# Patient Record
Sex: Female | Born: 1950 | Race: White | Hispanic: No | Marital: Married | Smoking: Former smoker
Health system: Southern US, Community
[De-identification: ages and names within clinical notes are randomized; demographics above are authoritative.]

## PROBLEM LIST (undated history)

## (undated) DIAGNOSIS — M1990 Unspecified osteoarthritis, unspecified site: Secondary | ICD-10-CM

## (undated) DIAGNOSIS — T7840XA Allergy, unspecified, initial encounter: Secondary | ICD-10-CM

## (undated) DIAGNOSIS — I10 Essential (primary) hypertension: Secondary | ICD-10-CM

## (undated) DIAGNOSIS — R42 Dizziness and giddiness: Secondary | ICD-10-CM

## (undated) DIAGNOSIS — E079 Disorder of thyroid, unspecified: Secondary | ICD-10-CM

## (undated) HISTORY — DX: Allergy, unspecified, initial encounter: T78.40XA

## (undated) HISTORY — DX: Unspecified osteoarthritis, unspecified site: M19.90

## (undated) HISTORY — DX: Essential (primary) hypertension: I10

## (undated) HISTORY — PX: GASTRIC BYPASS: SHX52

## (undated) HISTORY — DX: Disorder of thyroid, unspecified: E07.9

## (undated) HISTORY — DX: Dizziness and giddiness: R42

## (undated) HISTORY — PX: TUBAL LIGATION: SHX77

## (undated) HISTORY — PX: CHOLECYSTECTOMY: SHX55

## (undated) HISTORY — PX: FOOT SURGERY: SHX648

---

## 2001-07-19 ENCOUNTER — Ambulatory Visit (HOSPITAL_BASED_OUTPATIENT_CLINIC_OR_DEPARTMENT_OTHER): Admission: RE | Admit: 2001-07-19 | Discharge: 2001-07-19 | Payer: Self-pay | Admitting: Family Medicine

## 2003-05-22 ENCOUNTER — Ambulatory Visit (HOSPITAL_COMMUNITY): Admission: RE | Admit: 2003-05-22 | Discharge: 2003-05-22 | Payer: Self-pay | Admitting: Family Medicine

## 2003-06-06 ENCOUNTER — Observation Stay (HOSPITAL_COMMUNITY): Admission: RE | Admit: 2003-06-06 | Discharge: 2003-06-07 | Payer: Self-pay | Admitting: General Surgery

## 2004-09-24 IMAGING — RF DG CHOLANGIOGRAM OPERATIVE
1 series · 4 of 4 positions shown · non-contrast
Comparison: none

CLINICAL DATA: gallstones
 OPERATIVE CHOLANGIOGRAM
 Common duct is normal in caliber without evidence of retained calculi.  There is drainage into the duodenum.  No extravasation is seen.  
 IMPRESSION
 No evidence of common bile duct obstruction or choledocholithiasis.

[Series 1: run · 4 of 4 slices shown]
[im 1/4]
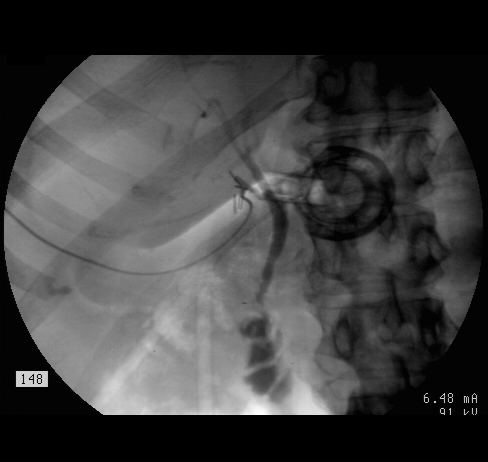
[im 2/4]
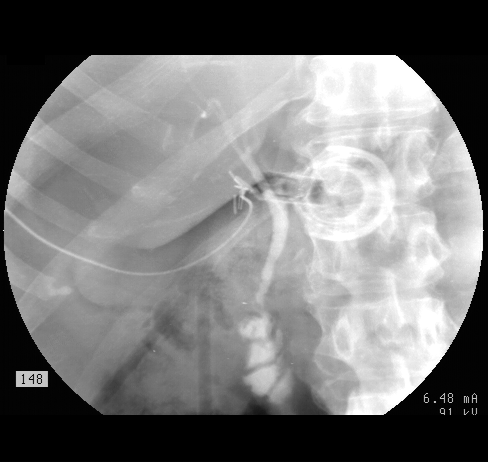
[im 3/4]
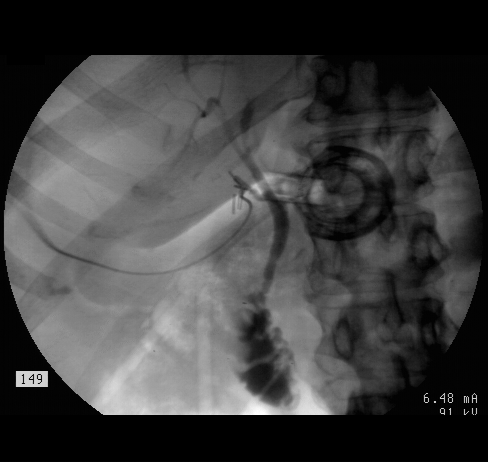
[im 4/4]
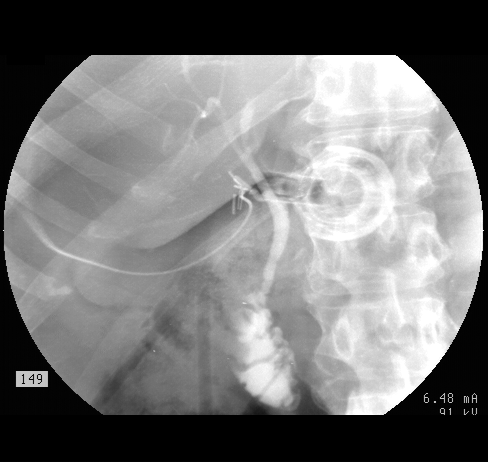

[4 of 4 positions shown; findings below may reference images not displayed]

## 2012-05-13 ENCOUNTER — Other Ambulatory Visit: Payer: Self-pay | Admitting: Family Medicine

## 2012-05-13 ENCOUNTER — Other Ambulatory Visit (HOSPITAL_COMMUNITY)
Admission: RE | Admit: 2012-05-13 | Discharge: 2012-05-13 | Disposition: A | Discharge status: Home / Self Care | Payer: Self-pay | Source: Ambulatory Visit | Attending: Family Medicine | Admitting: Family Medicine

## 2012-05-13 DIAGNOSIS — Z0000 Encounter for general adult medical examination without abnormal findings: Secondary | ICD-10-CM | POA: Insufficient documentation

## 2013-08-25 ENCOUNTER — Ambulatory Visit (INDEPENDENT_AMBULATORY_CARE_PROVIDER_SITE_OTHER): Payer: No Typology Code available for payment source | Admitting: Podiatry

## 2013-08-25 ENCOUNTER — Ambulatory Visit (INDEPENDENT_AMBULATORY_CARE_PROVIDER_SITE_OTHER): Payer: No Typology Code available for payment source

## 2013-08-25 ENCOUNTER — Ambulatory Visit: Payer: Self-pay | Admitting: Podiatry

## 2013-08-25 ENCOUNTER — Encounter: Payer: Self-pay | Admitting: Podiatry

## 2013-08-25 VITALS — BP 125/77 | HR 80 | Resp 15 | Ht 68.0 in | Wt 250.0 lb

## 2013-08-25 DIAGNOSIS — M79673 Pain in unspecified foot: Secondary | ICD-10-CM

## 2013-08-25 DIAGNOSIS — M722 Plantar fascial fibromatosis: Secondary | ICD-10-CM

## 2013-08-25 DIAGNOSIS — M79609 Pain in unspecified limb: Secondary | ICD-10-CM

## 2013-08-25 MED ORDER — TRIAMCINOLONE ACETONIDE 10 MG/ML IJ SUSP
10.0000 mg | Freq: Once | INTRAMUSCULAR | Status: AC
  Administered 2013-08-25: 10 mg

## 2013-08-25 NOTE — Progress Notes (Signed)
   Subjective:    Patient ID: Janet Leblanc, female    DOB: [birth year], 63 y.o.   MRN: [id number]  HPI Comments: Pt complains of arch and heel pain for about 2 month, once she began working again.  Pt states she stands 6 to 10 hours/day.  Pt states the pain is a aching, to throbbing, and worsens after rest and at the end of the day.  Pt soaks feet in Epsom salt soaks, OTC inserts, and a arch brace.     Review of Systems  HENT: Positive for sinus pressure and tinnitus.   Allergic/Immunologic: Positive for environmental allergies.  All other systems reviewed and are negative.      Objective:   Physical Exam        Assessment & Plan:

## 2013-08-25 NOTE — Progress Notes (Signed)
Subjective:     Patient ID: Janet Leblanc, female   DOB: 07-Feb-1951, 63 y.o.   MRN: [id number]  HPI patient works at a full-time weightbearing job now and is developed pain in her heels but is now spreading to her arches. Has tried soaks and over-the-counter insoles and arch brace   Review of Systems  All other systems reviewed and are negative.      Objective:   Physical Exam  Nursing note and vitals reviewed. Constitutional: She is oriented to person, place, and time.  Cardiovascular: Intact distal pulses.   Musculoskeletal: Normal range of motion.  Neurological: She is oriented to person, place, and time.  Skin: Skin is warm.   neurovascular status intact with muscle strength adequate and range of motion within normal limits. Patient has intense discomfort plantar aspect heel left over right with inflammation at the insertion of the tendon into the calcaneus     Assessment:     Acute plantar fasciitis both heels left worse than right    Plan:     H&P and x-rays reviewed and injected the plantar fascia both heels 3 mg Kenalog 5 mg Xylocaine Marcaine mixture and applied fascially brace left. Placed on Voltaren 75 mg twice a day

## 2013-09-01 ENCOUNTER — Ambulatory Visit (INDEPENDENT_AMBULATORY_CARE_PROVIDER_SITE_OTHER): Payer: No Typology Code available for payment source | Admitting: Podiatry

## 2013-09-01 ENCOUNTER — Encounter: Payer: Self-pay | Admitting: Podiatry

## 2013-09-01 VITALS — BP 102/83 | HR 87 | Resp 16

## 2013-09-01 DIAGNOSIS — M722 Plantar fascial fibromatosis: Secondary | ICD-10-CM

## 2013-09-01 MED ORDER — TRIAMCINOLONE ACETONIDE 10 MG/ML IJ SUSP
10.0000 mg | Freq: Once | INTRAMUSCULAR | Status: AC
  Administered 2013-09-01: 10 mg

## 2013-09-01 NOTE — Progress Notes (Signed)
Subjective:     Patient ID: Janet Leblanc, female   DOB: 1950-10-01, 63 y.o.   MRN: [id number]  HPI patient presents stating I'm still having discomfort on the left heel but I have improved from previous visit   Review of Systems     Objective:   Physical Exam Neurovascular status intact with discomfort plantar aspect left heel that has improved from previously but is still present with the right being much better    Assessment:     Improve plantar fasciitis with pain left    Plan:     Reinjected the plantar fascial left 3 mg Kenalog 5 mg Xylocaine Marcaine mixture and gave instructions on physical therapy

## 2013-10-26 ENCOUNTER — Ambulatory Visit (INDEPENDENT_AMBULATORY_CARE_PROVIDER_SITE_OTHER): Payer: No Typology Code available for payment source | Admitting: Podiatry

## 2013-10-26 ENCOUNTER — Ambulatory Visit (INDEPENDENT_AMBULATORY_CARE_PROVIDER_SITE_OTHER): Payer: No Typology Code available for payment source

## 2013-10-26 VITALS — BP 165/91 | HR 58 | Resp 16

## 2013-10-26 DIAGNOSIS — M779 Enthesopathy, unspecified: Secondary | ICD-10-CM

## 2013-10-26 MED ORDER — TRIAMCINOLONE ACETONIDE 10 MG/ML IJ SUSP
10.0000 mg | Freq: Once | INTRAMUSCULAR | Status: AC
  Administered 2013-10-26: 10 mg

## 2013-10-27 NOTE — Progress Notes (Signed)
Subjective:     Patient ID: Janet Leblanc, female   DOB: Jan 06, 1951, 63 y.o.   MRN: [id number]  HPI patient states I developed a lot of pain on top of my right foot and do not remember a specific injury   Review of Systems     Objective:   Physical Exam Neurovascular status found to be intact with inflammation and pain around the third MPJ right that has been difficult for the patient to walk with    Assessment:     Probable capsulitis of the right third MPJ    Plan:     H&P and x-ray performed and today I recommended aspiration. Explained the risk of procedure and did proximal nerve block and then aspirated the joint giving out a small amount of clear fluid and injected with half cc of dexamethasone Kenalog and applied thick plantar pad. Explained procedure and reappoint in 3 weeks to recheck

## 2013-11-10 ENCOUNTER — Ambulatory Visit (INDEPENDENT_AMBULATORY_CARE_PROVIDER_SITE_OTHER): Payer: No Typology Code available for payment source | Admitting: Family Medicine

## 2013-11-10 VITALS — BP 124/98 | HR 76 | Temp 97.5°F | Resp 16 | Ht 66.0 in | Wt 266.4 lb

## 2013-11-10 DIAGNOSIS — I10 Essential (primary) hypertension: Secondary | ICD-10-CM

## 2013-11-10 DIAGNOSIS — H9319 Tinnitus, unspecified ear: Secondary | ICD-10-CM

## 2013-11-10 DIAGNOSIS — H9313 Tinnitus, bilateral: Secondary | ICD-10-CM

## 2013-11-10 DIAGNOSIS — R110 Nausea: Secondary | ICD-10-CM

## 2013-11-10 LAB — CBC WITH DIFFERENTIAL/PLATELET
BASOS PCT: 0 % (ref 0–1)
Basophils Absolute: 0 10*3/uL (ref 0.0–0.1)
EOS ABS: 0.2 10*3/uL (ref 0.0–0.7)
EOS PCT: 2 % (ref 0–5)
HCT: 39.9 % (ref 36.0–46.0)
Hemoglobin: 13.7 g/dL (ref 12.0–15.0)
Lymphocytes Relative: 26 % (ref 12–46)
Lymphs Abs: 2.5 10*3/uL (ref 0.7–4.0)
MCH: 28.7 pg (ref 26.0–34.0)
MCHC: 34.3 g/dL (ref 30.0–36.0)
MCV: 83.6 fL (ref 78.0–100.0)
MONOS PCT: 10 % (ref 3–12)
Monocytes Absolute: 1 10*3/uL (ref 0.1–1.0)
NEUTROS PCT: 62 % (ref 43–77)
Neutro Abs: 6.1 10*3/uL (ref 1.7–7.7)
PLATELETS: 375 10*3/uL (ref 150–400)
RBC: 4.77 MIL/uL (ref 3.87–5.11)
RDW: 14.5 % (ref 11.5–15.5)
WBC: 9.8 10*3/uL (ref 4.0–10.5)

## 2013-11-10 MED ORDER — HYDROCHLOROTHIAZIDE 25 MG PO TABS: 25.0000 mg | ORAL_TABLET | Freq: Every day | ORAL | Status: DC

## 2013-11-10 MED ORDER — ONDANSETRON 4 MG PO TBDP: 4.0000 mg | ORAL_TABLET | Freq: Three times a day (TID) | ORAL | Status: DC | PRN

## 2013-11-10 MED ORDER — ONDANSETRON 4 MG PO TBDP
8.0000 mg | ORAL_TABLET | Freq: Once | ORAL | Status: AC
  Administered 2013-11-10: 8 mg via ORAL

## 2013-11-10 NOTE — Progress Notes (Signed)
   Subjective:    Patient ID: Janet Leblanc, female    DOB: [birth year], 63 y.o.   MRN: [id number]  HPI This is a pleasant 63 yo female who presents today with ringing in her ears. This is a low pitched ringing that is different from vertigo that she occasionally has. This has been going on for several days and was worse last night. Not sure what may have brought it on. She works at [employer]. Has always been sensitive to noises.   Patient was previously seen at [facility], but has changed her insurance and it is not accepted there.   She has had elevated BP off and on without treatment. She has a strong family history of CAD/stroke and has had cardiac work up in the past that was negative.   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 She used to weight 400 pounds, is currently 266. She reports she has gained some of her weight back.   Has had many years of insomnia. Takes OTC sleep aid every night. Drinks unsweetened tea and Dr. Malachi Bonds until bedtime. Watches TV before bed. Goes to sleep between 11-1 and works at 4 am.    Review of Systems No headache, some nausea, no vomiting, no sensation of spinning, no ear pain/drainage, no runny nose, no sore throat, no fever/chills, no cough, no SOB, no weakness    Objective:   Physical Exam  Vitals reviewed. Constitutional: She is oriented to person, place, and time. She appears well-developed and well-nourished.  HENT:  Head: Normocephalic and atraumatic.  Right Ear: Tympanic membrane, external ear and ear canal normal.  Left Ear: Tympanic membrane, external ear and ear canal normal.  Nose: Nose normal.  Mouth/Throat: Oropharynx is clear and moist.  Eyes: Conjunctivae and EOM are normal. Pupils are equal, round, and reactive to light. Right eye exhibits no discharge. Left eye exhibits no discharge. No scleral icterus.  Neck: Normal range of motion. Neck supple.  Cardiovascular: Normal rate, regular rhythm and normal heart sounds.     Pulmonary/Chest: Effort normal and breath sounds normal.  Musculoskeletal: Normal range of motion. She exhibits no edema.  Lymphadenopathy:    She has no cervical adenopathy.  Neurological: She is alert and oriented to person, place, and time. She has normal reflexes. No cranial nerve deficit.  Skin: Skin is warm and dry.  Psychiatric: She has a normal mood and affect. Her behavior is normal. Judgment and thought content normal.      Assessment & Plan:  1. Essential hypertension - CBC with Differential - Comprehensive metabolic panel - Lipid panel - hydrochlorothiazide (HYDRODIURIL) 25 MG tablet; Take 1 tablet (25 mg total) by mouth daily.  Dispense: 30 tablet; Refill: 2  2. Ringing in ears, bilateral - Comprehensive metabolic panel  3. Nausea - ondansetron (ZOFRAN-ODT) disintegrating tablet 8 mg; Take 2 tablets (8 mg total) by mouth once. - ondansetron (ZOFRAN ODT) 4 MG disintegrating tablet; Take 1 tablet (4 mg total) by mouth every 8 (eight) hours as needed for nausea or vomiting.  Dispense: 20 tablet; Refill: 0  -Follow up BP in 1 month. Needs CPE. Offered patient appointment at the [facility]. We are not in her network and her co pay will be higher, she is trying to decided who to establish care with.  -provided written and verbal information regarding tinnitus/HTN/insomnia  Elby Beck, FNP-BC  [facility]  11/10/2013 2:48 PM

## 2013-11-10 NOTE — Patient Instructions (Addendum)
Can try over the counter melatonin for sleep  Tinnitus Sounds you hear in your ears and coming from within the ear is called tinnitus. This can be a symptom of many ear disorders. It is often associated with hearing loss.  Tinnitus can be seen with:  Infections.  Ear blockages such as wax buildup.  Meniere's disease.  Ear damage.  Inherited.  Occupational causes. While irritating, it is not usually a threat to health. When the cause of the tinnitus is wax, infection in the middle ear, or foreign body it is easily treated. Hearing loss will usually be reversible.  TREATMENT  When treating the underlying cause does not get rid of tinnitus, it may be necessary to get rid of the unwanted sound by covering it up with more pleasant background noises. This may include music, the radio etc. There are tinnitus maskers which can be worn which produce background noise to cover up the tinnitus. Avoid all medications which tend to make tinnitus worse such as alcohol, caffeine, aspirin, and nicotine. There are many soothing background tapes such as rain, ocean, thunderstorms, etc. These soothing sounds help with sleeping or resting. Keep all follow-up appointments and referrals. This is important to identify the cause of the problem. It also helps avoid complications, impaired hearing, disability, or chronic pain. Document Released: 03/24/2005 Document Revised: 06/16/2011 Document Reviewed: 11/10/2007 [facility] Patient Information 2015 [place], [place]. This information is not intended to replace advice given to you by your health care provider. Make sure you discuss any questions you have with your health care provider. Hypertension Hypertension, commonly called high blood pressure, is when the force of blood pumping through your arteries is too strong. Your arteries are the blood vessels that carry blood from your heart throughout your body. A blood pressure reading consists of a higher number over a  lower number, such as 110/72. The higher number (systolic) is the pressure inside your arteries when your heart pumps. The lower number (diastolic) is the pressure inside your arteries when your heart relaxes. Ideally you want your blood pressure below 120/80. Hypertension forces your heart to work harder to pump blood. Your arteries may become narrow or stiff. Having hypertension puts you at risk for heart disease, stroke, and other problems.  RISK FACTORS Some risk factors for high blood pressure are controllable. Others are not.  Risk factors you cannot control include:   Race. You may be at higher risk if you are African American.  Age. Risk increases with age.  Gender. Men are at higher risk than women before age 28 years. After age 76, women are at higher risk than men. Risk factors you can control include:  Not getting enough exercise or physical activity.  Being overweight.  Getting too much fat, sugar, calories, or salt in your diet.  Drinking too much alcohol. SIGNS AND SYMPTOMS Hypertension does not usually cause signs or symptoms. Extremely high blood pressure (hypertensive crisis) may cause headache, anxiety, shortness of breath, and nosebleed. DIAGNOSIS  To check if you have hypertension, your health care provider will measure your blood pressure while you are seated, with your arm held at the level of your heart. It should be measured at least twice using the same arm. Certain conditions can cause a difference in blood pressure between your right and left arms. A blood pressure reading that is higher than normal on one occasion does not mean that you need treatment. If one blood pressure reading is high, ask your health care provider about  having it checked again. TREATMENT  Treating high blood pressure includes making lifestyle changes and possibly taking medicine. Living a healthy lifestyle can help lower high blood pressure. You may need to change some of your  habits. Lifestyle changes may include:  Following the DASH diet. This diet is high in fruits, vegetables, and whole grains. It is low in salt, red meat, and added sugars.  Getting at least 2 hours of brisk physical activity every week.  Losing weight if necessary.  Not smoking.  Limiting alcoholic beverages.  Learning ways to reduce stress. If lifestyle changes are not enough to get your blood pressure under control, your health care provider may prescribe medicine. You may need to take more than one. Work closely with your health care provider to understand the risks and benefits. HOME CARE INSTRUCTIONS  Have your blood pressure rechecked as directed by your health care provider.   Take medicines only as directed by your health care provider. Follow the directions carefully. Blood pressure medicines must be taken as prescribed. The medicine does not work as well when you skip doses. Skipping doses also puts you at risk for problems.   Do not smoke.   Monitor your blood pressure at home as directed by your health care provider. SEEK MEDICAL CARE IF:   You think you are having a reaction to medicines taken.  You have recurrent headaches or feel dizzy.  You have swelling in your ankles.  You have trouble with your vision. SEEK IMMEDIATE MEDICAL CARE IF:  You develop a severe headache or confusion.  You have unusual weakness, numbness, or feel faint.  You have severe chest or abdominal pain.  You vomit repeatedly.  You have trouble breathing. MAKE SURE YOU:   Understand these instructions.  Will watch your condition.  Will get help right away if you are not doing well or get worse. Document Released: 03/24/2005 Document Revised: 08/08/2013 Document Reviewed: 01/14/2013 [facility] Patient Information 2015 [place], [place]. This information is not intended to replace advice given to you by your health care provider. Make sure you discuss any questions you have with  your health care provider. Insomnia Insomnia is frequent trouble falling and/or staying asleep. Insomnia can be a long term problem or a short term problem. Both are common. Insomnia can be a short term problem when the wakefulness is related to a certain stress or worry. Long term insomnia is often related to ongoing stress during waking hours and/or poor sleeping habits. Overtime, sleep deprivation itself can make the problem worse. Every little thing feels more severe because you are overtired and your ability to cope is decreased. CAUSES   Stress, anxiety, and depression.  Poor sleeping habits.  Distractions such as TV in the bedroom.  Naps close to bedtime.  Engaging in emotionally charged conversations before bed.  Technical reading before sleep.  Alcohol and other sedatives. They may make the problem worse. They can hurt normal sleep patterns and normal dream activity.  Stimulants such as caffeine for several hours prior to bedtime.  Pain syndromes and shortness of breath can cause insomnia.  Exercise late at night.  Changing time zones may cause sleeping problems (jet lag). It is sometimes helpful to have someone observe your sleeping patterns. They should look for periods of not breathing during the night (sleep apnea). They should also look to see how long those periods last. If you live alone or observers are uncertain, you can also be observed at a sleep clinic where your  sleep patterns will be professionally monitored. Sleep apnea requires a checkup and treatment. Give your caregivers your medical history. Give your caregivers observations your family has made about your sleep.  SYMPTOMS   Not feeling rested in the morning.  Anxiety and restlessness at bedtime.  Difficulty falling and staying asleep. TREATMENT   Your caregiver may prescribe treatment for an underlying medical disorders. Your caregiver can give advice or help if you are using alcohol or other drugs for  self-medication. Treatment of underlying problems will usually eliminate insomnia problems.  Medications can be prescribed for short time use. They are generally not recommended for lengthy use.  Over-the-counter sleep medicines are not recommended for lengthy use. They can be habit forming.  You can promote easier sleeping by making lifestyle changes such as:  Using relaxation techniques that help with breathing and reduce muscle tension.  Exercising earlier in the day.  Changing your diet and the time of your last meal. No night time snacks.  Establish a regular time to go to bed.  Counseling can help with stressful problems and worry.  Soothing music and white noise may be helpful if there are background noises you cannot remove.  Stop tedious detailed work at least one hour before bedtime. HOME CARE INSTRUCTIONS   Keep a diary. Inform your caregiver about your progress. This includes any medication side effects. See your caregiver regularly. Take note of:  Times when you are asleep.  Times when you are awake during the night.  The quality of your sleep.  How you feel the next day. This information will help your caregiver care for you.  Get out of bed if you are still awake after 15 minutes. Read or do some quiet activity. Keep the lights down. Wait until you feel sleepy and go back to bed.  Keep regular sleeping and waking hours. Avoid naps.  Exercise regularly.  Avoid distractions at bedtime. Distractions include watching television or engaging in any intense or detailed activity like attempting to balance the household checkbook.  Develop a bedtime ritual. Keep a familiar routine of bathing, brushing your teeth, climbing into bed at the same time each night, listening to soothing music. Routines increase the success of falling to sleep faster.  Use relaxation techniques. This can be using breathing and muscle tension release routines. It can also include visualizing  peaceful scenes. You can also help control troubling or intruding thoughts by keeping your mind occupied with boring or repetitive thoughts like the old concept of counting sheep. You can make it more creative like imagining planting one beautiful flower after another in your backyard garden.  During your day, work to eliminate stress. When this is not possible use some of the previous suggestions to help reduce the anxiety that accompanies stressful situations. MAKE SURE YOU:   Understand these instructions.  Will watch your condition.  Will get help right away if you are not doing well or get worse. Document Released: 03/21/2000 Document Revised: 06/16/2011 Document Reviewed: 04/21/2007 [facility] Patient Information 2015 [place], [place]. This information is not intended to replace advice given to you by your health care provider. Make sure you discuss any questions you have with your health care provider.

## 2013-11-11 LAB — COMPREHENSIVE METABOLIC PANEL
ALK PHOS: 139 U/L — AB (ref 39–117)
ALT: 13 U/L (ref 0–35)
AST: 15 U/L (ref 0–37)
Albumin: 3.9 g/dL (ref 3.5–5.2)
BILIRUBIN TOTAL: 0.3 mg/dL (ref 0.2–1.2)
BUN: 11 mg/dL (ref 6–23)
CO2: 26 meq/L (ref 19–32)
CREATININE: 0.87 mg/dL (ref 0.50–1.10)
Calcium: 8.9 mg/dL (ref 8.4–10.5)
Chloride: 103 mEq/L (ref 96–112)
GLUCOSE: 111 mg/dL — AB (ref 70–99)
Potassium: 4.5 mEq/L (ref 3.5–5.3)
SODIUM: 136 meq/L (ref 135–145)
TOTAL PROTEIN: 6.5 g/dL (ref 6.0–8.3)

## 2013-11-11 LAB — LIPID PANEL
CHOL/HDL RATIO: 5.8 ratio
CHOLESTEROL: 203 mg/dL — AB (ref 0–200)
HDL: 35 mg/dL — ABNORMAL LOW (ref >39)
LDL CALC: 126 mg/dL — AB (ref 0–99)
TRIGLYCERIDES: 212 mg/dL — AB (ref <150)
VLDL: 42 mg/dL — AB (ref 0–40)

## 2013-11-17 ENCOUNTER — Encounter: Payer: Self-pay | Admitting: Podiatry

## 2013-11-17 ENCOUNTER — Ambulatory Visit (INDEPENDENT_AMBULATORY_CARE_PROVIDER_SITE_OTHER): Payer: No Typology Code available for payment source | Admitting: Podiatry

## 2013-11-17 VITALS — BP 133/67 | HR 79 | Resp 16

## 2013-11-17 DIAGNOSIS — M779 Enthesopathy, unspecified: Secondary | ICD-10-CM

## 2013-11-18 NOTE — Progress Notes (Signed)
Subjective:     Patient ID: Janet Leblanc, female   DOB: 08/04/1950, 63 y.o.   MRN: [id number]  HPI patient presents with pain in the right forefoot third metatarsal stating he feels much much better from previous visit   Review of Systems     Objective:   Physical Exam Neurovascular status intact with significant diminishment of discomfort third metatarsophalangeal joint right    Assessment:     Improved capsulitis right third MPJ    Plan:     Instructed on physical therapy anti-inflammatories supportive shoes and if symptoms persist we may need to get her a new orthotic

## 2013-12-19 ENCOUNTER — Ambulatory Visit (INDEPENDENT_AMBULATORY_CARE_PROVIDER_SITE_OTHER): Payer: No Typology Code available for payment source | Admitting: Family Medicine

## 2013-12-19 VITALS — BP 142/88 | HR 78 | Temp 97.9°F | Resp 18 | Wt 263.6 lb

## 2013-12-19 DIAGNOSIS — I10 Essential (primary) hypertension: Secondary | ICD-10-CM

## 2013-12-19 NOTE — Progress Notes (Signed)
   Subjective:    Patient ID: Janet Leblanc, female    DOB: 1950-08-05, 63 y.o.   MRN: [id number]  HPI Patient presents today for follow up of htn, ringing in ears. Her ringing is intermittent and she find it manageable, she can not identify any triggers. She was seen at podiatry and had a good BP reading. She is tolerating hctz well without side effects. Nausea less. Has lost 3 pounds since last visit, has decreased Dr. Malachi Bonds consumption and is drinking more water. Is trying to walk more often.   BP Readings from Last 3 Encounters:  12/19/13 142/88  11/17/13 133/67  11/10/13 124/98     She would not like a flu shot today.  Review of Systems No fever, no chest pain, no SOB    Objective:   Physical Exam  Vitals reviewed. Constitutional: She is oriented to person, place, and time. She appears well-developed and well-nourished.  Eyes: Conjunctivae and EOM are normal.  Neck: Normal range of motion. Neck supple.  Cardiovascular: Normal rate, regular rhythm and normal heart sounds.   Pulmonary/Chest: Effort normal and breath sounds normal.  Musculoskeletal: Normal range of motion.  Neurological: She is oriented to person, place, and time.  Skin: Skin is warm and dry.  Psychiatric: She has a normal mood and affect. Her behavior is normal. Judgment and thought content normal.      Assessment & Plan:  1. Essential hypertension -continue HCTZ- patient would like a prescription sent to her mail order pharmacy- she will let me know which one -Encouraged patient to follow up in 3 months at appointment center for CPE -Encouraged continued weight loss and increased activity Elby Beck, FNP-BC  [facility]  12/21/2013 12:43 PM

## 2013-12-20 ENCOUNTER — Telehealth: Payer: Self-pay

## 2013-12-20 DIAGNOSIS — I10 Essential (primary) hypertension: Secondary | ICD-10-CM

## 2013-12-20 NOTE — Telephone Encounter (Signed)
Patient requesting a refill on her 'Hydrochlorothiazide 25 mg". Per patient [facility] Patients call back number [phone]

## 2013-12-21 MED ORDER — HYDROCHLOROTHIAZIDE 25 MG PO TABS: 25.0000 mg | ORAL_TABLET | Freq: Every day | ORAL | Status: DC

## 2013-12-21 NOTE — Telephone Encounter (Signed)
Refill sent per protocol

## 2014-06-03 ENCOUNTER — Ambulatory Visit (INDEPENDENT_AMBULATORY_CARE_PROVIDER_SITE_OTHER): Payer: Managed Care, Other (non HMO) | Admitting: Family Medicine

## 2014-06-03 VITALS — BP 130/80 | HR 67 | Temp 97.5°F | Ht 66.0 in | Wt 245.0 lb

## 2014-06-03 DIAGNOSIS — H9313 Tinnitus, bilateral: Secondary | ICD-10-CM | POA: Diagnosis not present

## 2014-06-03 DIAGNOSIS — R42 Dizziness and giddiness: Secondary | ICD-10-CM | POA: Diagnosis not present

## 2014-06-03 MED ORDER — CLONAZEPAM 0.5 MG PO TABS: 0.5000 mg | ORAL_TABLET | Freq: Two times a day (BID) | ORAL | Status: DC | PRN

## 2014-06-03 NOTE — Progress Notes (Signed)
Subjective:  This chart was scribed for Dr. Delman Cheadle, MD by Erling Conte, ED Scribe. This patient was seen in Room 11 and the patient's care was started at 11:51 AM.   Patient ID: Janet Leblanc, female    DOB: 12/28/1950, 64 y.o.   MRN: [id number]  Chief Complaint  Patient presents with  . Dizziness    x's 2 days    HPI HPI Comments: Janet Leblanc is a 64 y.o. female who presents to the [facility] complaining of episodic dizziness for 2 days.  The episodes usually lasts constantly for several days. She is having associated tinnitus which is chronic problem along with vertigo. Pt was seen 6 months ago for tinnitus as as has a personal history of vertigo. She notes that usually occurs about 3-4 times a year and her last episode was 4 weeks ago. She denies any h/o Menire's diease. Pt takes Meclozine 25 mg and she took 2-3 pills last night. She has also been taking Hydrodiuril for 4 months for her HTN. She denies any increase in vertigo and tinnitus from the medication. No h/o seasonal allergies or recent nasal spray use. Pt states that lying down in a dark room is the only thing that seems to help with her symptoms. She states that the dizziness does not go away from staying still for several minutes. No h/oi migraines. No h/o MRIs of her brain. Pt denies any triggers for her tinnitus or vertigo. She has never seen a neurologist. No h/o sedative use. She notes she has difficulty getting to sleep at night. No h/o chronic sinus infections. She denies any vision changes, cough, nausea, sinus congestion, decreased hearing or ear pain.     No past medical history on file. Current Outpatient Prescriptions on File Prior to Visit  Medication Sig Dispense Refill  . hydrochlorothiazide (HYDRODIURIL) 25 MG tablet Take 1 tablet (25 mg total) by mouth daily. 90 tablet 1   No current facility-administered medications on file prior to visit.   Allergies  Allergen Reactions  .  Penicillins Swelling    Review of Systems  HENT: Positive for tinnitus. Negative for congestion, ear pain and hearing loss.   Eyes: Negative for visual disturbance.  Respiratory: Positive for cough.   Gastrointestinal: Positive for nausea. Negative for vomiting.  Neurological: Positive for dizziness.      Vitals: BP 130/80 mmHg  Pulse 67  Temp(Src) 97.5 F (36.4 C) (Oral)  Ht 5\' 6"  (1.676 m)  Wt 245 lb (111.131 kg)  BMI 39.56 kg/m2  SpO2 97%  Objective:   Physical Exam  Constitutional: She is oriented to person, place, and time. She appears well-developed and well-nourished. No distress.  HENT:  Head: Normocephalic and atraumatic.  Right Ear: Tympanic membrane and ear canal normal. No decreased hearing is noted.  Left Ear: Tympanic membrane and ear canal normal. No decreased hearing is noted.  Nose: Nose normal.  Mouth/Throat: Oropharynx is clear and moist and mucous membranes are normal.  Normal hearing to soft sounds  Eyes: Conjunctivae and EOM are normal.  Neck: Neck supple. No tracheal deviation present. No thyroid mass and no thyromegaly present.  Cardiovascular: Normal rate, regular rhythm, S1 normal, S2 normal and normal heart sounds.   Pulmonary/Chest: Effort normal. No respiratory distress.  Musculoskeletal: Normal range of motion.  Lymphadenopathy:    She has no cervical adenopathy.  Neurological: She is alert and oriented to person, place, and time.  Skin: Skin is warm and  dry.  Psychiatric: She has a normal mood and affect. Her behavior is normal.  Nursing note and vitals reviewed.      Assessment & Plan:   Intermittent vertigo - Plan: Ambulatory referral to ENT  Tinnitus, bilateral - Plan: Ambulatory referral to ENT Atypical sxs so likely NOT BPPV since vertigo lasts continually for sev days - could be vertiginous migraine, vs recurrent vestibular neuritis, vs Meniers or others -but no hearing loss noted.  Cont meclizine 50mg  every 6 hrs in addition to  zofran and klonopin bid.  May need formal hearing eval and brain imaging +/- imaging of middle ears w/ MRI..  Meds ordered this encounter  Medications  . clonazePAM (KLONOPIN) 0.5 MG tablet    Sig: Take 1 tablet (0.5 mg total) by mouth 2 (two) times daily as needed (vertigo).    Dispense:  20 tablet    Refill:  1    I personally performed the services described in this documentation, which was scribed in my presence. The recorded information has been reviewed and considered, and addended by me as needed.  Delman Cheadle, MD MPH

## 2014-06-03 NOTE — Patient Instructions (Signed)
Due to your recurrent symptoms lasting days, you definitely need further evaluation as this is not typical.  This could be vestibular/vertiginous migraine, recurrent vestibular neuritis, or Meniere's disease (though fortunately you have not had hearing loss that you have noted. Continue taking the meclizine 2 tabs every 6 hours in addition to the zofran and add on the clonazepam twice a day as needed. You will need hearing test and will probably need an MRI of your brain and inner ears.   Vertigo Vertigo means you feel like you or your surroundings are moving when they are not. Vertigo can be dangerous if it occurs when you are at work, driving, or performing difficult activities.  CAUSES  Vertigo occurs when there is a conflict of signals sent to your brain from the visual and sensory systems in your body. There are many different causes of vertigo, including:  Infections, especially in the inner ear.  A bad reaction to a drug or misuse of alcohol and medicines.  Withdrawal from drugs or alcohol.  Rapidly changing positions, such as lying down or rolling over in bed.  A migraine headache.  Decreased blood flow to the brain.  Increased pressure in the brain from a head injury, infection, tumor, or bleeding. SYMPTOMS  You may feel as though the world is spinning around or you are falling to the ground. Because your balance is upset, vertigo can cause nausea and vomiting. You may have involuntary eye movements (nystagmus). DIAGNOSIS  Vertigo is usually diagnosed by physical exam. If the cause of your vertigo is unknown, your caregiver may perform imaging tests, such as an MRI scan (magnetic resonance imaging). TREATMENT  Most cases of vertigo resolve on their own, without treatment. Depending on the cause, your caregiver may prescribe certain medicines. If your vertigo is related to body position issues, your caregiver may recommend movements or procedures to correct the problem. In rare  cases, if your vertigo is caused by certain inner ear problems, you may need surgery. HOME CARE INSTRUCTIONS   Follow your caregiver's instructions.  Avoid driving.  Avoid operating heavy machinery.  Avoid performing any tasks that would be dangerous to you or others during a vertigo episode.  Tell your caregiver if you notice that certain medicines seem to be causing your vertigo. Some of the medicines used to treat vertigo episodes can actually make them worse in some people. SEEK IMMEDIATE MEDICAL CARE IF:   Your medicines do not relieve your vertigo or are making it worse.  You develop problems with talking, walking, weakness, or using your arms, hands, or legs.  You develop severe headaches.  Your nausea or vomiting continues or gets worse.  You develop visual changes.  A family member notices behavioral changes.  Your condition gets worse. MAKE SURE YOU:  Understand these instructions.  Will watch your condition.  Will get help right away if you are not doing well or get worse. Document Released: 01/01/2005 Document Revised: 06/16/2011 Document Reviewed: 10/10/2010 [facility] Patient Information 2015 [place], [place]. This information is not intended to replace advice given to you by your health care provider. Make sure you discuss any questions you have with your health care provider. Meniere Disease Meniere disease is an inner ear disorder. It causes attacks of a spinning sensation (vertigo) and ringing in the ear (tinnitus). It also causes hearing loss and a sensation of fullness or pressure in your ear.  Meniere disease is lifelong. It may get worse over time. Symptoms usually begin in one ear  but may eventually affect both ears.  CAUSES Meniere disease is caused by having too much of the fluid that is in your inner ear (endolymph). When endolymph builds up in your inner ear, it affects the nerves that control balance and hearing. The reason for the endolymph buildup is  not known. Possible causes include:  Allergy.  An abnormal reaction of the body's defense system (autoimmune disease).  Viral infection of the inner ear.  Head injury. RISK FACTORS  Age older than 40 years.  Family history of Meniere disease.  History of autoimmune disease.  History of migraine headaches. SIGNS AND SYMPTOMS Symptoms of Meniere disease can come and go and may last for up to 4 hours at a time. Symptoms usually start in one ear and may become more frequent and eventually involve both ears. Symptoms can include:  Fullness and pressure in your ear.  Roaring or ringing in your ear.  Vertigo and loss of balance.  Decreased hearing.  Nausea and vomiting. DIAGNOSIS Your health care provider will perform a physical exam. Tests may be done to confirm a diagnosis of Meniere disease. These tests may include:  A hearing test (audiogram).  An electronystagmogram. This tests your balance nerve (vestibular nerve).  Imaging studies, such as CT or MRI, of your inner ear. TREATMENT There is no cure for Meniere disease, but it can be managed. Management may include:  A diet that may help relieve symptoms of Meniere disease.  Use of medicines to reduce:  Vertigo.  Nausea.  Fluid retention.  Use of an air pressure pulse generator. This is a machine that sends small pressure pulses into your ear canal.  Inner ear surgery (rare). When you experience symptoms, it can be helpful to lie down on a flat surface and focus your eyes on one object that does not move. Try to stay in that position until your symptoms go away.  HOME CARE INSTRUCTIONS   Take medicines only as directed by your health care provider.  Eat the same amount of food at the same time every day, including snacks.  Do not skip meals.  Limit the salt in your diet to 1,000 mg a day.  Avoid caffeine.  Limit alcoholic drinks to one drink a day.  Do not eat foods containing monosodium glutamate  (MSG).  Drink enough fluids to keep your urine clear or pale yellow.  Do not use any tobacco products including cigarettes, chewing tobacco, or electronic cigarettes. If you need help quitting, ask your health care provider.  Find ways to reduce or avoid stress. SEEK MEDICAL CARE IF:   You have symptoms that last longer than 4 hours.  You have new or more severe symptoms. SEEK IMMEDIATE MEDICAL CARE IF:   You have been vomiting for 24 hours.  You are not able to keep fluids down.  You have chest pain or trouble breathing. Document Released: 03/21/2000 Document Revised: 08/08/2013 Document Reviewed: 03/07/2013 [facility] Patient Information 2015 [place], [place]. This information is not intended to replace advice given to you by your health care provider. Make sure you discuss any questions you have with your health care provider.

## 2014-06-08 DIAGNOSIS — H814 Vertigo of central origin: Secondary | ICD-10-CM | POA: Insufficient documentation

## 2014-06-08 DIAGNOSIS — H9313 Tinnitus, bilateral: Secondary | ICD-10-CM | POA: Insufficient documentation

## 2014-06-12 ENCOUNTER — Other Ambulatory Visit: Payer: Self-pay | Admitting: Otolaryngology

## 2014-06-12 DIAGNOSIS — H814 Vertigo of central origin: Secondary | ICD-10-CM

## 2014-06-12 DIAGNOSIS — H9313 Tinnitus, bilateral: Secondary | ICD-10-CM

## 2014-06-26 ENCOUNTER — Telehealth: Payer: Self-pay

## 2014-06-26 ENCOUNTER — Ambulatory Visit
Admission: RE | Admit: 2014-06-26 | Discharge: 2014-06-26 | Disposition: A | Discharge status: Home / Self Care | Payer: Managed Care, Other (non HMO) | Source: Ambulatory Visit | Attending: Otolaryngology | Admitting: Otolaryngology

## 2014-06-26 DIAGNOSIS — H9313 Tinnitus, bilateral: Secondary | ICD-10-CM

## 2014-06-26 DIAGNOSIS — I10 Essential (primary) hypertension: Secondary | ICD-10-CM

## 2014-06-26 DIAGNOSIS — H814 Vertigo of central origin: Secondary | ICD-10-CM

## 2014-06-26 MED ORDER — GADOBENATE DIMEGLUMINE 529 MG/ML IV SOLN
20.0000 mL | Freq: Once | INTRAVENOUS | Status: AC | PRN
  Administered 2014-06-26: 20 mL via INTRAVENOUS

## 2014-06-26 NOTE — Telephone Encounter (Signed)
PATIENT WOULD LIKE CHELLE TO WRITE HER A PRESCRIPTION FOR HER HYDROCHLOROTHIAZIDE 25 MG. SHE IS GOING TO START USING A MAIL-IN PHARMACY, SO SHE WOULD LIKE TO COME AND PICK THE PRESCRIPTION UP. PLEASE CALL HER WHEN IT HAS BEEN WRITTEN. BEST PHONE [phone] (CELL)  [place]

## 2014-06-29 MED ORDER — HYDROCHLOROTHIAZIDE 25 MG PO TABS: 25.0000 mg | ORAL_TABLET | Freq: Every day | ORAL | Status: DC

## 2014-06-29 NOTE — Telephone Encounter (Signed)
Rx sent 

## 2014-06-29 NOTE — Telephone Encounter (Signed)
Pt called wanting to Janet Leblanc to know. Tanina called Christella Scheuermann and found out. Janet Leblanc can e-mail [facility] this script . Please advise at [phone].

## 2014-07-10 ENCOUNTER — Ambulatory Visit (INDEPENDENT_AMBULATORY_CARE_PROVIDER_SITE_OTHER): Payer: Managed Care, Other (non HMO) | Admitting: Neurology

## 2014-07-10 ENCOUNTER — Encounter: Payer: Self-pay | Admitting: Neurology

## 2014-07-10 VITALS — BP 125/85 | HR 88 | Temp 97.0°F | Ht 66.0 in | Wt 242.0 lb

## 2014-07-10 DIAGNOSIS — R42 Dizziness and giddiness: Secondary | ICD-10-CM | POA: Diagnosis not present

## 2014-07-10 NOTE — Progress Notes (Signed)
[facility]    Provider:  Dr Jaynee Eagles Referring Provider: Jodi Marble, MD Primary Care Physician:  No PCP Per Patient  CC:  Vertigo  HPI:  Janet Leblanc is a 64 y.o. female here as a referral from Dr. Erik Obey for vertigo. It started 10 years ago. It is stable. The last bout was 6 weeks ago. It is every 6 months she has a bad bout but sometimes more frequent. Usually in bed, when she gets up she can't stand up, she falls down, room is spinning. When it starts, it lasts for days. She has to lay down, get in a dark space, just be still and not sleep. It has lasted for 4-5 days. +nausea. No vomiting. Meclizine doesn't really helps, just has to suffer through it but it knocks her out. No headaches. No migraines or headaches in between. Feels like she is on a boat. She can't be on a boat even without her episodes, even on a pier she can't look at cracks because she feels dizzy or like she is off balance. Scanning text with her eyes makes it worse and brings on symptoms. Laying still and sleeping makes it better. But she can still be dizzy sitting there. Needs a dark room. No other focal neurologic deficits.   Reviewed notes, labs and imaging from outside physicians, which showed; Personally reviewed images of the brain,  MRI of the brain: IMPRESSION: 1. Negative IAC imaging. 2. Mild for age nonspecific cerebral white matter signal changes, most commonly due to chronic small vessel disease.  CBC/CMP unremarkable  [facility]. Tympanometry normal. dix hallpike negative.   Review of Systems: Patient complains of symptoms per HPI as well as the following symptoms: ringin in ears, spinning sensation, Pertinent negatives per HPI. All others negative.   History   Social History  . Marital Status: Married    Spouse Name: Therron  . Number of Children: 2  . Years of Education: 12   Occupational History  . [employer]     Social History Main Topics  . Smoking  status: Former Research scientist (life sciences)  . Smokeless tobacco: Not on file  . Alcohol Use: No  . Drug Use: No  . Sexual Activity: Not on file   Other Topics Concern  . Not on file   Social History Narrative   Lives at home with husband.   Right handed.    Caffeine use:  Drink Dr. Malachi Bonds (16oz/day), rare coffee, some tea.     Family History  Problem Relation Age of Onset  . Heart disease Father   . Hypertension Father   . Heart disease Brother   . Stroke Brother   . Heart disease Paternal Grandmother   . Cancer Paternal Grandfather   . Migraines Neg Hx     Past Medical History  Diagnosis Date  . Vertigo   . Hypertension     Past Surgical History  Procedure Laterality Date  . Foot surgery    . Gastric bypass      15 years ago at least  . Tubal ligation    . Cholecystectomy      Current Outpatient Prescriptions  Medication Sig Dispense Refill  . clonazePAM (KLONOPIN) 0.5 MG tablet Take 1 tablet (0.5 mg total) by mouth 2 (two) times daily as needed (vertigo). 20 tablet 1  . hydrochlorothiazide (HYDRODIURIL) 25 MG tablet Take 1 tablet (25 mg total) by mouth daily. 90 tablet 0  . Multiple Vitamin (MULTI VITAMIN DAILY PO) Take 1  tablet by mouth.     No current facility-administered medications for this visit.    Allergies as of 07/10/2014 - Review Complete 12/19/2013  Allergen Reaction Noted  . Penicillins Swelling 08/25/2013    Vitals: BP 125/85 mmHg  Pulse 88  Temp(Src) 97 F (36.1 C)  Ht 5\' 6"  (1.676 m)  Wt 242 lb (109.77 kg)  BMI 39.08 kg/m2 Last Weight:  Wt Readings from Last 1 Encounters:  07/10/14 242 lb (109.77 kg)   Last Height:   Ht Readings from Last 1 Encounters:  07/10/14 5\' 6"  (1.676 m)    Physical exam: Exam: Gen: NAD, conversant, well nourised, obese, well groomed                     CV: RRR, no MRG. No Carotid Bruits. No peripheral edema, warm, nontender Eyes: Conjunctivae clear without exudates or hemorrhage  Neuro: Detailed Neurologic  Exam  Speech:    Speech is normal; fluent and spontaneous with normal comprehension.  Cognition:    The patient is oriented to person, place, and time;     recent and remote memory intact;     language fluent;     normal attention, concentration,     fund of knowledge Cranial Nerves:    The pupils are equal, round, and reactive to light. The fundi are normal and spontaneous venous pulsations are present. Visual fields are full to finger confrontation. Extraocular movements are intact. Trigeminal sensation is intact and the muscles of mastication are normal. The face is symmetric. The palate elevates in the midline. Hearing intact. Voice is normal. Shoulder shrug is normal. The tongue has normal motion without fasciculations.   Coordination:    Normal finger to nose and heel to shin. Normal rapid alternating movements.   Gait:    Heel-toe and tandem gait are normal.   Motor Observation:    No asymmetry, no atrophy, and no involuntary movements noted. Tone:    Normal muscle tone.    Posture:    Posture is normal. normal erect    Strength:    Strength is V/V in the upper and lower limbs.      Sensation: intact to LT     Reflex Exam:  DTR's:    Deep tendon reflexes in the upper and lower extremities are normal bilaterally.   Toes:    The toes are downgoing bilaterally.   Clonus:    Clonus is absent.     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  Next time it happens call us, we will have you in for a migraine cocktail to see if symptoms improve Try excedrin migraine at onset Can use meclizine and Klonopin as prescribed for symptomatic relief Recommend ASA 81mg  for stroke prevention and close follow up with pcp for management of vascular risk factors given white matter changes on MRI   Sarina Ill, MD  [facility] [address] [place], [place] [address]  Phone [phone] Fax [phone]

## 2014-08-03 ENCOUNTER — Ambulatory Visit (INDEPENDENT_AMBULATORY_CARE_PROVIDER_SITE_OTHER): Payer: Managed Care, Other (non HMO) | Admitting: Physician Assistant

## 2014-08-03 VITALS — BP 108/72 | HR 74 | Temp 98.0°F | Resp 20 | Ht 65.0 in | Wt 238.1 lb

## 2014-08-03 DIAGNOSIS — N12 Tubulo-interstitial nephritis, not specified as acute or chronic: Secondary | ICD-10-CM | POA: Diagnosis not present

## 2014-08-03 DIAGNOSIS — R34 Anuria and oliguria: Secondary | ICD-10-CM

## 2014-08-03 LAB — POCT UA - MICROSCOPIC ONLY
Bacteria, U Microscopic: NEGATIVE
CASTS, UR, LPF, POC: NEGATIVE
Crystals, Ur, HPF, POC: NEGATIVE
Mucus, UA: NEGATIVE
YEAST UA: NEGATIVE

## 2014-08-03 LAB — POCT URINALYSIS DIPSTICK
BILIRUBIN UA: NEGATIVE
Glucose, UA: 100
KETONES UA: NEGATIVE
Nitrite, UA: POSITIVE
PH UA: 7
PROTEIN UA: NEGATIVE
RBC UA: NEGATIVE
SPEC GRAV UA: 1.01
Urobilinogen, UA: 1

## 2014-08-03 MED ORDER — CIPROFLOXACIN HCL 500 MG PO TABS: 500.0000 mg | ORAL_TABLET | Freq: Two times a day (BID) | ORAL | Status: AC

## 2014-08-03 NOTE — Patient Instructions (Signed)
Pyelonephritis, Adult °Pyelonephritis is a kidney infection. In general, there are 2 main types of pyelonephritis: °· Infections that come on quickly without any warning (acute pyelonephritis). °· Infections that persist for a long period of time (chronic pyelonephritis). °CAUSES  °Two main causes of pyelonephritis are: °· Bacteria traveling from the bladder to the kidney. This is a problem especially in pregnant women. The urine in the bladder can become filled with bacteria from multiple causes, including: °¨ Inflammation of the prostate gland (prostatitis). °¨ Sexual intercourse in females. °¨ Bladder infection (cystitis). °· Bacteria traveling from the bloodstream to the tissue part of the kidney. °Problems that may increase your risk of getting a kidney infection include: °· Diabetes. °· Kidney stones or bladder stones. °· Cancer. °· Catheters placed in the bladder. °· Other abnormalities of the kidney or ureter. °SYMPTOMS  °· Abdominal pain. °· Pain in the side or flank area. °· Fever. °· Chills. °· Upset stomach. °· Blood in the urine (dark urine). °· Frequent urination. °· Strong or persistent urge to urinate. °· Burning or stinging when urinating. °DIAGNOSIS  °Your caregiver may diagnose your kidney infection based on your symptoms. A urine sample may also be taken. °TREATMENT  °In general, treatment depends on how severe the infection is.  °· If the infection is mild and caught early, your caregiver may treat you with oral antibiotics and send you home. °· If the infection is more severe, the bacteria may have gotten into the bloodstream. This will require intravenous (IV) antibiotics and a hospital stay. Symptoms may include: °¨ High fever. °¨ Severe flank pain. °¨ Shaking chills. °· Even after a hospital stay, your caregiver may require you to be on oral antibiotics for a period of time. °· Other treatments may be required depending upon the cause of the infection. °HOME CARE INSTRUCTIONS  °· Take your  antibiotics as directed. Finish them even if you start to feel better. °· Make an appointment to have your urine checked to make sure the infection is gone. °· Drink enough fluids to keep your urine clear or pale yellow. °· Take medicines for the bladder if you have urgency and frequency of urination as directed by your caregiver. °SEEK IMMEDIATE MEDICAL CARE IF:  °· You have a fever or persistent symptoms for more than 2-3 days. °· You have a fever and your symptoms suddenly get worse. °· You are unable to take your antibiotics or fluids. °· You develop shaking chills. °· You experience extreme weakness or fainting. °· There is no improvement after 2 days of treatment. °MAKE SURE YOU: °· Understand these instructions. °· Will watch your condition. °· Will get help right away if you are not doing well or get worse. °Document Released: 03/24/2005 Document Revised: 09/23/2011 Document Reviewed: 08/28/2010 °[employer]® Patient Information ©2015 [employer]. This information is not intended to replace advice given to you by your health care provider. Make sure you discuss any questions you have with your health care provider. ° °

## 2014-08-03 NOTE — Progress Notes (Signed)
[facility] [address] [place] [address] [phone]  Date:  08/03/2014   Name:  Janet Leblanc   DOB:  [birth year]   MRN:  [id number]  PCP:  No PCP Per Patient    Chief Complaint: Urinary Tract Infection   History of Present Illness:  Janet Leblanc is a 64 y.o. very pleasant female patient who presents with the following:  Patient reports 5 days ago, of a weak stream with urination.  She felt slight pain with urination.  She also noticed that there was blood in her urine.  Today, she began to have low back pain.  This is generally her symptoms with a UTI, though she does not get them often, and it has been a few years since last infection.  She has no fever, nausea, vomiting, or fatigue.  She has no vaginal discharge.    Patient Active Problem List   Diagnosis Date Noted  . Vertigo 07/10/2014    Past Medical History  Diagnosis Date  . Vertigo   . Hypertension     Past Surgical History  Procedure Laterality Date  . Foot surgery    . Gastric bypass      15 years ago at least  . Tubal ligation    . Cholecystectomy      History  Substance Use Topics  . Smoking status: Former Research scientist (life sciences)  . Smokeless tobacco: Not on file  . Alcohol Use: No    Family History  Problem Relation Age of Onset  . Heart disease Father   . Hypertension Father   . Heart disease Brother   . Stroke Brother   . Heart disease Paternal Grandmother   . Cancer Paternal Grandfather   . Migraines Neg Hx     Allergies  Allergen Reactions  . Penicillins Swelling    Medication list has been reviewed and updated.  Current Outpatient Prescriptions on File Prior to Visit  Medication Sig Dispense Refill  . clonazePAM (KLONOPIN) 0.5 MG tablet Take 1 tablet (0.5 mg total) by mouth 2 (two) times daily as needed (vertigo). 20 tablet 1  . hydrochlorothiazide (HYDRODIURIL) 25 MG tablet Take 1 tablet (25 mg total) by mouth daily. 90 tablet 0  . Multiple Vitamin (MULTI VITAMIN DAILY  PO) Take 1 tablet by mouth.     No current facility-administered medications on file prior to visit.    Review of Systems: ROS otherwise unremarkable unless listed above.   Physical Examination: Filed Vitals:   08/03/14 1625  BP: 108/72  Pulse: 74  Temp: 98 F (36.7 C)  Resp: 20   Filed Vitals:   08/03/14 1625  Height: 5\' 5"  (1.651 m)  Weight: 238 lb 2 oz (108.013 kg)   Body mass index is 39.63 kg/(m^2). Ideal Body Weight: Weight in (lb) to have BMI = 25: 149.9  Physical Exam  Constitutional: She is oriented to person, place, and time. She appears well-developed and well-nourished.  Eyes: EOM are normal. Pupils are equal, round, and reactive to light.  Cardiovascular: Normal rate, regular rhythm and normal heart sounds.  Exam reveals no friction rub.   No murmur heard. Pulmonary/Chest: Effort normal and breath sounds normal. No respiratory distress. She has no wheezes.  Abdominal: Soft. Normal appearance and bowel sounds are normal. There is no hepatosplenomegaly. There is tenderness in the suprapubic area. There is CVA tenderness (Left side). There is no tenderness at McBurney's point and negative Murphy's sign.  Neurological: She is alert and  oriented to person, place, and time.  Psychiatric: She has a normal mood and affect. Her behavior is normal.    Results for orders placed or performed in visit on 08/03/14  POCT urinalysis dipstick  Result Value Ref Range   Color, UA orange    Clarity, UA clear    Glucose, UA 100    Bilirubin, UA neg    Ketones, UA neg    Spec Grav, UA 1.010    Blood, UA neg    pH, UA 7.0    Protein, UA neg    Urobilinogen, UA 1.0    Nitrite, UA positive    Leukocytes, UA Trace   POCT UA - Microscopic Only  Result Value Ref Range   WBC, Ur, HPF, POC 0-5    RBC, urine, microscopic 0-1    Bacteria, U Microscopic neg    Mucus, UA neg    Epithelial cells, urine per micros 0-1    Crystals, Ur, HPF, POC neg    Casts, Ur, LPF, POC neg     Yeast, UA neg      Assessment and Plan: 64 year old female is here today for chief complaint of dysuria and back pain.  UA with positive nitrites.  Given CVA tenderness, will treat for kidney infection.  Pyelonephritis - Plan: COMPLETE METABOLIC PANEL WITH GFR, Urine culture, ciprofloxacin (CIPRO) 500 MG tablet  Urine output low - Plan: POCT urinalysis dipstick, POCT UA - Microscopic Only, COMPLETE METABOLIC PANEL WITH GFR, Urine culture  Ivar Drape, PA-C [facility] 4/29/20169:16 AM

## 2014-08-04 LAB — COMPLETE METABOLIC PANEL WITH GFR
ALT: 16 U/L (ref 0–35)
AST: 20 U/L (ref 0–37)
Albumin: 4 g/dL (ref 3.5–5.2)
Alkaline Phosphatase: 94 U/L (ref 39–117)
BILIRUBIN TOTAL: 0.5 mg/dL (ref 0.2–1.2)
BUN: 23 mg/dL (ref 6–23)
CALCIUM: 9 mg/dL (ref 8.4–10.5)
CHLORIDE: 99 meq/L (ref 96–112)
CO2: 25 meq/L (ref 19–32)
Creat: 1.16 mg/dL — ABNORMAL HIGH (ref 0.50–1.10)
GFR, EST AFRICAN AMERICAN: 58 mL/min — AB
GFR, EST NON AFRICAN AMERICAN: 51 mL/min — AB
Glucose, Bld: 87 mg/dL (ref 70–99)
Potassium: 4.1 mEq/L (ref 3.5–5.3)
Sodium: 139 mEq/L (ref 135–145)
TOTAL PROTEIN: 6.4 g/dL (ref 6.0–8.3)

## 2014-08-04 LAB — URINE CULTURE
COLONY COUNT: NO GROWTH
Organism ID, Bacteria: NO GROWTH

## 2014-08-24 ENCOUNTER — Telehealth: Payer: Self-pay | Admitting: Physician Assistant

## 2014-08-24 NOTE — Telephone Encounter (Signed)
LM on voicemail that I would like to talk to her about her lab results, and to also see how she is feeling.  I will call her in the morning.

## 2014-08-25 ENCOUNTER — Other Ambulatory Visit: Payer: Self-pay | Admitting: Physician Assistant

## 2014-08-25 DIAGNOSIS — N289 Disorder of kidney and ureter, unspecified: Secondary | ICD-10-CM

## 2014-08-25 DIAGNOSIS — R319 Hematuria, unspecified: Secondary | ICD-10-CM

## 2014-08-25 NOTE — Progress Notes (Signed)
Spoke with patient who is doing well.  She states that her urinary symptoms are relieved.  She has no dysuria, hematuria, or frequency.  She denies fever.  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  She states that she has no knowledge of kidney problems.  She was seen for years at the [facility] and reports a hx of urinary infections.   Plan: at this time I am recommending that she have [place] of kidneys and referral to urology.  In her visit with me, she stated that she had hematuria during early symptoms though this was not seen with UA.  Positive nitrites but negative culture.  Possible bladder diverticuli, etc.

## 2014-10-17 ENCOUNTER — Other Ambulatory Visit: Payer: Self-pay | Admitting: Family Medicine

## 2014-10-20 ENCOUNTER — Other Ambulatory Visit: Payer: Self-pay

## 2014-10-20 MED ORDER — HYDROCHLOROTHIAZIDE 25 MG PO TABS: ORAL_TABLET | ORAL | Status: DC

## 2014-11-23 ENCOUNTER — Encounter: Payer: Self-pay | Admitting: *Deleted

## 2014-11-23 DIAGNOSIS — H9313 Tinnitus, bilateral: Secondary | ICD-10-CM

## 2014-11-23 DIAGNOSIS — H814 Vertigo of central origin: Secondary | ICD-10-CM

## 2014-11-23 NOTE — Assessment & Plan Note (Signed)
[facility] Jodi Marble, MD 11/08/79  Orders: MRI Brain + Temporal Bones w/wo contrast  Discussed: Your hearing is quite excellent today.  The pattern of vertigo you describe does not sound like it is coming from the inner ears.  I would like to do an MRI scan of your head and will call you with those results.  You are okay to use the motion sickness pills to help with your symptoms.  We may need to send you to a neurologist.

## 2014-12-01 ENCOUNTER — Ambulatory Visit: Payer: Managed Care, Other (non HMO) | Admitting: Podiatry

## 2014-12-04 ENCOUNTER — Ambulatory Visit (INDEPENDENT_AMBULATORY_CARE_PROVIDER_SITE_OTHER): Payer: Managed Care, Other (non HMO)

## 2014-12-04 ENCOUNTER — Encounter: Payer: Self-pay | Admitting: Podiatry

## 2014-12-04 ENCOUNTER — Ambulatory Visit (INDEPENDENT_AMBULATORY_CARE_PROVIDER_SITE_OTHER): Payer: Managed Care, Other (non HMO) | Admitting: Podiatry

## 2014-12-04 DIAGNOSIS — M79672 Pain in left foot: Secondary | ICD-10-CM

## 2014-12-04 DIAGNOSIS — M722 Plantar fascial fibromatosis: Secondary | ICD-10-CM | POA: Diagnosis not present

## 2014-12-04 DIAGNOSIS — M779 Enthesopathy, unspecified: Secondary | ICD-10-CM

## 2014-12-04 MED ORDER — TRIAMCINOLONE ACETONIDE 10 MG/ML IJ SUSP
10.0000 mg | Freq: Once | INTRAMUSCULAR | Status: AC
  Administered 2014-12-04: 10 mg

## 2014-12-05 NOTE — Progress Notes (Signed)
Subjective:     Patient ID: Janet Leblanc, female   DOB: 02/20/1951, 64 y.o.   MRN: [id number]  HPI patient states this joint on my right foot has started to hurt again and my left hallux nail is darkened discolored and I'm worried that I may lose. Also my left heel has been bother me a lot and making walking difficult   Review of Systems     Objective:   Physical Exam Neurovascular status intact muscle strength adequate with inflammation fluid around the third metatarsal phalangeal joint right and exquisite discomfort in the plantar aspect of the left heel at the insertion of the tendon into the calcaneus with moderate depression of the arch noted bilateral.    Assessment:     Inflammatory capsulitis third right with mechanical dysfunction of the arch with inflammation consistent with plantar fasciitis left    Plan:     Reviewed both conditions and did proximal nerve block right aspirated the joint getting out a small amount of clear fluid and injected with a quarter cc dexamethasone Kenalog and applied thick pad and reviewed x-rays of the left and injected the left plantar fascia 3 mg Kenalog 5 mill grams Xylocaine and applied fascial brace. Discussed long-term orthotics and reappoint in the next 2 weeks

## 2014-12-18 ENCOUNTER — Ambulatory Visit: Payer: Managed Care, Other (non HMO) | Admitting: Podiatry

## 2014-12-27 ENCOUNTER — Ambulatory Visit: Payer: Managed Care, Other (non HMO) | Admitting: Podiatry

## 2014-12-28 ENCOUNTER — Encounter: Payer: Self-pay | Admitting: Podiatry

## 2014-12-28 ENCOUNTER — Ambulatory Visit (INDEPENDENT_AMBULATORY_CARE_PROVIDER_SITE_OTHER): Payer: Managed Care, Other (non HMO) | Admitting: Podiatry

## 2014-12-28 VITALS — BP 120/77 | HR 72 | Resp 16

## 2014-12-28 DIAGNOSIS — M79672 Pain in left foot: Secondary | ICD-10-CM

## 2014-12-28 DIAGNOSIS — M722 Plantar fascial fibromatosis: Secondary | ICD-10-CM | POA: Diagnosis not present

## 2014-12-28 DIAGNOSIS — M779 Enthesopathy, unspecified: Secondary | ICD-10-CM

## 2014-12-28 MED ORDER — TRIAMCINOLONE ACETONIDE 10 MG/ML IJ SUSP
10.0000 mg | Freq: Once | INTRAMUSCULAR | Status: AC
  Administered 2014-12-28: 10 mg

## 2014-12-29 NOTE — Progress Notes (Signed)
Subjective:     Patient ID: Janet Leblanc, female   DOB: 19-Jun-1950, 64 y.o.   MRN: [id number]  HPI patient presents stating I'm still having discomfort in my left heel and my right foot still bothers me some but still gives me less discomfort than previous   Review of Systems     Objective:   Physical Exam Neurovascular status intact muscle strength adequate with inflammation still plantar left heel and mild discomfort in the forefoot right around the metatarsal phalangeal joint    Assessment:     Inflammatory capsulitis still present right fasciitis left still present    Plan:     Careful fascially injection administered and went ahead and scanned for custom orthotics to reduce stress against the plantar fascia and against the capsule

## 2015-01-15 ENCOUNTER — Ambulatory Visit (INDEPENDENT_AMBULATORY_CARE_PROVIDER_SITE_OTHER): Payer: Managed Care, Other (non HMO) | Admitting: Urgent Care

## 2015-01-15 VITALS — BP 124/70 | HR 72 | Temp 97.6°F | Resp 18 | Wt 236.0 lb

## 2015-01-15 DIAGNOSIS — I10 Essential (primary) hypertension: Secondary | ICD-10-CM

## 2015-01-15 DIAGNOSIS — J029 Acute pharyngitis, unspecified: Secondary | ICD-10-CM | POA: Diagnosis not present

## 2015-01-15 LAB — COMPLETE METABOLIC PANEL WITH GFR
ALT: 19 U/L (ref 6–29)
AST: 16 U/L (ref 10–35)
Albumin: 3.7 g/dL (ref 3.6–5.1)
Alkaline Phosphatase: 78 U/L (ref 33–130)
BUN: 16 mg/dL (ref 7–25)
CHLORIDE: 103 mmol/L (ref 98–110)
CO2: 29 mmol/L (ref 20–31)
Calcium: 8.9 mg/dL (ref 8.6–10.4)
Creat: 1.08 mg/dL — ABNORMAL HIGH (ref 0.50–0.99)
GFR, Est African American: 63 mL/min (ref >=60)
GFR, Est Non African American: 55 mL/min — ABNORMAL LOW (ref >=60)
GLUCOSE: 83 mg/dL (ref 65–99)
POTASSIUM: 4.4 mmol/L (ref 3.5–5.3)
SODIUM: 140 mmol/L (ref 135–146)
Total Bilirubin: 0.4 mg/dL (ref 0.2–1.2)
Total Protein: 6.2 g/dL (ref 6.1–8.1)

## 2015-01-15 LAB — POCT RAPID STREP A (OFFICE): Rapid Strep A Screen: NEGATIVE

## 2015-01-15 MED ORDER — HYDROCODONE-HOMATROPINE 5-1.5 MG/5ML PO SYRP: 5.0000 mL | ORAL_SOLUTION | Freq: Every evening | ORAL | Status: DC | PRN

## 2015-01-15 MED ORDER — HYDROCHLOROTHIAZIDE 25 MG PO TABS
25.0000 mg | ORAL_TABLET | Freq: Every day | ORAL | Status: DC
Start: 2015-01-15 — End: 2016-03-24

## 2015-01-15 NOTE — Progress Notes (Signed)
    MRN: [id number] DOB: 1950-12-22  Subjective:   Janet Leblanc is a 64 y.o. female presenting for chief complaint of Medication Refill and Sore Throat  Sore throat - reports 1 day history of worsening sore throat, mild cough, runny nose, malaise. Tried NyQuil, Mucinex and Advil cold/sinus with some relief. Of note, patient works at [employer], states that she has had plenty of sick contacts. She has had her flu shot this year. Denies fever, itchy or red eyes, sinus pain, sinus congestion, ear pain, ear drainage, tooth pain. Admits history of seasonal allergies, takes benadryl occasionally for this.  HTN - managed with HCT 25mg . Does well with this. She is being seen by [facility] for undifferentiated hematuria. Denies lightheadedness, dizziness, chronic headache, double vision, chest pain, shortness of breath, heart racing, palpitations, nausea, vomiting, abdominal pain, lower leg swelling.  Denies any other aggravating or relieving factors, no other questions or concerns.  [name] has a current medication list which includes the following prescription(s): aspirin, hydrochlorothiazide, multiple vitamin, and clonazepam. Also is allergic to penicillins.  [name]  has a past medical history of Vertigo and Hypertension. Also  has past surgical history that includes Foot surgery; Gastric bypass; Tubal ligation; and Cholecystectomy.  Objective:   Vitals: BP 124/70 mmHg  Pulse 72  Temp(Src) 97.6 F (36.4 C) (Oral)  Resp 18  Wt 236 lb (107.049 kg)  SpO2 98%  BP Readings from Last 3 Encounters:  01/15/15 124/70  12/28/14 120/77  08/03/14 108/72   Physical Exam  Constitutional: She is oriented to person, place, and time. She appears well-developed and well-nourished.  HENT:  TM's intact bilaterally, no effusions or erythema. Nares patent, nasal turbinates pink and moist, nasal passages patent. No sinus tenderness. Oropharynx clear, mucous membranes moist, dentition in good repair.    Eyes: Conjunctivae are normal. No scleral icterus.  Neck: Normal range of motion. Neck supple. No thyromegaly present.  Cardiovascular: Normal rate, regular rhythm and intact distal pulses.  Exam reveals no gallop and no friction rub.   No murmur heard. Pulmonary/Chest: No respiratory distress. She has no wheezes. She has no rales.  Abdominal: Soft. Bowel sounds are normal. She exhibits no distension and no mass. There is no tenderness.  Musculoskeletal: She exhibits no edema or tenderness.  Multiple dilated and tortuous veins in lower extremitities.  Lymphadenopathy:    She has no cervical adenopathy.  Neurological: She is alert and oriented to person, place, and time.  Skin: Skin is warm and dry. No rash noted. No erythema. No pallor.   Results for orders placed or performed in visit on 01/15/15 (from the past 24 hour(s))  POCT rapid strep A     Status: None   Collection Time: 01/15/15 12:33 PM  Result Value Ref Range   Rapid Strep A Screen Negative Negative   Assessment and Plan :   1. Essential hypertension - Well-controlled, continue current regimen. Follow up in 6 months.  2. Sore throat - Strep culture pending, likely undergoing viral syndrome, advised supportive care. Recheck in 1 week and if no improvement, consider antibiotic course.  Jaynee Eagles, PA-C [facility] [phone] 01/15/2015 12:07 PM

## 2015-01-15 NOTE — Patient Instructions (Addendum)
Hypertension Hypertension, commonly called high blood pressure, is when the force of blood pumping through your arteries is too strong. Your arteries are the blood vessels that carry blood from your heart throughout your body. A blood pressure reading consists of a higher number over a lower number, such as 110/72. The higher number (systolic) is the pressure inside your arteries when your heart pumps. The lower number (diastolic) is the pressure inside your arteries when your heart relaxes. Ideally you want your blood pressure below 120/80. Hypertension forces your heart to work harder to pump blood. Your arteries may become narrow or stiff. Having untreated or uncontrolled hypertension can cause heart attack, stroke, kidney disease, and other problems. RISK FACTORS Some risk factors for high blood pressure are controllable. Others are not.  Risk factors you cannot control include:   Race. You may be at higher risk if you are African American.  Age. Risk increases with age.  Gender. Men are at higher risk than women before age 45 years. After age 65, women are at higher risk than men. Risk factors you can control include:  Not getting enough exercise or physical activity.  Being overweight.  Getting too much fat, sugar, calories, or salt in your diet.  Drinking too much alcohol. SIGNS AND SYMPTOMS Hypertension does not usually cause signs or symptoms. Extremely high blood pressure (hypertensive crisis) may cause headache, anxiety, shortness of breath, and nosebleed. DIAGNOSIS To check if you have hypertension, your health care provider will measure your blood pressure while you are seated, with your arm held at the level of your heart. It should be measured at least twice using the same arm. Certain conditions can cause a difference in blood pressure between your right and left arms. A blood pressure reading that is higher than normal on one occasion does not mean that you need treatment. If  it is not clear whether you have high blood pressure, you may be asked to return on a different day to have your blood pressure checked again. Or, you may be asked to monitor your blood pressure at home for 1 or more weeks. TREATMENT Treating high blood pressure includes making lifestyle changes and possibly taking medicine. Living a healthy lifestyle can help lower high blood pressure. You may need to change some of your habits. Lifestyle changes may include:  Following the DASH diet. This diet is high in fruits, vegetables, and whole grains. It is low in salt, red meat, and added sugars.  Keep your sodium intake below 2,300 mg per day.  Getting at least 30-45 minutes of aerobic exercise at least 4 times per week.  Losing weight if necessary.  Not smoking.  Limiting alcoholic beverages.  Learning ways to reduce stress. Your health care provider may prescribe medicine if lifestyle changes are not enough to get your blood pressure under control, and if one of the following is true:  You are 18-59 years of age and your systolic blood pressure is above 140.  You are 60 years of age or older, and your systolic blood pressure is above 150.  Your diastolic blood pressure is above 90.  You have diabetes, and your systolic blood pressure is over 140 or your diastolic blood pressure is over 90.  You have kidney disease and your blood pressure is above 140/90.  You have heart disease and your blood pressure is above 140/90. Your personal target blood pressure may vary depending on your medical conditions, your age, and other factors. HOME CARE INSTRUCTIONS    Have your blood pressure rechecked as directed by your health care provider.   Take medicines only as directed by your health care provider. Follow the directions carefully. Blood pressure medicines must be taken as prescribed. The medicine does not work as well when you skip doses. Skipping doses also puts you at risk for  problems.  Do not smoke.   Monitor your blood pressure at home as directed by your health care provider. SEEK MEDICAL CARE IF:   You think you are having a reaction to medicines taken.  You have recurrent headaches or feel dizzy.  You have swelling in your ankles.  You have trouble with your vision. SEEK IMMEDIATE MEDICAL CARE IF:  You develop a severe headache or confusion.  You have unusual weakness, numbness, or feel faint.  You have severe chest or abdominal pain.  You vomit repeatedly.  You have trouble breathing. MAKE SURE YOU:   Understand these instructions.  Will watch your condition.  Will get help right away if you are not doing well or get worse.   This information is not intended to replace advice given to you by your health care provider. Make sure you discuss any questions you have with your health care provider.   Document Released: 03/24/2005 Document Revised: 08/08/2014 Document Reviewed: 01/14/2013 Elsevier Interactive Patient Education 2016 [place] can try cold-eeze for colds. -   Pharyngitis Pharyngitis is redness, pain, and swelling (inflammation) of your pharynx.  CAUSES  Pharyngitis is usually caused by infection. Most of the time, these infections are from viruses (viral) and are part of a cold. However, sometimes pharyngitis is caused by bacteria (bacterial). Pharyngitis can also be caused by allergies. Viral pharyngitis may be spread from person to person by coughing, sneezing, and personal items or utensils (cups, forks, spoons, toothbrushes). Bacterial pharyngitis may be spread from person to person by more intimate contact, such as kissing.  SIGNS AND SYMPTOMS  Symptoms of pharyngitis include:   Sore throat.   Tiredness (fatigue).   Low-grade fever.   Headache.  Joint pain and muscle aches.  Skin rashes.  Swollen lymph nodes.  Plaque-like film on throat or tonsils (often seen with bacterial  pharyngitis). DIAGNOSIS  Your health care provider will ask you questions about your illness and your symptoms. Your medical history, along with a physical exam, is often all that is needed to diagnose pharyngitis. Sometimes, a rapid strep test is done. Other lab tests may also be done, depending on the suspected cause.  TREATMENT  Viral pharyngitis will usually get better in 3-4 days without the use of medicine. Bacterial pharyngitis is treated with medicines that kill germs (antibiotics).  HOME CARE INSTRUCTIONS   Drink enough water and fluids to keep your urine clear or pale yellow.   Only take over-the-counter or prescription medicines as directed by your health care provider:   If you are prescribed antibiotics, make sure you finish them even if you start to feel better.   Do not take aspirin.   Get lots of rest.   Gargle with 8 oz of salt water ( tsp of salt per 1 qt of water) as often as every 1-2 hours to soothe your throat.   Throat lozenges (if you are not at risk for choking) or sprays may be used to soothe your throat. SEEK MEDICAL CARE IF:   You have large, tender lumps in your neck.  You have a rash.  You cough up green, yellow-brown, or bloody  spit. SEEK IMMEDIATE MEDICAL CARE IF:   Your neck becomes stiff.  You drool or are unable to swallow liquids.  You vomit or are unable to keep medicines or liquids down.  You have severe pain that does not go away with the use of recommended medicines.  You have trouble breathing (not caused by a stuffy nose). MAKE SURE YOU:   Understand these instructions.  Will watch your condition.  Will get help right away if you are not doing well or get worse.   This information is not intended to replace advice given to you by your health care provider. Make sure you discuss any questions you have with your health care provider.   Document Released: 03/24/2005 Document Revised: 01/12/2013 Document Reviewed:  11/29/2012 Elsevier Interactive Patient Education [employer].

## 2015-01-17 LAB — CULTURE, GROUP A STREP: ORGANISM ID, BACTERIA: NORMAL

## 2015-01-19 ENCOUNTER — Ambulatory Visit: Payer: Managed Care, Other (non HMO) | Admitting: *Deleted

## 2015-01-19 DIAGNOSIS — M722 Plantar fascial fibromatosis: Secondary | ICD-10-CM

## 2015-01-19 NOTE — Progress Notes (Signed)
Patient ID: Janet Leblanc, female   DOB: 1950-07-21, 64 y.o.   MRN: [id number] Patient presents for orthotic pick up.  Verbal and written break in and wear instructions given.  Patient will follow up in 4 weeks if symptoms worsen or fail to improve.

## 2015-01-19 NOTE — Patient Instructions (Signed)

## 2015-02-16 ENCOUNTER — Ambulatory Visit: Payer: Managed Care, Other (non HMO) | Admitting: Podiatry

## 2015-05-07 ENCOUNTER — Telehealth: Payer: Self-pay | Admitting: Neurology

## 2015-05-07 NOTE — Telephone Encounter (Signed)
Pt coming this morning for migraine cocktail. Someone is going to be bringing her as instructed.

## 2015-05-07 NOTE — Telephone Encounter (Signed)
Pt called said she started having an episode of vertigo Saturday 05/05/15. She is nauseated and if she gets up almost falls. She said she had a couple of small episodes in 2016 but it would go away within a day. She said this episode has not improved at all. She has taken Excedrin migraine yesterday 05/06/15, last night and the night before she took clonazePAM (KLONOPIN) 0.5 MG tablet for sleep. It did help with sleep but when she awoke the symptoms started again. She has been lying in the dark all weekend trying not to move her head. Please call.

## 2015-05-07 NOTE — Telephone Encounter (Signed)
Tried calling pt again. Could not reach.

## 2015-05-07 NOTE — Telephone Encounter (Signed)
Called Janet Leblanc back. Janet Leblanc called requesting migraine cocktail per Dr Jaynee Eagles OV note as instructed. Ok per Dr Jaynee Eagles to have migraine cocktail. LVM instructing she can come now for cocktail. She must have a driver because some of the medication will cause sedation/sleepiness. Gave [facility] phone number for her to call back and let me know.

## 2016-03-24 ENCOUNTER — Ambulatory Visit (INDEPENDENT_AMBULATORY_CARE_PROVIDER_SITE_OTHER): Payer: Managed Care, Other (non HMO)

## 2016-03-24 ENCOUNTER — Ambulatory Visit (INDEPENDENT_AMBULATORY_CARE_PROVIDER_SITE_OTHER): Payer: Managed Care, Other (non HMO) | Admitting: Physician Assistant

## 2016-03-24 ENCOUNTER — Ambulatory Visit (HOSPITAL_COMMUNITY)
Admission: RE | Admit: 2016-03-24 | Discharge: 2016-03-24 | Disposition: A | Discharge status: Home / Self Care | Payer: Managed Care, Other (non HMO) | Source: Ambulatory Visit | Attending: Physician Assistant | Admitting: Physician Assistant

## 2016-03-24 VITALS — BP 122/80 | HR 73 | Temp 97.5°F | Resp 18 | Ht 65.0 in | Wt 235.0 lb

## 2016-03-24 DIAGNOSIS — M25572 Pain in left ankle and joints of left foot: Secondary | ICD-10-CM | POA: Diagnosis present

## 2016-03-24 DIAGNOSIS — E1159 Type 2 diabetes mellitus with other circulatory complications: Secondary | ICD-10-CM | POA: Insufficient documentation

## 2016-03-24 DIAGNOSIS — I10 Essential (primary) hypertension: Secondary | ICD-10-CM | POA: Diagnosis not present

## 2016-03-24 MED ORDER — HYDROCHLOROTHIAZIDE 25 MG PO TABS: 25.0000 mg | ORAL_TABLET | Freq: Every day | ORAL | 3 refills | Status: DC

## 2016-03-24 MED ORDER — MELOXICAM 15 MG PO TABS: 15.0000 mg | ORAL_TABLET | Freq: Every day | ORAL | 1 refills | Status: DC

## 2016-03-24 NOTE — Patient Instructions (Signed)
     IF you received an x-ray today, you will receive an invoice from [facility]. Please contact  Radiology at [phone] with questions or concerns regarding your invoice.   IF you received labwork today, you will receive an invoice from [facility]. Please contact [facility] at [phone] with questions or concerns regarding your invoice.   Our billing staff will not be able to assist you with questions regarding bills from these companies.  You will be contacted with the lab results as soon as they are available. The fastest way to get your results is to activate your My Chart account. Instructions are located on the last page of this paperwork. If you have not heard from us regarding the results in 2 weeks, please contact this office.     

## 2016-03-24 NOTE — Progress Notes (Signed)
Patient ID: Janet Leblanc, female    DOB: 1951/03/08, 65 y.o.   MRN: [id number]  PCP: No PCP Per Patient  Chief Complaint  Patient presents with  . Ankle Pain    swelling left   . Medication Refill    hydrochlorothiazide    Subjective:   Presents for evaluation of pin and swelling of the LEFT ankle, and medication refill.  Feels a "knot" and has tenderness and redness, first noticed 3-4 days ago. Fell 2 weeks ago, landed on the RIGHT knee. Didn't think she'd injured this foot/ankle.  On her feet 10-12 hours/day at work. Usually doesn't have swelling in the feet.  No fever/chills. No CP, SOB, HA, dizziness.  Tolerates HCTZ well without adverse effects.   Review of Systems  Constitutional: Negative.   Respiratory: Negative for chest tightness and shortness of breath.   Cardiovascular: Positive for leg swelling. Negative for chest pain and palpitations.  Musculoskeletal: Negative for gait problem and joint swelling (swelling is above the ankle).  Neurological: Negative for dizziness, weakness, light-headedness and headaches.       Patient Active Problem List   Diagnosis Date Noted  . 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 Tinnitus of both ears 06/08/2014     Prior to Admission medications   Medication Sig Start Date End Date Taking? Authorizing Provider  aspirin 81 MG tablet Take 81 mg by mouth daily.   Yes Historical Provider, MD  clonazePAM (KLONOPIN) 0.5 MG tablet Take 1 tablet (0.5 mg total) by mouth 2 (two) times daily as needed (vertigo). 06/03/14  Yes Shawnee Knapp, MD  hydrochlorothiazide (HYDRODIURIL) 25 MG tablet Take 1 tablet (25 mg total) by mouth daily. 01/15/15  Yes Jaynee Eagles, PA-C  Multiple Vitamin (MULTI VITAMIN DAILY PO) Take 1 tablet by mouth.   Yes Historical Provider, MD     Allergies  Allergen Reactions  . Penicillins Swelling       Objective:  Physical Exam  Constitutional: She is oriented to person, place, and  time. She appears well-developed and well-nourished. She is active and cooperative. No distress.  BP 122/80 (BP Location: Right Arm, Patient Position: Sitting, Cuff Size: Large)   Pulse 73   Temp 97.5 F (36.4 C) (Oral)   Resp 18   Ht 5\' 5"  (1.651 m)   Wt 235 lb (106.6 kg)   SpO2 99%   BMI 39.11 kg/m    Eyes: Conjunctivae are normal.  Pulmonary/Chest: Effort normal.  Musculoskeletal:       Right knee: She exhibits ecchymosis (anteriorly, consistent with her described fall, landing on the flexed knee).       Left ankle: She exhibits swelling (1+ edema of the ankle). She exhibits normal range of motion, no ecchymosis, no deformity, no laceration and normal pulse. Achilles tendon normal.       Feet:  Neurological: She is alert and oriented to person, place, and time.  Psychiatric: She has a normal mood and affect. Her speech is normal and behavior is normal.       Dg Ankle Complete Left  Result Date: 03/24/2016 CLINICAL DATA:  Swelling medial ankle above malleolus.  No trauma EXAM: LEFT ANKLE COMPLETE - 3+ VIEW COMPARISON:  None. FINDINGS: Ankle mortise intact. The talar dome is normal. No malleolar fracture. The calcaneus is normal except for degenerate spurring. Vascular calcifications noted IMPRESSION: No acute osseous abnormality. Electronically Signed   By: Suzy Bouchard M.D.   On: 03/24/2016 11:34  Assessment & Plan:   1. Acute left ankle pain LE doppler to evaluate for thrombosis. If negative, plan elevation, heat application and NSAID. - DG Ankle Complete Left; Future - VAS [place] LOWER EXTREMITY VENOUS (DVT); Future  2. Benign essential HTN Controlled.  - hydrochlorothiazide (HYDRODIURIL) 25 MG tablet; Take 1 tablet (25 mg total) by mouth daily.  Dispense: 90 tablet; Refill: 3   Fara Chute, PA-C Physician Assistant-Certified [facility]

## 2016-03-24 NOTE — Progress Notes (Addendum)
VASCULAR LAB PRELIMINARY  PRELIMINARY  PRELIMINARY  PRELIMINARY  Left lower extremity venous duplex completed.    Preliminary report:  Left:  No evidence of DVT, superficial thrombosis, or Baker's cyst.  Terrian Ridlon, RVS 03/24/2016, 4:47 PM

## 2016-03-25 ENCOUNTER — Other Ambulatory Visit: Payer: Self-pay | Admitting: Family Medicine

## 2016-03-25 MED ORDER — MELOXICAM 15 MG PO TABS: 15.0000 mg | ORAL_TABLET | Freq: Every day | ORAL | 1 refills | Status: DC

## 2016-03-25 NOTE — Telephone Encounter (Signed)
Please send her Mobic to the [facility] on [address] per patient

## 2016-03-25 NOTE — Telephone Encounter (Signed)
Seen 03/24/16

## 2016-04-09 ENCOUNTER — Ambulatory Visit (INDEPENDENT_AMBULATORY_CARE_PROVIDER_SITE_OTHER): Payer: Managed Care, Other (non HMO)

## 2016-04-09 ENCOUNTER — Ambulatory Visit: Payer: Managed Care, Other (non HMO)

## 2016-04-09 ENCOUNTER — Ambulatory Visit (INDEPENDENT_AMBULATORY_CARE_PROVIDER_SITE_OTHER): Payer: Managed Care, Other (non HMO) | Admitting: Podiatry

## 2016-04-09 ENCOUNTER — Encounter: Payer: Self-pay | Admitting: Podiatry

## 2016-04-09 DIAGNOSIS — Q828 Other specified congenital malformations of skin: Secondary | ICD-10-CM

## 2016-04-09 DIAGNOSIS — M2010 Hallux valgus (acquired), unspecified foot: Secondary | ICD-10-CM | POA: Diagnosis not present

## 2016-04-09 DIAGNOSIS — M779 Enthesopathy, unspecified: Secondary | ICD-10-CM | POA: Diagnosis not present

## 2016-04-09 MED ORDER — TRIAMCINOLONE ACETONIDE 10 MG/ML IJ SUSP
10.0000 mg | Freq: Once | INTRAMUSCULAR | Status: AC
  Administered 2016-04-09: 10 mg

## 2016-04-10 NOTE — Progress Notes (Signed)
Subjective:     Patient ID: Darl Householder, female   DOB: [birth year], 66 y.o.   MRN: [id number]  HPI patient presents stating she's developed a lot of pain underneath her left foot around the fifth metatarsal and it's making it hard to walk   Review of Systems     Objective:   Physical Exam Neurovascular status intact with inflammatory capsulitis fifth MPJ left with keratotic-type lesion with lucent core when trimmed with no indications of pinpoint bleeding.    Assessment:     Probability for plantarflexed metatarsal with hair ptotic lesion formation and fluid buildup around the fifth MPJ    Plan:     H&P conditions reviewed and careful injection administered fifth MPJ 3 mg [place] some Kenalog 5 mill grams Xylocaine to shrink the inflamed tissue. Debris did lesion fully gave instructions on supportive shoes and reappoint to recheck

## 2016-11-03 ENCOUNTER — Encounter: Payer: Self-pay | Admitting: Podiatry

## 2016-11-03 ENCOUNTER — Ambulatory Visit (INDEPENDENT_AMBULATORY_CARE_PROVIDER_SITE_OTHER): Payer: Medicare Other | Admitting: Podiatry

## 2016-11-03 DIAGNOSIS — M7751 Other enthesopathy of right foot: Secondary | ICD-10-CM | POA: Diagnosis not present

## 2016-11-03 DIAGNOSIS — M7752 Other enthesopathy of left foot: Secondary | ICD-10-CM

## 2016-11-03 DIAGNOSIS — Q828 Other specified congenital malformations of skin: Secondary | ICD-10-CM | POA: Diagnosis not present

## 2016-11-03 DIAGNOSIS — M779 Enthesopathy, unspecified: Secondary | ICD-10-CM

## 2016-11-03 MED ORDER — TRIAMCINOLONE ACETONIDE 10 MG/ML IJ SUSP
10.0000 mg | Freq: Once | INTRAMUSCULAR | Status: AC
  Administered 2016-11-03: 10 mg

## 2016-11-04 NOTE — Progress Notes (Signed)
Subjective:    Patient ID: Janet Leblanc, female   DOB: 66 y.o.   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 Patient will be seen back to recheck again as needed and may ultimately require metatarsal head resection

## 2016-11-21 ENCOUNTER — Telehealth: Payer: Self-pay | Admitting: Physician Assistant

## 2016-11-21 NOTE — Telephone Encounter (Signed)
Pt came in concerning hydrocholorothiazide. She use to use [facility] but is needing to switch to [facility]. She said they have tried contacting our office via fax to have this switched but they have not received anything. She said that they are stating she has 2 refills left. The pt stated she has enough medicine to last for about 2 weeks. [facility] can be contacted at [phone]. We can fax this prescription to [phone]. Thanks.

## 2016-11-24 ENCOUNTER — Other Ambulatory Visit: Payer: Self-pay

## 2016-11-24 DIAGNOSIS — I10 Essential (primary) hypertension: Secondary | ICD-10-CM

## 2016-11-24 MED ORDER — HYDROCHLOROTHIAZIDE 25 MG PO TABS: 25.0000 mg | ORAL_TABLET | Freq: Every day | ORAL | 0 refills | Status: DC

## 2016-11-24 NOTE — Telephone Encounter (Signed)
We changed the mail in pharmacy per pt request. Pt was only given a 30 day supply and needs a OV. She hasn't been seen in 6 months.

## 2016-12-22 ENCOUNTER — Encounter: Payer: Self-pay | Admitting: Family Medicine

## 2016-12-22 ENCOUNTER — Ambulatory Visit (INDEPENDENT_AMBULATORY_CARE_PROVIDER_SITE_OTHER): Payer: Medicare Other | Admitting: Family Medicine

## 2016-12-22 VITALS — BP 123/83 | HR 63 | Temp 97.6°F | Resp 17 | Ht 65.0 in | Wt 245.6 lb

## 2016-12-22 DIAGNOSIS — R300 Dysuria: Secondary | ICD-10-CM

## 2016-12-22 MED ORDER — SULFAMETHOXAZOLE-TRIMETHOPRIM 800-160 MG PO TABS: 1.0000 | ORAL_TABLET | Freq: Two times a day (BID) | ORAL | 0 refills | Status: AC

## 2016-12-22 NOTE — Patient Instructions (Addendum)
IF you received an x-ray today, you will receive an invoice from [facility]. Please contact [facility] at [phone] with questions or concerns regarding your invoice.   IF you received labwork today, you will receive an invoice from [facility]. Please contact [facility] at [phone] with questions or concerns regarding your invoice.   Our billing staff will not be able to assist you with questions regarding bills from these companies.  You will be contacted with the lab results as soon as they are available. The fastest way to get your results is to activate your My Chart account. Instructions are located on the last page of this paperwork. If you have not heard from [place] regarding the results in 2 weeks, please contact this office.    We recommend that you schedule a mammogram for breast cancer screening. Typically, you do not need a referral to do this. Please contact a local imaging center to schedule your mammogram.  [facility] - [phone]  *ask for the Radiology Department [facility] - [phone] or [phone]  [facility] - [phone] [place] [phone] [facility] - [phone]  *ask for the [facility] - [phone]  *ask for the Radiology Department [facility] - [phone]  *ask for the [place] - [phone] Dysuria is pain or discomfort while urinating. The pain or discomfort may be felt in the tube that carries urine out of the bladder (urethra) or in the surrounding tissue of the genitals. The pain may also be felt in the groin area, lower abdomen, and lower back. You may have to urinate frequently or have the sudden feeling that you have to urinate (urgency). Dysuria can affect both men and women, but is more common in women. Dysuria can be caused by many different  things, including:  Urinary tract infection in women.  Infection of the kidney or bladder.  Kidney stones or bladder stones.  Certain sexually transmitted infections (STIs), such as chlamydia.  Dehydration.  Inflammation of the vagina.  Use of certain medicines.  Use of certain soaps or scented products that cause irritation.  Follow these instructions at home: Watch your dysuria for any changes. The following actions may help to reduce any discomfort you are feeling:  Drink enough fluid to keep your urine clear or pale yellow.  Empty your bladder often. Avoid holding urine for long periods of time.  After a bowel movement or urination, women should cleanse from front to back, using each tissue only once.  Empty your bladder after sexual intercourse.  Take medicines only as directed by your health care provider.  If you were prescribed an antibiotic medicine, finish it all even if you start to feel better.  Avoid caffeine, tea, and alcohol. They can irritate the bladder and make dysuria worse. In men, alcohol may irritate the prostate.  Keep all follow-up visits as directed by your health care provider. This is important.  If you had any tests done to find the cause of dysuria, it is your responsibility to obtain your test results. Ask the lab or department performing the test when and how you will get your results. Talk with your health care provider if you have any questions about your results.  Contact a health care provider if:  You develop pain in your back or sides.  You have a fever.  You have nausea or  vomiting.  You have blood in your urine.  You are not urinating as often as you usually do. Get help right away if:  You pain is severe and not relieved with medicines.  You are unable to hold down any fluids.  You or someone else notices a change in your mental function.  You have a rapid heartbeat at rest.  You have shaking or chills.  You feel  extremely weak. This information is not intended to replace advice given to you by your health care provider. Make sure you discuss any questions you have with your health care provider. Document Released: 12/21/2003 Document Revised: 08/30/2015 Document Reviewed: 11/17/2013 Elsevier Interactive Patient Education  [employer].

## 2016-12-22 NOTE — Progress Notes (Signed)
Chief Complaint  Patient presents with  . Urinary Tract Infection    possible, taking Azo since saturday and drinking water/cranberry juice, c/o low back pain. Hx of uti's. Some burning with urination.    HPI  Urinary Tract Infection: Patient complains of burning with urination, dysuria, frequency, incomplete bladder emptying and suprapubic pressure She has had symptoms for 3 days. Patient also complains of back pain. Patient denies fever and vaginal discharge. Patient does not have a history of recurrent UTI.  Patient does not have a history of pyelonephritis.  She reports that she started AZO on Saturday, 3 days ago  Past Medical History:  Diagnosis Date  . Hypertension   . Vertigo     Current Outpatient Prescriptions  Medication Sig Dispense Refill  . aspirin 81 MG tablet Take 81 mg by mouth daily.    . clonazePAM (KLONOPIN) 0.5 MG tablet Take 1 tablet (0.5 mg total) by mouth 2 (two) times daily as needed (vertigo). 20 tablet 1  . hydrochlorothiazide (HYDRODIURIL) 25 MG tablet Take 1 tablet (25 mg total) by mouth daily. 30 tablet 0  . meloxicam (MOBIC) 15 MG tablet Take 1 tablet (15 mg total) by mouth daily. 30 tablet 1  . Multiple Vitamin (MULTI VITAMIN DAILY PO) Take 1 tablet by mouth.    . sulfamethoxazole-trimethoprim (BACTRIM DS,SEPTRA DS) 800-160 MG tablet Take 1 tablet by mouth 2 (two) times daily. 10 tablet 0   No current facility-administered medications for this visit.     Allergies:  Allergies  Allergen Reactions  . Penicillins Swelling    Past Surgical History:  Procedure Laterality Date  . CHOLECYSTECTOMY    . FOOT SURGERY    . GASTRIC BYPASS     15 years ago at least  . TUBAL LIGATION      Social History   Social History  . Marital status: Married    Spouse name: Therron  . Number of children: 2  . Years of education: 12   Occupational History  . [employer]     Social History Main Topics  . Smoking status: Former Research scientist (life sciences)  . Smokeless  tobacco: Never Used  . Alcohol use No  . Drug use: No  . Sexual activity: Not Asked   Other Topics Concern  . None   Social History Narrative   Lives at home with husband.   Right handed.    Caffeine use:  Drink Dr. Malachi Bonds (16oz/day), rare coffee, some tea.     ROS Review of Systems See HPI Constitution: No fevers or chills No malaise No diaphoresis Skin: No rash or itching Eyes: no blurry vision, no double vision GU: no dysuria or hematuria Neuro: no dizziness or headaches   Objective: Vitals:   12/22/16 1423  BP: 123/83  Pulse: 63  Resp: 17  Temp: 97.6 F (36.4 C)  TempSrc: Oral  SpO2: 97%  Weight: 245 lb 9.6 oz (111.4 kg)  Height: 5\' 5"  (1.651 m)    Physical Exam  Constitutional: She is oriented to person, place, and time. She appears well-developed and well-nourished.  HENT:  Head: Normocephalic and atraumatic.  Eyes: Conjunctivae and EOM are normal.  Cardiovascular: Normal rate, regular rhythm and normal heart sounds.   Pulmonary/Chest: Effort normal and breath sounds normal. No respiratory distress. She has no wheezes.  Neurological: She is alert and oriented to person, place, and time.  Skin: Skin is warm. Capillary refill takes less than 2 seconds.  Psychiatric: She has a normal mood and affect. Her  behavior is normal. Judgment and thought content normal.    Assessment and Plan Jaquasha was seen today for urinary tract infection.  Diagnoses and all orders for this visit:  Dysuria -     POCT urinalysis dipstick -     Urine Culture  Other orders -     sulfamethoxazole-trimethoprim (BACTRIM DS,SEPTRA DS) 800-160 MG tablet; Take 1 tablet by mouth 2 (two) times daily.  will treat this Penicillin allergic patient with bactrim DS and send urine culture Continue AZO and lots of fluids   Halbert Jesson A Nolon Rod

## 2016-12-23 LAB — URINE CULTURE

## 2016-12-31 ENCOUNTER — Ambulatory Visit (INDEPENDENT_AMBULATORY_CARE_PROVIDER_SITE_OTHER): Payer: Medicare Other | Admitting: Family Medicine

## 2016-12-31 ENCOUNTER — Encounter: Payer: Self-pay | Admitting: Family Medicine

## 2016-12-31 VITALS — BP 118/70 | HR 94 | Temp 97.5°F | Resp 16 | Ht 65.0 in | Wt 246.6 lb

## 2016-12-31 DIAGNOSIS — M778 Other enthesopathies, not elsewhere classified: Secondary | ICD-10-CM

## 2016-12-31 DIAGNOSIS — M7750 Other enthesopathy of unspecified foot: Secondary | ICD-10-CM | POA: Diagnosis not present

## 2016-12-31 DIAGNOSIS — M779 Enthesopathy, unspecified: Principal | ICD-10-CM

## 2016-12-31 NOTE — Patient Instructions (Addendum)
[facility] appt made with Dr. Paulla Dolly 01/01/17 at 9:15pm   IF you received an x-ray today, you will receive an invoice from [facility]. Please contact [facility] at [phone] with questions or concerns regarding your invoice.   IF you received labwork today, you will receive an invoice from [facility]. Please contact [facility] at [phone] with questions or concerns regarding your invoice.   Our billing staff will not be able to assist you with questions regarding bills from these companies.  You will be contacted with the lab results as soon as they are available. The fastest way to get your results is to activate your My Chart account. Instructions are located on the last page of this paperwork. If you have not heard from [place] regarding the results in 2 weeks, please contact this office.     Tendinitis Tendinitis is inflammation of a tendon. A tendon is a strong cord of tissue that connects muscle to bone. Tendinitis can affect any tendon, but it most commonly affects the shoulder tendon (rotator cuff), ankle tendon (Achilles tendon), elbow tendon (triceps tendon), or one of the tendons in the wrist. What are the causes? This condition may be caused by:  Overusing a tendon or muscle. This is common.  Age-related wear and tear.  Injury.  Inflammatory conditions, such as arthritis.  Certain medicines.  What increases the risk? This condition is more likely to develop in people who do activities that involve repetitive motions. What are the signs or symptoms? Symptoms of this condition may include:  Pain.  Tenderness.  Mild swelling.  How is this diagnosed? This condition is diagnosed with a physical exam. You may also have tests, such as:  Ultrasound. This uses sound waves to make an image of your affected area.  MRI.  How is this treated? This condition may be treated by resting, icing, applying pressure (compression), and raising  (elevating) the area above the level of your heart. This is known as RICE therapy. Treatment may also include:  Medicines to help reduce inflammation or to help reduce pain.  Exercises or physical therapy to strengthen and stretch the tendon.  A brace or splint.  Surgery (rare).  Follow these instructions at home:  If you have a splint or brace:  Wear the splint or brace as told by your health care provider. Remove it only as told by your health care provider.  Loosen the splint or brace if your fingers or toes tingle, become numb, or turn cold and blue.  Do not take baths, swim, or use a hot tub until your health care provider approves. Ask your health care provider if you can take showers. You may only be allowed to take sponge baths for bathing.  Do not let your splint or brace get wet if it is not waterproof. ? If your splint or brace is not waterproof, cover it with a watertight plastic bag when you take a bath or a shower.  Keep the splint or brace clean. Managing pain, stiffness, and swelling  If directed, apply ice to the affected area. ? Put ice in a plastic bag. ? Place a towel between your skin and the bag. ? Leave the ice on for 20 minutes, 2-3 times a day.  If directed, apply heat to the affected area as often as told by your health care provider. Use the heat source that your health care provider recommends, such as a moist heat pack or a heating pad. ? Place a  towel between your skin and the heat source. ? Leave the heat on for 20-30 minutes. ? Remove the heat if your skin turns bright red. This is especially important if you are unable to feel pain, heat, or cold. You may have a greater risk of getting burned.  Move the fingers or toes of the affected limb often, if this applies. This can help to prevent stiffness and lessen swelling.  If directed, elevate the affected area above the level of your heart while you are sitting or lying down. Driving  Do not  drive or operate heavy machinery while taking prescription pain medicine.  Ask your health care provider when it is safe to drive if you have a splint or brace on any part of your arm or leg. Activity  Return to your normal activities as told by your health care provider. Ask your health care provider what activities are safe for you.  Rest the affected area as told by your health care provider.  Avoid using the affected area while you are experiencing symptoms of tendinitis.  Do exercises as told by your health care provider. General instructions  If you have a splint, do not put pressure on any part of the splint until it is fully hardened. This may take several hours.  Wear an elastic bandage or compression wrap only as told by your health care provider.  Take over-the-counter and prescription medicines only as told by your health care provider.  Keep all follow-up visits as told by your health care provider. This is important. Contact a health care provider if:  Your symptoms do not improve.  You develop new, unexplained problems, such as numbness in your hands. This information is not intended to replace advice given to you by your health care provider. Make sure you discuss any questions you have with your health care provider. Document Released: 03/21/2000 Document Revised: 11/22/2015 Document Reviewed: 12/25/2014 Elsevier Interactive Patient Education  [employer].

## 2016-12-31 NOTE — Progress Notes (Signed)
  Chief Complaint  Patient presents with  . pain and swelling on top of left foot, no injury, inflammed     onset: Sat afternoon or Sunday morning, no injury, taking ibuprofen/flexeril with no relief.  Pain level 8/10    HPI  Onset: 4 days ago  Left foot pain that seems to radiate from the ankle to the space between the big toe and toe 2 Pt took some antiinflammatory pills, ibuprofen, linament as well as flexeril Nothing has been helping She is limping as a result She stands all day on her feet at work  Her Dr. Regal is her Podiatrist at [facility] She states that she has been seen for many years   Past Medical History:  Diagnosis Date  . Hypertension   . Vertigo     Current Outpatient Prescriptions  Medication Sig Dispense Refill  . aspirin 81 MG tablet Take 81 mg by mouth daily.    . clonazePAM (KLONOPIN) 0.5 MG tablet Take 1 tablet (0.5 mg total) by mouth 2 (two) times daily as needed (vertigo). 20 tablet 1  . hydrochlorothiazide (HYDRODIURIL) 25 MG tablet Take 1 tablet (25 mg total) by mouth daily. 30 tablet 0  . meloxicam (MOBIC) 15 MG tablet Take 1 tablet (15 mg total) by mouth daily. 30 tablet 1  . Multiple Vitamin (MULTI VITAMIN DAILY PO) Take 1 tablet by mouth.     No current facility-administered medications for this visit.     Allergies:  Allergies  Allergen Reactions  . Penicillins Swelling    Past Surgical History:  Procedure Laterality Date  . CHOLECYSTECTOMY    . FOOT SURGERY    . GASTRIC BYPASS     15  years ago at least  . [place] History   Social History  . Marital status: Married    Spouse name: Therron  . Number of children: 2  . Years of education: 12   Occupational History  . [employer]     Social History Main Topics  . Smoking status: Former Research scientist (life sciences)  . Smokeless tobacco: Never Used  . Alcohol use No  . Drug use: No  . Sexual activity: Not Asked   Other Topics Concern  . None   Social  History Narrative   Lives at home with husband.   Right handed.    Caffeine use:  Drink Dr. Malachi Bonds (16oz/day), rare coffee, some tea.     ROS  Objective: Vitals:   12/31/16 1407  BP: 118/70  Pulse: 94  Resp: 16  Temp: (!) 97.5 F (36.4 C)  TempSrc: Oral  SpO2: 93%  Weight: 246 lb 9.6 oz (111.9 kg)  Height: 5\' 5"  (1.651 m)    Physical Exam  Constitutional: She appears well-developed and well-nourished.  HENT:  Head: Normocephalic and atraumatic.  Eyes: Conjunctivae and EOM are normal.  Pulmonary/Chest: Effort normal.  Musculoskeletal:       Feet:  Psychiatric: She has a normal mood and affect. Her behavior is normal. Judgment and thought content normal.    Assessment and Plan Oswin was seen today for pain and swelling on top of left foot, no injury, inflammed .  Diagnoses and all orders for this visit:  Flexor hallucis longus tendonitis  Called [facility] appt made with Dr. Paulla Dolly 01/01/17 at 9:15am Continue NSAIDs and icing  [place]

## 2017-01-01 ENCOUNTER — Other Ambulatory Visit: Payer: Self-pay | Admitting: Podiatry

## 2017-01-01 ENCOUNTER — Ambulatory Visit (INDEPENDENT_AMBULATORY_CARE_PROVIDER_SITE_OTHER): Payer: Medicare Other

## 2017-01-01 ENCOUNTER — Encounter: Payer: Self-pay | Admitting: Podiatry

## 2017-01-01 ENCOUNTER — Ambulatory Visit (INDEPENDENT_AMBULATORY_CARE_PROVIDER_SITE_OTHER): Payer: Managed Care, Other (non HMO) | Admitting: Podiatry

## 2017-01-01 DIAGNOSIS — M79672 Pain in left foot: Secondary | ICD-10-CM

## 2017-01-01 DIAGNOSIS — M779 Enthesopathy, unspecified: Secondary | ICD-10-CM

## 2017-01-01 DIAGNOSIS — M7752 Other enthesopathy of left foot: Secondary | ICD-10-CM

## 2017-01-01 DIAGNOSIS — Q828 Other specified congenital malformations of skin: Secondary | ICD-10-CM

## 2017-01-01 MED ORDER — TRIAMCINOLONE ACETONIDE 10 MG/ML IJ SUSP
10.0000 mg | Freq: Once | INTRAMUSCULAR | Status: AC
  Administered 2017-01-01: 10 mg

## 2017-01-01 NOTE — Progress Notes (Signed)
Subjective:    Patient ID: Janet Leblanc, female   DOB: 66 y.o.   MRN: [id number]   HPI patient states I developed a lot of pain around the big toe joint left and I have this lesion on the fifth metatarsal and the head in the base that have become sore again    ROS      Objective:  Physical Exam neurovascular status intact with inflammation that's proximal to the MPJ left with the extensor complex been the most irritated and 2 painful keratotic lesions left     Assessment:    Probability for extensor tendinitis left along with porokeratotic lesions left     Plan:   X-ray reviewed and H&P and today I did a careful sheath injection and left 3 mg Kenalog 5 mg Xylocaine to reduce inflammation and using sterile instrumentation I debrided lesions on the left with no iatrogenic bleeding noted

## 2017-01-08 ENCOUNTER — Encounter: Payer: Self-pay | Admitting: Podiatry

## 2017-01-08 ENCOUNTER — Ambulatory Visit (INDEPENDENT_AMBULATORY_CARE_PROVIDER_SITE_OTHER): Payer: Medicare Other | Admitting: Podiatry

## 2017-01-08 DIAGNOSIS — M779 Enthesopathy, unspecified: Secondary | ICD-10-CM

## 2017-01-08 NOTE — Progress Notes (Signed)
Subjective:    Patient ID: Janet Leblanc, female   DOB: 66 y.o.   MRN: [id number]   HPI patient states she still having a lot of pain on top of her left foot and the medication only helped her a little bit    ROS      Objective:  Physical Exam neurovascular status intact with inflammation pain on the dorsum of the left foot around the tendon complex extensor as it comes to the hallux. It is mostly in the midfoot and distal     Assessment:   Chronic tendinitis of the left foot that did not respond to injection therapy reduced activity ice therapy      Plan:    Due to the pain in the fact it's mostly when she walks I placed her into an air fracture walker to immobilize and prevent pressure that's occurring against it. Patient will wear this for the next 2 weeks and that hopefully gradually reduce it and will be seen back in 4 weeks

## 2017-01-10 ENCOUNTER — Ambulatory Visit: Payer: Managed Care, Other (non HMO) | Admitting: Physician Assistant

## 2017-02-05 ENCOUNTER — Ambulatory Visit: Payer: Managed Care, Other (non HMO) | Admitting: Podiatry

## 2017-02-13 ENCOUNTER — Other Ambulatory Visit: Payer: Self-pay | Admitting: Physician Assistant

## 2017-02-13 DIAGNOSIS — I10 Essential (primary) hypertension: Secondary | ICD-10-CM

## 2017-02-17 ENCOUNTER — Telehealth: Payer: Self-pay | Admitting: Physician Assistant

## 2017-02-17 DIAGNOSIS — I10 Essential (primary) hypertension: Secondary | ICD-10-CM

## 2017-02-17 NOTE — Telephone Encounter (Signed)
Patient would like a refill of hydrochlorothiazide (HYDRODIURIL) 25 MG tablet . She can be reached at [phone].  FR

## 2017-02-18 MED ORDER — HYDROCHLOROTHIAZIDE 25 MG PO TABS: 25.0000 mg | ORAL_TABLET | Freq: Every day | ORAL | 0 refills | Status: DC

## 2017-02-22 ENCOUNTER — Ambulatory Visit (HOSPITAL_COMMUNITY)
Admission: EM | Admit: 2017-02-22 | Discharge: 2017-02-22 | Disposition: A | Discharge status: Home / Self Care | Payer: Managed Care, Other (non HMO) | Attending: Family Medicine | Admitting: Family Medicine

## 2017-02-22 ENCOUNTER — Encounter (HOSPITAL_COMMUNITY): Payer: Self-pay | Admitting: Emergency Medicine

## 2017-02-22 ENCOUNTER — Other Ambulatory Visit: Payer: Self-pay

## 2017-02-22 DIAGNOSIS — K0889 Other specified disorders of teeth and supporting structures: Secondary | ICD-10-CM

## 2017-02-22 MED ORDER — HYDROCODONE-ACETAMINOPHEN 5-325 MG PO TABS: 1.0000 | ORAL_TABLET | ORAL | 0 refills | Status: DC | PRN

## 2017-02-22 MED ORDER — CLINDAMYCIN HCL 300 MG PO CAPS: 300.0000 mg | ORAL_CAPSULE | Freq: Three times a day (TID) | ORAL | 0 refills | Status: DC

## 2017-02-22 NOTE — ED Provider Notes (Signed)
Janet Leblanc    CSN: [id number] Arrival date & time: 02/22/17  1841     History   Chief Complaint Chief Complaint  Patient presents with  . URI    HPI Janet Leblanc is a 66 y.o. female.   66 year old female complaining of pain across the left alveolar ridge anteriorly. She states it hurts worse when she claims her teeth together. This started 2 days ago. Denies having any recent dental work or problems. She has been taking Aleve with partial relief.      Past Medical History:  Diagnosis Date  . Hypertension   . Vertigo     Patient Active Problem List   Diagnosis Date Noted  . Benign essential HTN 03/24/2016  . 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 Tinnitus of both ears 06/08/2014    Past Surgical History:  Procedure Laterality Date  . CHOLECYSTECTOMY    . FOOT SURGERY    . GASTRIC BYPASS     15 years ago at least  . TUBAL LIGATION      OB History    No data available       Home Medications    Prior to Admission medications   Medication Sig Start Date End Date Taking? Authorizing Provider  aspirin 81 MG tablet Take 81 mg by mouth daily.    [provider]  clindamycin (CLEOCIN) 300 MG capsule Take 1 capsule (300 mg total) 3 (three) times daily by mouth. 02/22/17   Jaylaa Gallion, Shanon Brow, NP  clonazePAM (KLONOPIN) 0.5 MG tablet Take 1 tablet (0.5 mg total) by mouth 2 (two) times daily as needed (vertigo). 06/03/14   Shawnee Knapp, MD  hydrochlorothiazide (HYDRODIURIL) 25 MG tablet Take 1 tablet (25 mg total) daily by mouth. 02/18/17   Harrison Mons, PA-C  HYDROcodone-acetaminophen (NORCO/VICODIN) 5-325 MG tablet Take 1 tablet every 4 (four) hours as needed by mouth. 02/22/17   Janne Napoleon, NP  meloxicam (MOBIC) 15 MG tablet Take 1 tablet (15 mg total) by mouth daily. 03/25/16   Harrison Mons, PA-C  Multiple Vitamin (MULTI VITAMIN DAILY PO) Take 1 tablet by mouth.    [provider]    Family History Family  History  Problem Relation Age of Onset  . Heart disease Father   . Hypertension Father   . Heart disease Brother   . Stroke Brother   . Heart disease Paternal Grandmother   . Cancer Paternal Grandfather   . Migraines Neg Hx     Social History Social History   Tobacco Use  . Smoking status: Former Research scientist (life sciences)  . Smokeless tobacco: Never Used  Substance Use Topics  . Alcohol use: No    Alcohol/week: 0.0 oz  . Drug use: No     Allergies   Penicillins   Review of Systems Review of Systems  Constitutional: Negative.   HENT: Positive for dental problem.   All other systems reviewed and are negative.    Physical Exam Triage Vital Signs ED Triage Vitals  Enc Vitals Group     BP 02/22/17 1851 (!) 154/69     Pulse Rate 02/22/17 1851 90     Resp 02/22/17 1851 (!) 22     Temp 02/22/17 1851 97.9 F (36.6 C)     Temp Source 02/22/17 1851 Oral     SpO2 02/22/17 1851 100 %     Weight --      Height --      Head Circumference --  Peak Flow --      Pain Score 02/22/17 1849 8     Pain Loc --      Pain Edu? --      Excl. in [place]? --    No data found.  Updated Vital Signs BP (!) 154/69 (BP Location: Left Arm) Comment (BP Location): large cuff  Pulse 90   Temp 97.9 F (36.6 C) (Oral)   Resp (!) 22   SpO2 100%   Visual Acuity Right Eye Distance:   Left Eye Distance:   Bilateral Distance:    Right Eye Near:   Left Eye Near:    Bilateral Near:     Physical Exam  Constitutional: She is oriented to person, place, and time. She appears well-developed and well-nourished. No distress.  HENT:  Nose: Nose normal.  Mouth/Throat: Oropharynx is clear and moist.  No swelling seen over the lip or under the nose. Examination of the mucosal layer under the upper lip as well as the gingiva and tooth reveals localized tenderness along the left lateral incisor and bicuspid. Positive for dental tenderness. No swelling or erythema or evidence of abscess.  Neurological: She is alert  and oriented to person, place, and time.  Skin: Skin is warm and dry.  Psychiatric: She has a normal mood and affect.  Nursing note and vitals reviewed.    UC Treatments / Results  Labs (all labs ordered are listed, but only abnormal results are displayed) Labs Reviewed - No data to display  EKG  EKG Interpretation None       Radiology No results found.  Procedures Procedures (including critical care time)  Medications Ordered in UC Medications - No data to display   Initial Impression / Assessment and Plan / UC Course  I have reviewed the triage vital signs and the nursing notes.  Pertinent labs & imaging results that were available during my care of the patient were reviewed by me and considered in my medical decision making (see chart for details).    Take the antibiotic as directed. If you get diarrhea stop the medicine. The pain med can cause drowsiness, dizziness and falls, BE CAREFULL.     Final Clinical Impressions(s) / UC Diagnoses   Final diagnoses:  Pain, dental    ED Discharge Orders        Ordered    HYDROcodone-acetaminophen (NORCO/VICODIN) 5-325 MG tablet  Every 4 hours PRN     02/22/17 1908    clindamycin (CLEOCIN) 300 MG capsule  3 times daily     02/22/17 1908       Controlled Substance Prescriptions [place] Controlled Substance Registry consulted? Not Applicable   Janne Napoleon, NP 02/22/17 1919

## 2017-02-22 NOTE — Discharge Instructions (Signed)
Take the antibiotic as directed. If you get diarrhea stop the medicine. The pain med can cause drowsiness, dizziness and falls, BE CAREFULL.

## 2017-02-22 NOTE — ED Triage Notes (Signed)
Patient say mouth pain started Friday, pain worsened yesterday.  Patient touches area between lip and nose and are of pain.  Patient does have a runny nose

## 2017-03-18 NOTE — Progress Notes (Signed)
Chief Complaint  Patient presents with  . Medication Refill    hydrochlorithiazide 25 mg    HPI   Hypertension: Patient here for follow-up of elevated blood pressure. She is not exercising and is adherent to low salt diet.  Blood pressure is well controlled at home. Cardiac symptoms muscle cramps. Patient denies chest pain, claudication, fatigue, lower extremity edema, near-syncope and palpitations.  Cardiovascular risk factors: advanced age (older than 50 for men, 8 for women), hypertension and obesity (BMI >= 30 kg/m2). Use of agents associated with hypertension: none. History of target organ damage: none. BP Readings from Last 3 Encounters:  03/19/17 120/76  02/22/17 (!) 154/69  12/31/16 118/70   Lab Results  Component Value Date   CREATININE 1.08 (H) 01/15/2015   She reports that she had gastric bypass surgery and lost 200 pounds and regained 40 pounds She does not exercise but is very active at work Her surgery was over 20 years ago Wt Readings from Last 3 Encounters:  03/19/17 250 lb (113.4 kg)  12/31/16 246 lb 9.6 oz (111.9 kg)  12/22/16 245 lb 9.6 oz (111.4 kg)  Body mass index is 41.6 kg/m.   Her last pap smear was so long ago she does not remember No recent mammogram Never had a colonoscopy  Past Medical History:  Diagnosis Date  . Hypertension   . Vertigo     Current Outpatient Medications  Medication Sig Dispense Refill  . aspirin 81 MG tablet Take 81 mg by mouth daily.    . clonazePAM (KLONOPIN) 0.5 MG tablet Take 1 tablet (0.5 mg total) by mouth 2 (two) times daily as needed (vertigo). 20 tablet 1  . hydrochlorothiazide (HYDRODIURIL) 25 MG tablet Take 1 tablet (25 mg total) daily by mouth. 30 tablet 0  . HYDROcodone-acetaminophen (NORCO/VICODIN) 5-325 MG tablet Take 1 tablet every 4 (four) hours as needed by mouth. 15 tablet 0  . meloxicam (MOBIC) 15 MG tablet Take 1 tablet (15 mg total) by mouth daily. 30 tablet 1  . Multiple Vitamin (MULTI VITAMIN  DAILY PO) Take 1 tablet by mouth.    . clindamycin (CLEOCIN) 300 MG capsule Take 1 capsule (300 mg total) 3 (three) times daily by mouth. (Patient not taking: Reported on 03/19/2017) 20 capsule 0   No current facility-administered medications for this visit.     Allergies:  Allergies  Allergen Reactions  . Penicillins Swelling    Past Surgical History:  Procedure Laterality Date  . CHOLECYSTECTOMY    . FOOT SURGERY    . GASTRIC BYPASS     15 years ago at least  . TUBAL LIGATION      Social History   Socioeconomic History  . Marital status: Married    Spouse name: Therron  . Number of children: 2  . Years of education: 57  . Highest education level: None  Social Needs  . Financial resource strain: None  . Food insecurity - worry: None  . Food insecurity - inability: None  . Transportation needs - medical: None  . Transportation needs - non-medical: None  Occupational History  . Occupation: Animator   Tobacco Use  . Smoking status: Former Research scientist (life sciences)  . Smokeless tobacco: Never Used  Substance and Sexual Activity  . Alcohol use: No    Alcohol/week: 0.0 oz  . Drug use: No  . Sexual activity: None  Other Topics Concern  . None  Social History Narrative   Lives at home with husband.   Right handed.  Caffeine use:  Drink Dr. Malachi Bonds (16oz/day), rare coffee, some tea.     Family History  Problem Relation Age of Onset  . Heart disease Father   . Hypertension Father   . Heart disease Brother   . Stroke Brother   . Heart disease Paternal Grandmother   . Cancer Paternal Grandfather   . Migraines Neg Hx      ROS Review of Systems See HPI Constitution: No fevers or chills No malaise No diaphoresis Skin: No rash or itching Eyes: no blurry vision, no double vision GU: no dysuria or hematuria Neuro: no dizziness or headaches all others reviewed and negative   Objective: Vitals:   03/19/17 1404  BP: 120/76  Pulse: (!) 105  Resp: 16  Temp: 97.6 F  (36.4 C)  TempSrc: Oral  SpO2: 98%  Weight: 250 lb (113.4 kg)  Height: 5\' 5"  (1.651 m)    Physical Exam  Constitutional: She is oriented to person, place, and time. She appears well-developed and well-nourished.  HENT:  Head: Normocephalic and atraumatic.  Eyes: Conjunctivae and EOM are normal.  Cardiovascular: Normal rate, regular rhythm and normal heart sounds.  No murmur heard. Pulmonary/Chest: Effort normal and breath sounds normal. No stridor. No respiratory distress.  Neurological: She is alert and oriented to person, place, and time. No cranial nerve deficit.  Skin: Skin is warm. Capillary refill takes less than 2 seconds.  Psychiatric: She has a normal mood and affect. Her behavior is normal. Judgment and thought content normal.    Assessment and Plan Marit was seen today for medication refill.  Diagnoses and all orders for this visit:  Benign essential HTN-  Stable on hctz Will check lytes Continue dash diet Add exercise -     Lipid panel -     Comprehensive metabolic panel  Morbid obesity ([place]) Discussed the importance of exercise to improve bp  -     Lipid panel -     Comprehensive metabolic panel  Screening for colon cancer -     Ambulatory referral to Gastroenterology  Screening for breast cancer -     MM Digital Diagnostic Bilat; Future  Screening, lipid -     Lipid panel  Menopause -  Will screen for bone density in this 66yo postmenopausal female She is low risk  -     DG Bone Density; Future  Need for hepatitis C screening test -     HCV Ab w/Rflx to Verification   A total of 25 minutes were spent face-to-face with the patient during this encounter and over half of that time was spent on counseling and coordination of care.   [place]

## 2017-03-19 ENCOUNTER — Encounter: Payer: Self-pay | Admitting: Family Medicine

## 2017-03-19 ENCOUNTER — Other Ambulatory Visit: Payer: Self-pay

## 2017-03-19 ENCOUNTER — Ambulatory Visit: Payer: Medicare Other | Admitting: Family Medicine

## 2017-03-19 VITALS — BP 120/76 | HR 105 | Temp 97.6°F | Resp 16 | Ht 65.0 in | Wt 250.0 lb

## 2017-03-19 DIAGNOSIS — Z1159 Encounter for screening for other viral diseases: Secondary | ICD-10-CM

## 2017-03-19 DIAGNOSIS — I10 Essential (primary) hypertension: Secondary | ICD-10-CM

## 2017-03-19 DIAGNOSIS — Z13220 Encounter for screening for lipoid disorders: Secondary | ICD-10-CM

## 2017-03-19 DIAGNOSIS — Z780 Asymptomatic menopausal state: Secondary | ICD-10-CM

## 2017-03-19 DIAGNOSIS — Z1231 Encounter for screening mammogram for malignant neoplasm of breast: Secondary | ICD-10-CM

## 2017-03-19 DIAGNOSIS — Z1211 Encounter for screening for malignant neoplasm of colon: Secondary | ICD-10-CM

## 2017-03-19 DIAGNOSIS — Z1239 Encounter for other screening for malignant neoplasm of breast: Secondary | ICD-10-CM

## 2017-03-19 MED ORDER — HYDROCHLOROTHIAZIDE 25 MG PO TABS: 25.0000 mg | ORAL_TABLET | Freq: Every day | ORAL | 3 refills | Status: DC

## 2017-03-19 NOTE — Patient Instructions (Addendum)

## 2017-03-20 LAB — COMPREHENSIVE METABOLIC PANEL
ALK PHOS: 102 IU/L (ref 39–117)
ALT: 17 IU/L (ref 0–32)
AST: 20 IU/L (ref 0–40)
Albumin/Globulin Ratio: 1.5 (ref 1.2–2.2)
Albumin: 3.8 g/dL (ref 3.6–4.8)
BUN/Creatinine Ratio: 17 (ref 12–28)
BUN: 17 mg/dL (ref 8–27)
Bilirubin Total: 0.2 mg/dL (ref 0.0–1.2)
CALCIUM: 9 mg/dL (ref 8.7–10.3)
CO2: 26 mmol/L (ref 20–29)
CREATININE: 1.02 mg/dL — AB (ref 0.57–1.00)
Chloride: 103 mmol/L (ref 96–106)
GFR calc Af Amer: 67 mL/min/{1.73_m2} (ref >59)
GFR, EST NON AFRICAN AMERICAN: 58 mL/min/{1.73_m2} — AB (ref >59)
GLUCOSE: 124 mg/dL — AB (ref 65–99)
Globulin, Total: 2.5 g/dL (ref 1.5–4.5)
Potassium: 4 mmol/L (ref 3.5–5.2)
Sodium: 142 mmol/L (ref 134–144)
Total Protein: 6.3 g/dL (ref 6.0–8.5)

## 2017-03-20 LAB — LIPID PANEL
CHOLESTEROL TOTAL: 172 mg/dL (ref 100–199)
Chol/HDL Ratio: 5.1 ratio — ABNORMAL HIGH (ref 0.0–4.4)
HDL: 34 mg/dL — ABNORMAL LOW (ref >39)
LDL CALC: 101 mg/dL — AB (ref 0–99)
Triglycerides: 184 mg/dL — ABNORMAL HIGH (ref 0–149)
VLDL CHOLESTEROL CAL: 37 mg/dL (ref 5–40)

## 2017-03-20 LAB — HCV AB W/RFLX TO VERIFICATION

## 2017-03-20 LAB — HCV INTERPRETATION

## 2017-05-21 ENCOUNTER — Encounter: Payer: Self-pay | Admitting: Family Medicine

## 2017-06-17 IMAGING — DX DG ANKLE COMPLETE 3+V*L*
3 series · 4 of 4 positions shown · non-contrast
Comparison: None.

CLINICAL DATA: Swelling medial ankle above malleolus.  No trauma

EXAM:
LEFT ANKLE COMPLETE - 3+ VIEW

[ankle ap]
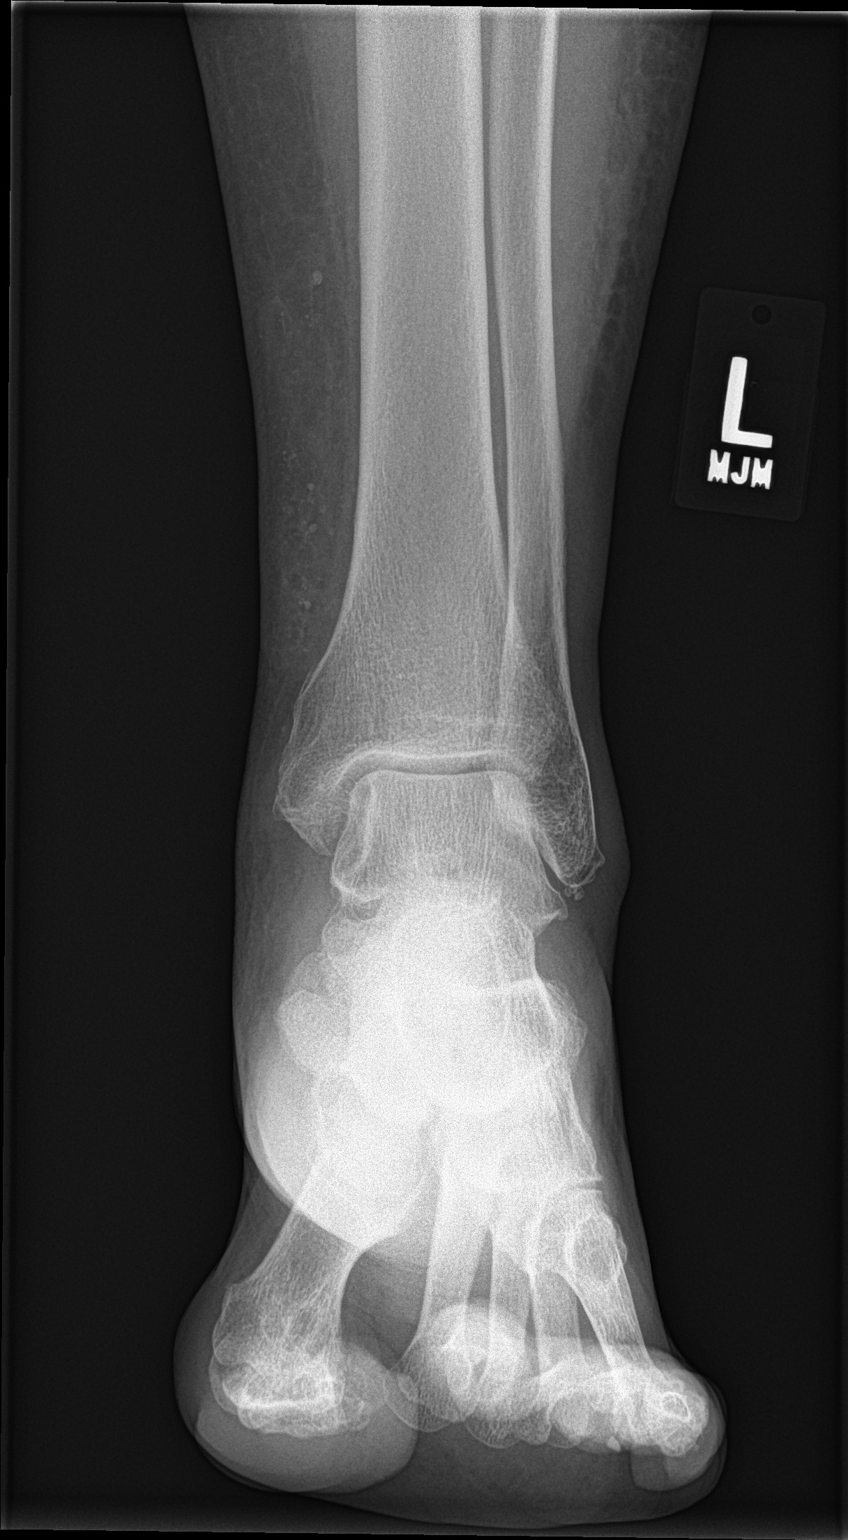

[Series 2: ankle obl · 0.14mm/px · 2 of 2 slices shown]
[im 1/2]
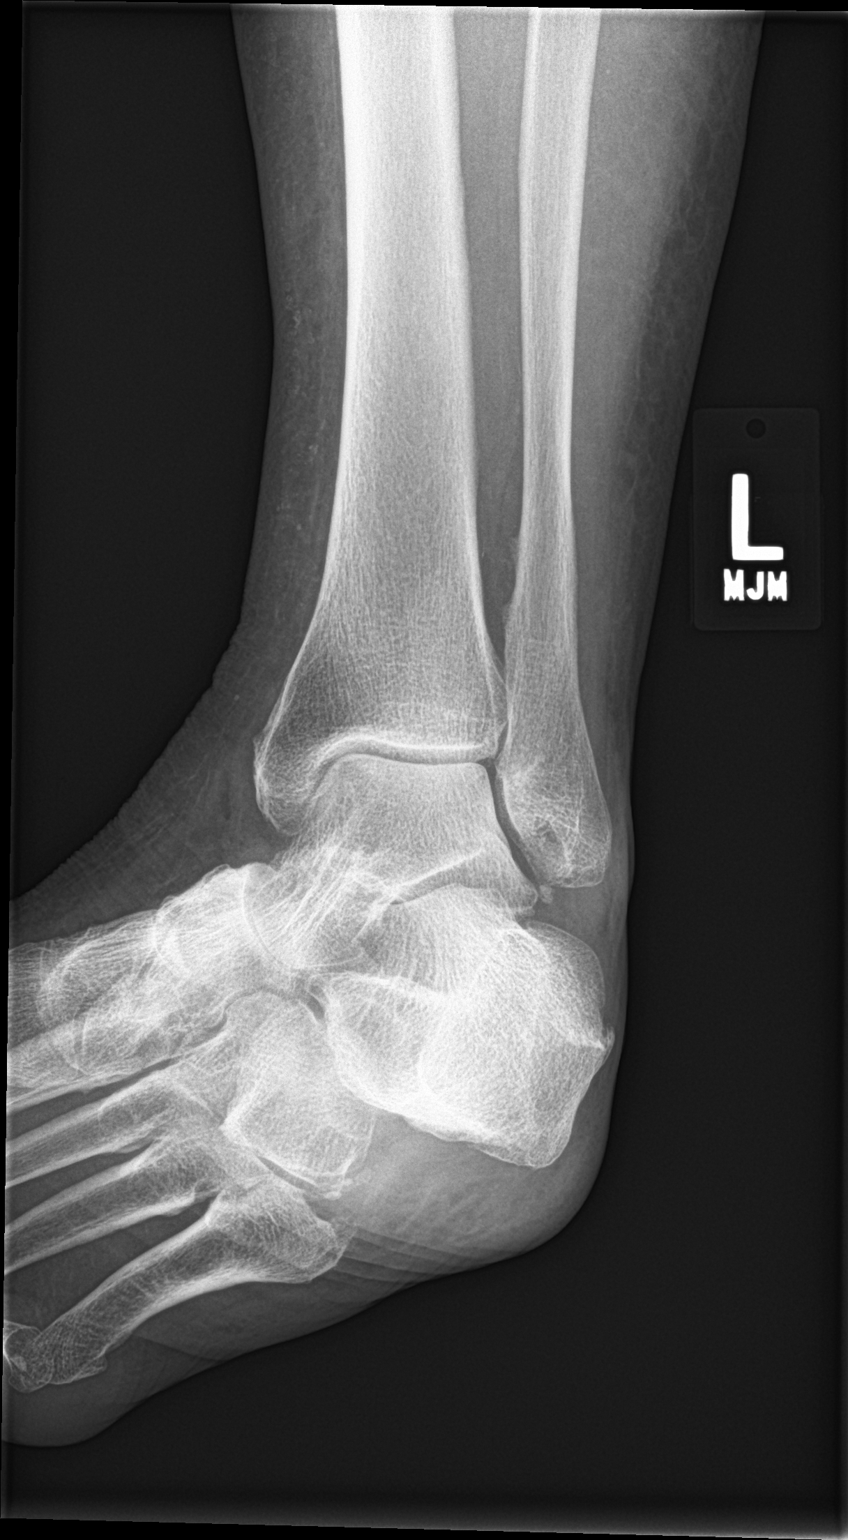
[im 2/2]
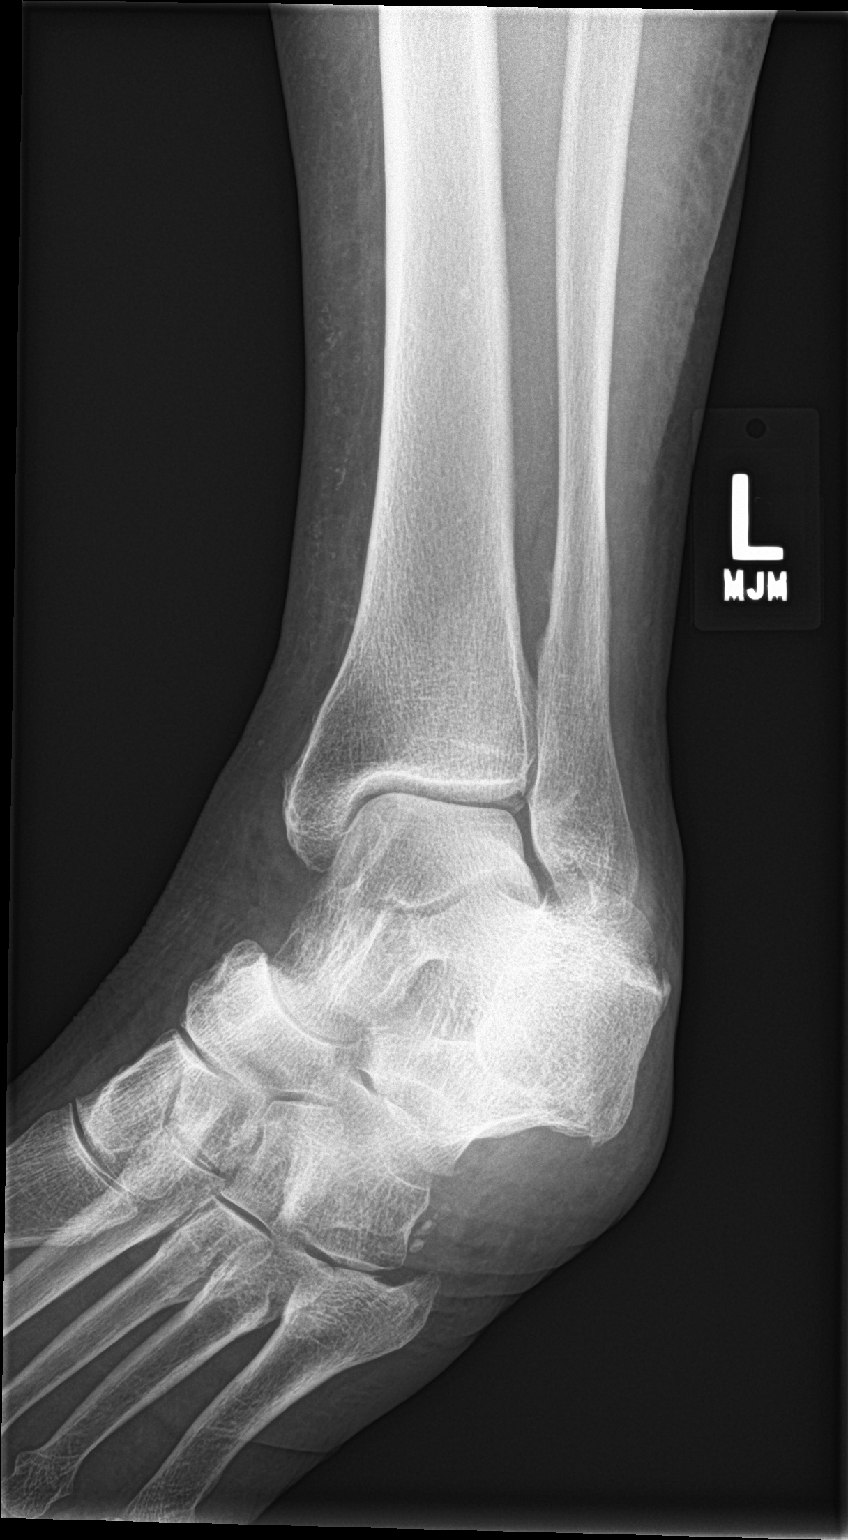

[ankle lat]
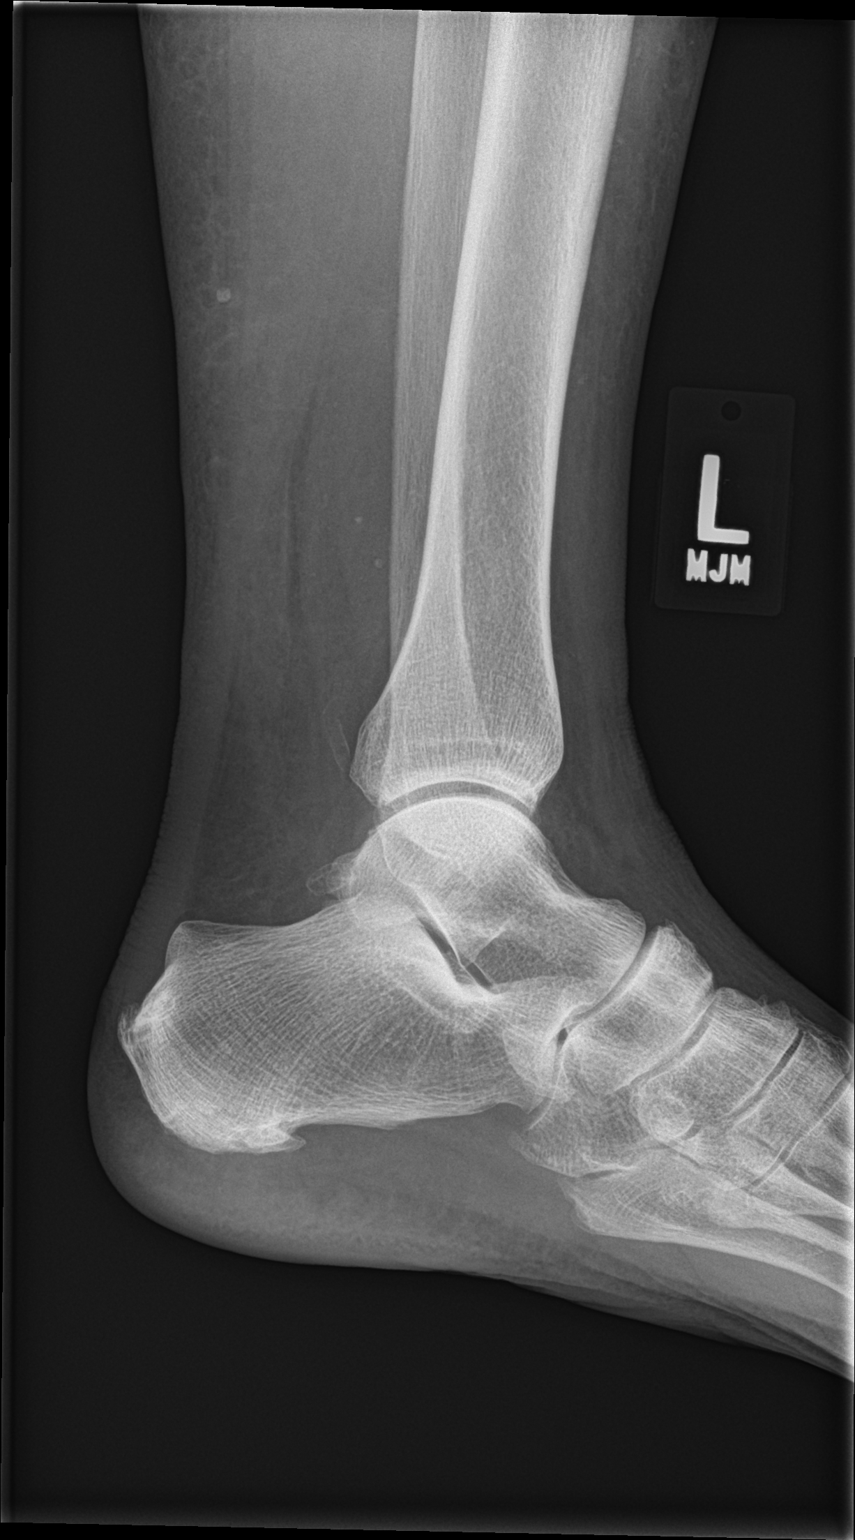

[4 of 4 positions shown; findings below may reference images not displayed]

FINDINGS: Ankle mortise intact. The talar dome is normal. No malleolar
fracture. The calcaneus is normal except for degenerate spurring.
Vascular calcifications noted
IMPRESSION: No acute osseous abnormality.

## 2017-06-23 DIAGNOSIS — H35033 Hypertensive retinopathy, bilateral: Secondary | ICD-10-CM | POA: Diagnosis not present

## 2017-07-03 ENCOUNTER — Encounter: Payer: Self-pay | Admitting: Podiatry

## 2017-07-03 ENCOUNTER — Ambulatory Visit: Payer: Medicare Other | Admitting: Podiatry

## 2017-07-03 DIAGNOSIS — Q828 Other specified congenital malformations of skin: Secondary | ICD-10-CM

## 2017-07-03 NOTE — Progress Notes (Signed)
Subjective:   Patient ID: Janet Leblanc, female   DOB: 67 y.o.   MRN: [id number]   HPI Lesions bilateral that are painful with the right foot being worse   ROS      Objective:  Physical Exam  Neurovascular status intact with keratotic lesions plantar lateral right with nucleated cores     Assessment:  Porokeratotic lesion right foot that are very painful x3     Plan:  Debride painful lesions with no iatrogenic bleeding

## 2017-09-01 DIAGNOSIS — H43821 Vitreomacular adhesion, right eye: Secondary | ICD-10-CM | POA: Diagnosis not present

## 2017-09-01 DIAGNOSIS — H43822 Vitreomacular adhesion, left eye: Secondary | ICD-10-CM | POA: Diagnosis not present

## 2017-09-21 ENCOUNTER — Encounter: Payer: Medicare Other | Admitting: Family Medicine

## 2017-12-25 ENCOUNTER — Encounter: Payer: Self-pay | Admitting: Family Medicine

## 2017-12-25 ENCOUNTER — Other Ambulatory Visit: Payer: Self-pay

## 2017-12-25 ENCOUNTER — Ambulatory Visit: Payer: Medicare Other | Admitting: Family Medicine

## 2017-12-25 VITALS — BP 152/82 | HR 71 | Temp 97.7°F | Ht 65.0 in | Wt 257.8 lb

## 2017-12-25 DIAGNOSIS — J069 Acute upper respiratory infection, unspecified: Secondary | ICD-10-CM | POA: Diagnosis not present

## 2017-12-25 DIAGNOSIS — R6889 Other general symptoms and signs: Secondary | ICD-10-CM | POA: Diagnosis not present

## 2017-12-25 LAB — POC INFLUENZA A&B (BINAX/QUICKVUE)
Influenza A, POC: NEGATIVE
Influenza B, POC: NEGATIVE

## 2017-12-25 MED ORDER — IPRATROPIUM BROMIDE 0.03 % NA SOLN: 2.0000 | Freq: Two times a day (BID) | NASAL | 0 refills | Status: DC

## 2017-12-25 MED ORDER — BENZONATATE 100 MG PO CAPS: 100.0000 mg | ORAL_CAPSULE | Freq: Three times a day (TID) | ORAL | 0 refills | Status: DC | PRN

## 2017-12-25 NOTE — Patient Instructions (Addendum)
   If you have lab work done today you will be contacted with your lab results within the next 2 weeks.  If you have not heard from us then please contact us. The fastest way to get your results is to register for My Chart.   IF you received an x-ray today, you will receive an invoice from [facility]. Please contact [facility] at [phone] with questions or concerns regarding your invoice.   IF you received labwork today, you will receive an invoice from [facility]. Please contact [facility] at [phone] with questions or concerns regarding your invoice.   Our billing staff will not be able to assist you with questions regarding bills from these companies.  You will be contacted with the lab results as soon as they are available. The fastest way to get your results is to activate your My Chart account. Instructions are located on the last page of this paperwork. If you have not heard from us regarding the results in 2 weeks, please contact this office.     Upper Respiratory Infection, Adult Most upper respiratory infections (URIs) are caused by a virus. A URI affects the nose, throat, and upper air passages. The most common type of URI is often called "the common cold." Follow these instructions at home:  Take medicines only as told by your doctor.  Gargle warm saltwater or take cough drops to comfort your throat as told by your doctor.  Use a warm mist humidifier or inhale steam from a shower to increase air moisture. This may make it easier to breathe.  Drink enough fluid to keep your pee (urine) clear or pale yellow.  Eat soups and other clear broths.  Have a healthy diet.  Rest as needed.  Go back to work when your fever is gone or your doctor says it is okay. ? You may need to stay home longer to avoid giving your URI to others. ? You can also wear a face mask and wash your hands often to prevent spread of the virus.  Use your inhaler more if you  have asthma.  Do not use any tobacco products, including cigarettes, chewing tobacco, or electronic cigarettes. If you need help quitting, ask your doctor. Contact a doctor if:  You are getting worse, not better.  Your symptoms are not helped by medicine.  You have chills.  You are getting more short of breath.  You have brown or red mucus.  You have yellow or brown discharge from your nose.  You have pain in your face, especially when you bend forward.  You have a fever.  You have puffy (swollen) neck glands.  You have pain while swallowing.  You have white areas in the back of your throat. Get help right away if:  You have very bad or constant: ? Headache. ? Ear pain. ? Pain in your forehead, behind your eyes, and over your cheekbones (sinus pain). ? Chest pain.  You have long-lasting (chronic) lung disease and any of the following: ? Wheezing. ? Long-lasting cough. ? Coughing up blood. ? A change in your usual mucus.  You have a stiff neck.  You have changes in your: ? Vision. ? Hearing. ? Thinking. ? Mood. This information is not intended to replace advice given to you by your health care provider. Make sure you discuss any questions you have with your health care provider. Document Released: 09/10/2007 Document Revised: 11/25/2015 Document Reviewed: 06/29/2013 Elsevier Interactive Patient Education  2018 Elsevier   Inc.  

## 2017-12-25 NOTE — Progress Notes (Signed)
9/20/20193:33 PM  Janet Leblanc 01-24-1951, 67 y.o. female [id number]  Chief Complaint  Patient presents with  . Cough    flu like symptoms with some fever this morning, taking advil cough and cold for the symptoms    HPI:   Patient is a 67 y.o. female with past medical history significant for HTN who presents today for 5 days of cough and nasal congestion  Started with scratchy throat 5 days ago Sneezing and cough Lots of drainage advil cold and sinus and mucinex No chest congestion No itchy eyes, ears No wheezing, maybe a bit sob + sick contacts   Fall Risk  12/25/2017 03/19/2017 12/31/2016 12/22/2016 12/22/2016  Falls in the past year? No No No No No     Depression screen [facility] 2/9 12/25/2017 03/19/2017 12/31/2016  Decreased Interest 0 0 0  Down, Depressed, Hopeless 0 0 0  PHQ - 2 Score 0 0 0    Allergies  Allergen Reactions  . Penicillins Swelling    Prior to Admission medications   Medication Sig Start Date End Date Taking? Authorizing Provider  aspirin 81 MG tablet Take 81 mg by mouth daily.   Yes [provider]  clindamycin (CLEOCIN) 300 MG capsule Take 1 capsule (300 mg total) 3 (three) times daily by mouth. 02/22/17  Yes Mabe, Shanon Brow, NP  clonazePAM (KLONOPIN) 0.5 MG tablet Take 1 tablet (0.5 mg total) by mouth 2 (two) times daily as needed (vertigo). 06/03/14  Yes Shawnee Knapp, MD  hydrochlorothiazide (HYDRODIURIL) 25 MG tablet Take 1 tablet (25 mg total) by mouth daily. 03/19/17  Yes Forrest Moron, MD  HYDROcodone-acetaminophen (NORCO/VICODIN) 5-325 MG tablet Take 1 tablet every 4 (four) hours as needed by mouth. 02/22/17  Yes Mabe, Shanon Brow, NP  meloxicam (MOBIC) 15 MG tablet Take 1 tablet (15 mg total) by mouth daily. 03/25/16  Yes Jeffery, Domingo Mend, PA  Multiple Vitamin (MULTI VITAMIN DAILY PO) Take 1 tablet by mouth.   Yes [provider]    Past Medical History:  Diagnosis Date  . Hypertension   . Vertigo     Past Surgical History:    Procedure Laterality Date  . CHOLECYSTECTOMY    . FOOT SURGERY    . GASTRIC BYPASS     15 years ago at least  . TUBAL LIGATION      Social History   Tobacco Use  . Smoking status: Former Research scientist (life sciences)  . Smokeless tobacco: Never Used  Substance Use Topics  . Alcohol use: No    Alcohol/week: 0.0 standard drinks    Family History  Problem Relation Age of Onset  . Heart disease Father   . Hypertension Father   . Heart disease Brother   . Stroke Brother   . Heart disease Paternal Grandmother   . Cancer Paternal Grandfather   . Migraines Neg Hx     ROS Per hpi  OBJECTIVE:  Blood pressure (!) 152/82, pulse 71, temperature 97.7 F (36.5 C), temperature source Oral, height 5\' 5"  (1.651 m), weight 257 lb 12.8 oz (116.9 kg), SpO2 97 %. Body mass index is 42.9 kg/m.   Physical Exam  Constitutional: She is oriented to person, place, and time. She appears well-developed and well-nourished. She has a sickly appearance.  HENT:  Head: Normocephalic and atraumatic.  Right Ear: Hearing, tympanic membrane, external ear and ear canal normal.  Left Ear: Hearing, tympanic membrane, external ear and ear canal normal.  Mouth/Throat: Mucous membranes are normal. Posterior oropharyngeal erythema present. No  oropharyngeal exudate or posterior oropharyngeal edema.  Eyes: Pupils are equal, round, and reactive to light. Conjunctivae and EOM are normal.  Neck: Neck supple.  Cardiovascular: Normal rate, regular rhythm and normal heart sounds. Exam reveals no gallop and no friction rub.  No murmur heard. Pulmonary/Chest: Effort normal and breath sounds normal. She has no wheezes. She has no rales.  Musculoskeletal: She exhibits no edema.  Lymphadenopathy:    She has no cervical adenopathy.  Neurological: She is alert and oriented to person, place, and time.  Skin: Skin is warm and dry.  Psychiatric: She has a normal mood and affect.  Nursing note and vitals reviewed.   Results for orders placed  or performed in visit on 12/25/17 (from the past 24 hour(s))  POC Influenza A&B(BINAX/QUICKVUE)     Status: Normal   Collection Time: 12/25/17  3:30 PM  Result Value Ref Range   Influenza A, POC Negative Negative   Influenza B, POC Negative Negative    ASSESSMENT and PLAN  1. Acute upper respiratory infection Discussed supportive measures, new meds r/se/b and RTC precautions. Patient educational handout given. 2. Flu-like symptoms - POC Influenza A&B(BINAX/QUICKVUE)  Other orders - ipratropium (ATROVENT) 0.03 % nasal spray; Place 2 sprays into both nostrils 2 (two) times daily. - benzonatate (TESSALON) 100 MG capsule; Take 1-2 capsules (100-200 mg total) by mouth 3 (three) times daily as needed for cough.    Return if symptoms worsen or fail to improve.    Rutherford Guys, MD [facility] [place], [place] [address] Ph.  [phone] Fax [phone]

## 2018-01-01 ENCOUNTER — Ambulatory Visit: Payer: Medicare Other | Admitting: Podiatry

## 2018-01-01 ENCOUNTER — Encounter: Payer: Self-pay | Admitting: Podiatry

## 2018-01-01 DIAGNOSIS — M779 Enthesopathy, unspecified: Secondary | ICD-10-CM | POA: Diagnosis not present

## 2018-01-01 DIAGNOSIS — Q828 Other specified congenital malformations of skin: Secondary | ICD-10-CM

## 2018-01-01 MED ORDER — TRIAMCINOLONE ACETONIDE 10 MG/ML IJ SUSP
10.0000 mg | Freq: Once | INTRAMUSCULAR | Status: AC
  Administered 2018-01-01: 10 mg

## 2018-01-04 NOTE — Progress Notes (Signed)
Subjective:   Patient ID: Janet Leblanc, female   DOB: 67 y.o.   MRN: [id number]   HPI Patient presents with an inflammatory area plantar lateral aspect left fifth MPJ with fluid buildup around the joint that is painful when palpated.  Also presents with plantar keratotic lesion fifth MPJ that is painful and making it hard to wear shoe gear to walk without pain   ROS      Objective:  Physical Exam  Neurovascular status intact with inflammation fluid around the fifth MPJ left with keratotic lesion that is painful when palpated     Assessment:  Inflammatory capsulitis fifth MPJ left with keratotic lesion is painful     Plan:  Injected the fifth MPJ 3 mg dexamethasone Kenalog 5 mg Xylocaine and sharp deep debridement of lesion with no iatrogenic bleeding and reappoint for routine care with the possibility long-term for surgical intervention in this case

## 2018-01-21 ENCOUNTER — Other Ambulatory Visit: Payer: Medicare Other | Admitting: Orthotics

## 2018-01-25 ENCOUNTER — Ambulatory Visit (INDEPENDENT_AMBULATORY_CARE_PROVIDER_SITE_OTHER): Payer: Medicare Other | Admitting: Orthotics

## 2018-01-25 DIAGNOSIS — M779 Enthesopathy, unspecified: Secondary | ICD-10-CM

## 2018-01-25 DIAGNOSIS — M7750 Other enthesopathy of unspecified foot: Secondary | ICD-10-CM

## 2018-01-25 DIAGNOSIS — M2010 Hallux valgus (acquired), unspecified foot: Secondary | ICD-10-CM

## 2018-01-25 NOTE — Progress Notes (Signed)
After consideration of financial responsibility, patient decided to just get her old orthotics refurbished for $90

## 2018-02-09 ENCOUNTER — Telehealth: Payer: Self-pay | Admitting: Podiatry

## 2018-02-09 NOTE — Telephone Encounter (Signed)
Left message informing pt that ice therapy is good for pain and inflammation, ice 3-4 times a day for 15-20 minutes per sessions protecting the skin from the cold with a light cloth, and take the OTC pain reliever she tolerates for the discomfort and we would see her on 02/11/2018.

## 2018-02-09 NOTE — Telephone Encounter (Signed)
Patient called and is having pain in her right foot, has an appt. for 02/11/18 with Dr. Paulla Dolly. Patient wants to know if there is anything she can do in the meantime to help relieve the pain.   Patient ask to leave a voicemail because she is at work.

## 2018-02-11 ENCOUNTER — Encounter: Payer: Self-pay | Admitting: Podiatry

## 2018-02-11 ENCOUNTER — Ambulatory Visit (INDEPENDENT_AMBULATORY_CARE_PROVIDER_SITE_OTHER): Payer: Medicare Other

## 2018-02-11 ENCOUNTER — Ambulatory Visit (INDEPENDENT_AMBULATORY_CARE_PROVIDER_SITE_OTHER): Payer: Medicare Other | Admitting: Podiatry

## 2018-02-11 ENCOUNTER — Other Ambulatory Visit: Payer: Self-pay | Admitting: Podiatry

## 2018-02-11 DIAGNOSIS — M76811 Anterior tibial syndrome, right leg: Secondary | ICD-10-CM

## 2018-02-11 DIAGNOSIS — M79671 Pain in right foot: Secondary | ICD-10-CM

## 2018-02-11 MED ORDER — TRIAMCINOLONE ACETONIDE 10 MG/ML IJ SUSP
10.0000 mg | Freq: Once | INTRAMUSCULAR | Status: AC
  Administered 2018-02-11: 10 mg

## 2018-02-11 MED ORDER — DICLOFENAC SODIUM 75 MG PO TBEC: 75.0000 mg | DELAYED_RELEASE_TABLET | Freq: Two times a day (BID) | ORAL | 2 refills | Status: DC

## 2018-02-11 NOTE — Progress Notes (Signed)
Subjective:   Patient ID: Janet Leblanc, female   DOB: 67 y.o.   MRN: [id number]   HPI Patient presents stating she has developed a lot of pain in the right dorsal of the foot over the last few months and its been making it harder and harder for her to walk comfortably.  States left foot is doing well   ROS      Objective:  Physical Exam  Neurovascular status intact with patient's dorsal right around the anterior tibial tendon insertion found to be very inflamed and tender with no indications currently of muscle strength loss with patient having great difficulty in walking or utilizing her foot currently due to pain     Assessment:  Anterior tibial tendinitis right with inflammation fluid buildup near the insertion into the medial side of the foot     Plan:  Reviewed condition reviewed x-ray today careful sheath injection administered 3 mg Kenalog problem Xylocaine after explaining chances for rupture and sterile prep applied.  I then applied air fracture walker to completely immobilize advised on reduced activity aggressive ice therapy and reappoint to recheck  X-rays indicate no signs of stress fracture or arthritic condition associated with a symptoms the patient has

## 2018-03-12 ENCOUNTER — Encounter: Payer: Self-pay | Admitting: Podiatry

## 2018-03-12 ENCOUNTER — Ambulatory Visit (INDEPENDENT_AMBULATORY_CARE_PROVIDER_SITE_OTHER): Payer: Medicare Other

## 2018-03-12 ENCOUNTER — Ambulatory Visit: Payer: Medicare Other | Admitting: Podiatry

## 2018-03-12 DIAGNOSIS — M76811 Anterior tibial syndrome, right leg: Secondary | ICD-10-CM | POA: Diagnosis not present

## 2018-03-12 DIAGNOSIS — M21612 Bunion of left foot: Secondary | ICD-10-CM

## 2018-03-12 DIAGNOSIS — L84 Corns and callosities: Secondary | ICD-10-CM

## 2018-03-12 DIAGNOSIS — M21619 Bunion of unspecified foot: Secondary | ICD-10-CM

## 2018-03-14 NOTE — Progress Notes (Signed)
Subjective:   Patient ID: Janet Leblanc, female   DOB: 67 y.o.   MRN: [id number]   HPI Patient states the boot seem to help and still wearing it some but I have reduced it over the last few days.  Also have these lesions bottom of both feet that are very tender make it hard to walk I know of Structural bunion deformity   ROS      Objective:  Physical Exam  Neurovascular status intact with diminishment of tendinitis Achilles tendinitis with inflammation still present upon deep palpation but improved with patient found to have plantar keratotic lesions bilateral     Assessment:  Chronic lesion formation with tenderness symptoms and chronic Achilles tendinitis     Plan:  Reviewed both conditions advised on boot usage to immobilize him to gradually reduce it over the next 4 weeks and did sharp sterile debridement of lesions bilateral with no iatrogenic bleeding and reappoint to recheck

## 2018-04-29 ENCOUNTER — Other Ambulatory Visit: Payer: Self-pay | Admitting: Family Medicine

## 2018-04-29 DIAGNOSIS — I10 Essential (primary) hypertension: Secondary | ICD-10-CM

## 2018-04-30 NOTE — Telephone Encounter (Signed)
Requested Prescriptions  Pending Prescriptions Disp Refills  . hydrochlorothiazide (HYDRODIURIL) 25 MG tablet [Pharmacy Med Name: HYDROCHLOROTHIAZIDE  25MG   TAB] 90 tablet 1    Sig: TAKE 1 TABLET BY MOUTH  DAILY     Cardiovascular: Diuretics - Thiazide Failed - 04/29/2018  9:06 PM      Failed - Ca in normal range and within 360 days    Calcium  Date Value Ref Range Status  03/19/2017 9.0 8.7 - 10.3 mg/dL Final         Failed - Cr in normal range and within 360 days    Creat  Date Value Ref Range Status  01/15/2015 1.08 (H) 0.50 - 0.99 mg/dL Final   Creatinine, Ser  Date Value Ref Range Status  03/19/2017 1.02 (H) 0.57 - 1.00 mg/dL Final         Failed - K in normal range and within 360 days    Potassium  Date Value Ref Range Status  03/19/2017 4.0 3.5 - 5.2 mmol/L Final         Failed - Na in normal range and within 360 days    Sodium  Date Value Ref Range Status  03/19/2017 142 134 - 144 mmol/L Final         Failed - Last BP in normal range    BP Readings from Last 1 Encounters:  12/25/17 (!) 152/82         Failed - Valid encounter within last 6 months    Recent Outpatient Visits          4 months ago Acute upper respiratory infection   [facility], Lilia Argue, MD   1 year ago Benign essential HTN   Primary Care at [facility], Arlie Solomons, MD   1 year ago Flexor hallucis longus tendonitis   Primary [facility] at [facility], Arlie Solomons, MD   1 year ago Dysuria   Primary Care at [facility], [place] A, MD   2 years ago Acute left ankle pain   Primary Care at [facility], [place], [place]      Future Appointments            In 2 months Forrest Moron, MD Primary Care at [place], [facility]         \filled until next appointment

## 2018-06-03 ENCOUNTER — Encounter: Payer: Medicare Other | Admitting: Family Medicine

## 2018-07-13 ENCOUNTER — Other Ambulatory Visit: Payer: Self-pay

## 2018-07-13 DIAGNOSIS — Z1329 Encounter for screening for other suspected endocrine disorder: Secondary | ICD-10-CM

## 2018-07-13 DIAGNOSIS — Z13228 Encounter for screening for other metabolic disorders: Secondary | ICD-10-CM

## 2018-07-13 DIAGNOSIS — Z13220 Encounter for screening for lipoid disorders: Secondary | ICD-10-CM

## 2018-07-13 DIAGNOSIS — Z1389 Encounter for screening for other disorder: Secondary | ICD-10-CM

## 2018-07-13 DIAGNOSIS — Z130 Encounter for screening for diseases of the blood and blood-forming organs and certain disorders involving the immune mechanism: Secondary | ICD-10-CM

## 2018-07-20 ENCOUNTER — Other Ambulatory Visit: Payer: Self-pay

## 2018-07-20 ENCOUNTER — Telehealth: Payer: Self-pay

## 2018-07-20 ENCOUNTER — Ambulatory Visit (INDEPENDENT_AMBULATORY_CARE_PROVIDER_SITE_OTHER): Payer: Medicare Other | Admitting: Family Medicine

## 2018-07-20 ENCOUNTER — Encounter: Payer: Medicare Other | Admitting: Family Medicine

## 2018-07-20 DIAGNOSIS — Z1329 Encounter for screening for other suspected endocrine disorder: Secondary | ICD-10-CM

## 2018-07-20 DIAGNOSIS — Z130 Encounter for screening for diseases of the blood and blood-forming organs and certain disorders involving the immune mechanism: Secondary | ICD-10-CM

## 2018-07-20 DIAGNOSIS — Z1389 Encounter for screening for other disorder: Secondary | ICD-10-CM | POA: Diagnosis not present

## 2018-07-20 DIAGNOSIS — Z13228 Encounter for screening for other metabolic disorders: Secondary | ICD-10-CM | POA: Diagnosis not present

## 2018-07-20 DIAGNOSIS — I10 Essential (primary) hypertension: Secondary | ICD-10-CM

## 2018-07-20 DIAGNOSIS — H814 Vertigo of central origin: Secondary | ICD-10-CM | POA: Diagnosis not present

## 2018-07-20 DIAGNOSIS — Z13220 Encounter for screening for lipoid disorders: Secondary | ICD-10-CM | POA: Diagnosis not present

## 2018-07-20 LAB — POC MICROSCOPIC URINALYSIS (UMFC): Mucus: ABSENT

## 2018-07-20 LAB — POCT URINALYSIS DIP (MANUAL ENTRY)
Glucose, UA: NEGATIVE mg/dL
Nitrite, UA: NEGATIVE
Protein Ur, POC: 30 mg/dL — AB
Spec Grav, UA: >=1.03 — AB (ref 1.010–1.025)
Urobilinogen, UA: 0.2 E.U./dL
pH, UA: 5.5 (ref 5.0–8.0)

## 2018-07-21 LAB — COMPREHENSIVE METABOLIC PANEL
ALT: 18 IU/L (ref 0–32)
AST: 20 IU/L (ref 0–40)
Albumin/Globulin Ratio: 1.6 (ref 1.2–2.2)
Albumin: 4 g/dL (ref 3.8–4.8)
Alkaline Phosphatase: 101 IU/L (ref 39–117)
BUN/Creatinine Ratio: 10 — ABNORMAL LOW (ref 12–28)
BUN: 12 mg/dL (ref 8–27)
Bilirubin Total: 0.3 mg/dL (ref 0.0–1.2)
CO2: 22 mmol/L (ref 20–29)
Calcium: 9.2 mg/dL (ref 8.7–10.3)
Chloride: 101 mmol/L (ref 96–106)
Creatinine, Ser: 1.17 mg/dL — ABNORMAL HIGH (ref 0.57–1.00)
GFR calc Af Amer: 56 mL/min/{1.73_m2} — ABNORMAL LOW (ref >59)
GFR calc non Af Amer: 49 mL/min/{1.73_m2} — ABNORMAL LOW (ref >59)
Globulin, Total: 2.5 g/dL (ref 1.5–4.5)
Glucose: 134 mg/dL — ABNORMAL HIGH (ref 65–99)
Potassium: 3.8 mmol/L (ref 3.5–5.2)
Sodium: 140 mmol/L (ref 134–144)
Total Protein: 6.5 g/dL (ref 6.0–8.5)

## 2018-07-21 LAB — LIPID PANEL
Chol/HDL Ratio: 5 ratio — ABNORMAL HIGH (ref 0.0–4.4)
Cholesterol, Total: 207 mg/dL — ABNORMAL HIGH (ref 100–199)
HDL: 41 mg/dL (ref >39)
LDL Calculated: 136 mg/dL — ABNORMAL HIGH (ref 0–99)
Triglycerides: 148 mg/dL (ref 0–149)
VLDL Cholesterol Cal: 30 mg/dL (ref 5–40)

## 2018-07-21 LAB — CBC WITH DIFFERENTIAL/PLATELET
Basophils Absolute: 0.1 10*3/uL (ref 0.0–0.2)
Basos: 1 %
EOS (ABSOLUTE): 0.2 10*3/uL (ref 0.0–0.4)
Eos: 3 %
Hematocrit: 41.7 % (ref 34.0–46.6)
Hemoglobin: 14.3 g/dL (ref 11.1–15.9)
Immature Grans (Abs): 0 10*3/uL (ref 0.0–0.1)
Immature Granulocytes: 0 %
Lymphocytes Absolute: 2.5 10*3/uL (ref 0.7–3.1)
Lymphs: 35 %
MCH: 30 pg (ref 26.6–33.0)
MCHC: 34.3 g/dL (ref 31.5–35.7)
MCV: 87 fL (ref 79–97)
Monocytes Absolute: 0.6 10*3/uL (ref 0.1–0.9)
Monocytes: 9 %
Neutrophils Absolute: 3.8 10*3/uL (ref 1.4–7.0)
Neutrophils: 52 %
Platelets: 339 10*3/uL (ref 150–450)
RBC: 4.77 x10E6/uL (ref 3.77–5.28)
RDW: 12.4 % (ref 11.7–15.4)
WBC: 7.2 10*3/uL (ref 3.4–10.8)

## 2018-07-21 LAB — TSH: TSH: 4.87 u[IU]/mL — ABNORMAL HIGH (ref 0.450–4.500)

## 2018-07-22 ENCOUNTER — Telehealth: Payer: Self-pay | Admitting: Family Medicine

## 2018-07-22 ENCOUNTER — Other Ambulatory Visit: Payer: Self-pay | Admitting: Family Medicine

## 2018-07-22 MED ORDER — CIPROFLOXACIN HCL 250 MG PO TABS: 250.0000 mg | ORAL_TABLET | Freq: Two times a day (BID) | ORAL | 0 refills | Status: DC

## 2018-07-22 NOTE — Telephone Encounter (Signed)
Rx filled on 04/30/18 hctz 25 mg #90 with 1 refill. Dgaddy, CMA

## 2018-07-22 NOTE — Telephone Encounter (Signed)
Copied from [place] [phone]. Topic: Quick Communication - Rx Refill/Question >> Jul 22, 2018  4:32 PM Rayann Heman wrote: Medication: hydrochlorothiazide (HYDRODIURIL) 25 MG tablet [[id number]]   Has the patient contacted their pharmacy? no  Preferred Pharmacy (with phone number or street name): [place], [place] [phone] (Phone) [phone] (Fax)    Agent: Please be advised that RX refills may take up to 3 business days. We ask that you follow-up with your pharmacy.

## 2018-07-22 NOTE — Telephone Encounter (Signed)
This patient needs to be seen. Schedule with any available provider except Dr Carlota Raspberry or Dr Holly Bodily. thanks

## 2018-07-22 NOTE — Telephone Encounter (Signed)
Please advise on refill?   Pt was last seen 12/2017 for URI and 2018 from [place] med issues

## 2018-09-01 NOTE — Progress Notes (Signed)
This encounter was created in error - please disregard.

## 2018-09-06 ENCOUNTER — Other Ambulatory Visit: Payer: Self-pay

## 2018-09-06 ENCOUNTER — Encounter: Payer: Self-pay | Admitting: Podiatry

## 2018-09-06 ENCOUNTER — Ambulatory Visit (INDEPENDENT_AMBULATORY_CARE_PROVIDER_SITE_OTHER): Payer: Medicare Other

## 2018-09-06 ENCOUNTER — Ambulatory Visit: Payer: Medicare Other | Admitting: Podiatry

## 2018-09-06 VITALS — Temp 97.7°F

## 2018-09-06 DIAGNOSIS — M779 Enthesopathy, unspecified: Secondary | ICD-10-CM

## 2018-09-06 DIAGNOSIS — Q828 Other specified congenital malformations of skin: Secondary | ICD-10-CM | POA: Diagnosis not present

## 2018-09-06 DIAGNOSIS — M2041 Other hammer toe(s) (acquired), right foot: Secondary | ICD-10-CM | POA: Diagnosis not present

## 2018-09-06 MED ORDER — TRIAMCINOLONE ACETONIDE 10 MG/ML IJ SUSP: 10.0000 mg | Freq: Once | INTRAMUSCULAR | Status: DC

## 2018-09-06 NOTE — Progress Notes (Signed)
Subjective:   Patient ID: Janet Leblanc, female   DOB: 68 y.o.   MRN: [id number]   HPI Patient presents stating that she has pain for spots on the side of both feet that are very hard for her to wear shoe gear with and they are inflamed and she has lesions that have also become very tender.  Also concerned about digital deformity right stating that it seems like it is becoming more rigid   ROS      Objective:  Physical Exam  Inflammatory capsulitis fifth MPJ bilateral with pain along with keratotic lesion formation with several lesions on both feet with lucent cores and elevated second and third toes right which appear to becoming more rigid     Assessment:  Capsulitis fifth MPJ bilateral with fluid buildup noted and porokeratotic type lesions bilateral along with hammertoe deformity     Plan:  H&P discussed condition and reviewed x-rays.  At this point I did sterile prep and carefully injected the fifth MPJ of both feet 3 mg dexamethasone Kenalog 5 mg Xylocaine and then debrided lesions on both the third digit right and discussed hammertoe deformity of the right and do not recommend surgery currently but may be necessary at one point  X-rays indicate mild elevation of the lesser digits with medial deviation digit to right

## 2018-10-31 ENCOUNTER — Ambulatory Visit (HOSPITAL_COMMUNITY)
Admission: EM | Admit: 2018-10-31 | Discharge: 2018-10-31 | Disposition: A | Discharge status: Home / Self Care | Payer: Medicare Other | Attending: Family Medicine | Admitting: Family Medicine

## 2018-10-31 ENCOUNTER — Other Ambulatory Visit: Payer: Self-pay

## 2018-10-31 DIAGNOSIS — B9689 Other specified bacterial agents as the cause of diseases classified elsewhere: Secondary | ICD-10-CM | POA: Diagnosis not present

## 2018-10-31 DIAGNOSIS — N390 Urinary tract infection, site not specified: Secondary | ICD-10-CM | POA: Insufficient documentation

## 2018-10-31 DIAGNOSIS — N12 Tubulo-interstitial nephritis, not specified as acute or chronic: Secondary | ICD-10-CM | POA: Insufficient documentation

## 2018-10-31 DIAGNOSIS — I10 Essential (primary) hypertension: Secondary | ICD-10-CM

## 2018-10-31 LAB — POCT URINALYSIS DIP (DEVICE)
Bilirubin Urine: NEGATIVE
Glucose, UA: NEGATIVE mg/dL
Hgb urine dipstick: NEGATIVE
Ketones, ur: NEGATIVE mg/dL
Leukocytes,Ua: NEGATIVE
Nitrite: POSITIVE — AB
Protein, ur: NEGATIVE mg/dL
Specific Gravity, Urine: 1.01 (ref 1.005–1.030)
Urobilinogen, UA: 0.2 mg/dL (ref 0.0–1.0)
pH: 5 (ref 5.0–8.0)

## 2018-10-31 MED ORDER — CIPROFLOXACIN HCL 500 MG PO TABS: 500.0000 mg | ORAL_TABLET | Freq: Two times a day (BID) | ORAL | 0 refills | Status: DC

## 2018-10-31 NOTE — ED Notes (Signed)
No response when called from waiting area.

## 2018-10-31 NOTE — ED Triage Notes (Signed)
Per pt she has been having lower left back and has hx of uti. SAYS feels like she is getting one now. No blood in urine and not painful. Said not being able to really use the bathroom.

## 2018-10-31 NOTE — ED Provider Notes (Signed)
[facility]    CSN: [id number] Arrival date & time: 10/31/18  1336      History   Chief Complaint Chief Complaint  Patient presents with  . Urinary Tract Infection  . Back Pain    HPI Janet Leblanc is a 68 y.o. female.   HPI  This is a 68 y.o. female who presents today with dysuria. Symptoms have been intermittent for more than 1 week. She reports burning with urination. No hematuria. Lower abdominal and back pain present. She is pushing fluids consistently without relief of pain symptoms. Has not attempted relief with over-the-counter medication. Denies fever, nausea, or vomiting. Past Medical History:  Diagnosis Date  . Hypertension   . Vertigo     Patient Active Problem List   Diagnosis Date Noted  . Benign essential HTN 03/24/2016  . 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 Tinnitus of both ears 06/08/2014    Past Surgical History:  Procedure Laterality Date  . CHOLECYSTECTOMY    . FOOT SURGERY    . GASTRIC BYPASS     15 years ago at least  . TUBAL LIGATION      OB History   No obstetric history on file.      Home Medications    Prior to Admission medications   Medication Sig Start Date End Date Taking? Authorizing Provider  aspirin 81 MG tablet Take 81 mg by mouth daily.    [provider]  benzonatate (TESSALON) 100 MG capsule Take 1-2 capsules (100-200 mg total) by mouth 3 (three) times daily as needed for cough. 12/25/17   Rutherford Guys, MD  clonazePAM (KLONOPIN) 0.5 MG tablet Take 1 tablet (0.5 mg total) by mouth 2 (two) times daily as needed (vertigo). 06/03/14   Shawnee Knapp, MD  diclofenac (VOLTAREN) 75 MG EC tablet Take 1 tablet (75 mg total) by mouth 2 (two) times daily. Patient taking differently: Take 75 mg by mouth as needed.  02/11/18   Wallene Huh, DPM  hydrochlorothiazide (HYDRODIURIL) 25 MG tablet TAKE 1 TABLET BY MOUTH  DAILY 04/30/18   Forrest Moron, MD  HYDROcodone-acetaminophen  (NORCO/VICODIN) 5-325 MG tablet Take 1 tablet every 4 (four) hours as needed by mouth. 02/22/17   Janne Napoleon, NP  ipratropium (ATROVENT) 0.03 % nasal spray Place 2 sprays into both nostrils 2 (two) times daily. 12/25/17   Rutherford Guys, MD  meloxicam (MOBIC) 15 MG tablet Take 1 tablet (15 mg total) by mouth daily. Patient taking differently: Take 15 mg by mouth as needed.  03/25/16   Harrison Mons, PA  Multiple Vitamin (MULTI VITAMIN DAILY PO) Take 1 tablet by mouth.    [provider]  POTASSIUM PO Take 1 tablet by mouth as needed.    [provider]    Family History Family History  Problem Relation Age of Onset  . Heart disease Father   . Hypertension Father   . Heart disease Brother   . Stroke Brother   . Heart disease Paternal Grandmother   . Cancer Paternal Grandfather   . Migraines Neg Hx     Social History Social History   Tobacco Use  . Smoking status: Former Research scientist (life sciences)  . Smokeless tobacco: Never Used  Substance Use Topics  . Alcohol use: No    Alcohol/week: 0.0 standard drinks  . Drug use: No     Allergies   Penicillins   Review of Systems Review of Systems Pertinent negatives listed in HPI  Physical Exam Triage Vital Signs ED Triage Vitals  Enc Vitals Group     BP 10/31/18 1430 (!) 157/84     Pulse Rate 10/31/18 1430 73     Resp --      Temp 10/31/18 1430 97.6 F (36.4 C)     Temp Source 10/31/18 1430 Temporal     SpO2 10/31/18 1430 99 %     Weight --      Height --      Head Circumference --      Peak Flow --      Pain Score 10/31/18 1431 8     Pain Loc --      Pain Edu? --      Excl. in [place]? --    No data found.  Updated Vital Signs BP (!) 157/84   Pulse 73   Temp 97.6 F (36.4 C) (Temporal)   SpO2 99%   Visual Acuity Right Eye Distance:   Left Eye Distance:   Bilateral Distance:    Right Eye Near:   Left Eye Near:    Bilateral Near:     Physical Exam General appearance: alert, well developed, well  nourished, cooperative and in no distress Head: Normocephalic, without obvious abnormality, atraumatic Respiratory: Respirations even and unlabored, normal respiratory rate Heart: rate and rhythm normal. No gallop or murmurs noted on exam Abdomen: positive CVA tenderness Extremities: No gross deformities Skin: Skin color, texture, turgor normal. No rashes seen  Psych: Appropriate mood and affect. Neurologic: Mental status: Alert, oriented to person, place, and time, thought content appropriate.  UC Treatments / Results  Labs (all labs ordered are listed, but only abnormal results are displayed) Labs Reviewed  POCT URINALYSIS DIP (DEVICE) - Abnormal; Notable for the following components:      Result Value   Nitrite POSITIVE (*)    All other components within normal limits  URINE CULTURE    EKG   Radiology No results found.  Procedures Procedures (including critical care time)  Medications Ordered in UC Medications - No data to display  Initial Impression / Assessment and Plan / UC Course  I have reviewed the triage vital signs and the nursing notes.  Pertinent labs & imaging results that were available during my care of the patient were reviewed by me and considered in my medical decision making (see chart for details).   Acute pyelonephritis ,UA significant for nitrates and positive CVA tenderness. Start Ciprofloxacin 500 mg BID x  7 days. Red flags discussed. Patient verbalized understanding and agreement of plan. Final Clinical Impressions(s) / UC Diagnoses   Final diagnoses:  Lower urinary tract infectious disease  Pyelonephritis     Discharge Instructions     Start ciprofloxacin 500 mg twice daily x 7 days. You will be notified via phone if urine culture is resistant to current prescribed antibiotic.  Take two Aleve every 12 hours as needed for back pain.     ED Prescriptions    Medication Sig Dispense Auth. Provider   ciprofloxacin (CIPRO) 500 MG tablet  Take 1 tablet (500 mg total) by mouth 2 (two) times daily. 14 tablet Scot Jun, FNP     Controlled Substance Prescriptions [place] Controlled Substance Registry consulted? Not Applicable   Scot Jun, FNP 11/01/18 1418

## 2018-10-31 NOTE — Discharge Instructions (Signed)
Start ciprofloxacin 500 mg twice daily x 7 days. You will be notified via phone if urine culture is resistant to current prescribed antibiotic.  Take two Aleve every 12 hours as needed for back pain.

## 2018-11-01 LAB — URINE CULTURE
Culture: <10000 — AB
Special Requests: NORMAL

## 2018-11-29 ENCOUNTER — Telehealth: Payer: Self-pay | Admitting: *Deleted

## 2018-11-29 NOTE — Telephone Encounter (Signed)
Schedule awv  

## 2018-12-06 ENCOUNTER — Ambulatory Visit: Payer: Medicare Other | Admitting: Podiatry

## 2018-12-06 ENCOUNTER — Other Ambulatory Visit: Payer: Self-pay

## 2018-12-06 ENCOUNTER — Encounter: Payer: Self-pay | Admitting: Podiatry

## 2018-12-06 DIAGNOSIS — M21619 Bunion of unspecified foot: Secondary | ICD-10-CM

## 2018-12-06 DIAGNOSIS — Q828 Other specified congenital malformations of skin: Secondary | ICD-10-CM

## 2018-12-06 NOTE — Progress Notes (Signed)
Subjective:   Patient ID: Janet Leblanc, female   DOB: 68 y.o.   MRN: [id number]   HPI Patient states I seem to have a clogged gland on my left foot and also I know that the bone is a problem and may require surgery like the right one   ROS      Objective:  Physical Exam  Neurovascular status intact with patient's left fifth metatarsal found to be moderately hypertrophied with lesion underneath the fifth metatarsal fourth metatarsal with a lucent type core     Assessment:  Porokeratotic lesion around the fifth metatarsal with inflammatory enlargement of the head of the metatarsal noted     Plan:  H&P discussed condition and at this point discussed fifth metatarsal head resection may be necessary but I did go ahead today and I debrided the lesions

## 2018-12-10 ENCOUNTER — Other Ambulatory Visit: Payer: Self-pay | Admitting: Family Medicine

## 2018-12-10 DIAGNOSIS — I10 Essential (primary) hypertension: Secondary | ICD-10-CM

## 2018-12-10 NOTE — Telephone Encounter (Signed)
Forwarding medication refill request to clinical pool for review. 

## 2018-12-27 DIAGNOSIS — H35033 Hypertensive retinopathy, bilateral: Secondary | ICD-10-CM | POA: Diagnosis not present

## 2019-02-01 DIAGNOSIS — H35373 Puckering of macula, bilateral: Secondary | ICD-10-CM | POA: Diagnosis not present

## 2019-03-10 ENCOUNTER — Other Ambulatory Visit: Payer: Self-pay | Admitting: General Practice

## 2019-03-10 DIAGNOSIS — I10 Essential (primary) hypertension: Secondary | ICD-10-CM

## 2019-03-10 NOTE — Telephone Encounter (Signed)
Requested medication (s) are due for refill today: yes  Requested medication (s) are on the active medication list: yes  Last refill:  12/10/2018  Future visit scheduled: no  Notes to clinic:  Review for refill Overdue for office visit   Requested Prescriptions  Pending Prescriptions Disp Refills   hydrochlorothiazide (HYDRODIURIL) 25 MG tablet 30 tablet 0    Sig: Take 1 tablet (25 mg total) by mouth daily. Needs appt for Refills     Cardiovascular: Diuretics - Thiazide Failed - 03/10/2019 10:42 AM      Failed - Cr in normal range and within 360 days    Creat  Date Value Ref Range Status  01/15/2015 1.08 (H) 0.50 - 0.99 mg/dL Final   Creatinine, Ser  Date Value Ref Range Status  07/20/2018 1.17 (H) 0.57 - 1.00 mg/dL Final         Failed - Last BP in normal range    BP Readings from Last 1 Encounters:  10/31/18 (!) 157/84         Failed - Valid encounter within last 6 months    Recent Outpatient Visits          7 months ago Screening for hematuria or proteinuria   Primary Care at [facility], Arlie Solomons, MD   1 year ago Acute upper respiratory infection   [facility] at [facility], Lilia Argue, MD   1 year ago Benign essential HTN   Primary Care at [facility], [place] A, MD   2 years ago Flexor hallucis longus tendonitis   Primary Care at [facility] [address], Arlie Solomons, MD   2 years ago Dysuria   Primary Care at [facility], Arlie Solomons, MD             [place] in normal range and within 360 days    Calcium  Date Value Ref Range Status  07/20/2018 9.2 8.7 - 10.3 mg/dL Final         Passed - K in normal range and within 360 days    Potassium  Date Value Ref Range Status  07/20/2018 3.8 3.5 - 5.2 mmol/L Final         Passed - Na in normal range and within 360 days    Sodium  Date Value Ref Range Status  07/20/2018 140 134 - 144 mmol/L Final

## 2019-03-10 NOTE — Telephone Encounter (Signed)
Medication refill: hydrochlorothiazide (HYDRODIURIL) 25 MG tablet [id number] Would like 90 day supply     Pharmacy:  [place], [place] [phone] (Phone) [phone] (Fax)   Pt aware turn around time

## 2019-03-16 MED ORDER — HYDROCHLOROTHIAZIDE 25 MG PO TABS: 25.0000 mg | ORAL_TABLET | Freq: Every day | ORAL | 0 refills | Status: DC

## 2019-03-16 NOTE — Telephone Encounter (Signed)
One month supply sent to [facility].   Please schedule pt for f/u appt prior to additional refills.

## 2019-03-23 ENCOUNTER — Other Ambulatory Visit: Payer: Self-pay | Admitting: Family Medicine

## 2019-03-23 DIAGNOSIS — I10 Essential (primary) hypertension: Secondary | ICD-10-CM

## 2019-03-23 NOTE — Telephone Encounter (Signed)
Forwarding medication refill request to the clinical pool for review. 

## 2019-03-25 ENCOUNTER — Telehealth: Payer: Self-pay | Admitting: Family Medicine

## 2019-03-25 NOTE — Telephone Encounter (Signed)
Pt already scheduled to come in office

## 2019-03-25 NOTE — Telephone Encounter (Signed)
Pt will run out of her hydrochlorothiazide (HYDRODIURIL) 25 MG tablet [id number] mid January. She would like one more refill of this med. She has an upcoming appointment for a cpe with Dr. Nolon Rod. [place], [place] [facility]  [address], [place] [address]  Phone:  [phone] Fax:  [phone]. Please advise at [phone].

## 2019-03-30 ENCOUNTER — Other Ambulatory Visit: Payer: Self-pay | Admitting: *Deleted

## 2019-03-30 DIAGNOSIS — I10 Essential (primary) hypertension: Secondary | ICD-10-CM

## 2019-03-30 MED ORDER — HYDROCHLOROTHIAZIDE 25 MG PO TABS: 25.0000 mg | ORAL_TABLET | Freq: Every day | ORAL | 0 refills | Status: DC

## 2019-03-30 NOTE — Telephone Encounter (Signed)
Prescription sent  Must keep appointment in 05-2018

## 2019-05-10 ENCOUNTER — Telehealth: Payer: Self-pay | Admitting: *Deleted

## 2019-05-10 ENCOUNTER — Ambulatory Visit (INDEPENDENT_AMBULATORY_CARE_PROVIDER_SITE_OTHER): Payer: Medicare Other | Admitting: Family Medicine

## 2019-05-10 VITALS — BP 157/84 | Ht 67.0 in | Wt 257.0 lb

## 2019-05-10 DIAGNOSIS — Z0000 Encounter for general adult medical examination without abnormal findings: Secondary | ICD-10-CM | POA: Diagnosis not present

## 2019-05-10 NOTE — Patient Instructions (Addendum)
Thank you for taking time to come for your Medicare Wellness Visit. I appreciate your ongoing commitment to your health goals. Please review the following plan we discussed and let me know if I can assist you in the future.  Quindell Shere LPN  Preventive Care 65 Years and Older, Female Preventive care refers to lifestyle choices and visits with your health care provider that can promote health and wellness. This includes:  A yearly physical exam. This is also called an annual well check.  Regular dental and eye exams.  Immunizations.  Screening for certain conditions.  Healthy lifestyle choices, such as diet and exercise. What can I expect for my preventive care visit? Physical exam Your health care provider will check:  Height and weight. These may be used to calculate body mass index (BMI), which is a measurement that tells if you are at a healthy weight.  Heart rate and blood pressure.  Your skin for abnormal spots. Counseling Your health care provider may ask you questions about:  Alcohol, tobacco, and drug use.  Emotional well-being.  Home and relationship well-being.  Sexual activity.  Eating habits.  History of falls.  Memory and ability to understand (cognition).  Work and work environment.  Pregnancy and menstrual history. What immunizations do I need?  Influenza (flu) vaccine  This is recommended every year. Tetanus, diphtheria, and pertussis (Tdap) vaccine  You may need a Td booster every 10 years. Varicella (chickenpox) vaccine  You may need this vaccine if you have not already been vaccinated. Zoster (shingles) vaccine  You may need this after age 60. Pneumococcal conjugate (PCV13) vaccine  One dose is recommended after age 65. Pneumococcal polysaccharide (PPSV23) vaccine  One dose is recommended after age 65. Measles, mumps, and rubella (MMR) vaccine  You may need at least one dose of MMR if you were born in 1957 or later. You may also  need a second dose. Meningococcal conjugate (MenACWY) vaccine  You may need this if you have certain conditions. Hepatitis A vaccine  You may need this if you have certain conditions or if you travel or work in places where you may be exposed to hepatitis A. Hepatitis B vaccine  You may need this if you have certain conditions or if you travel or work in places where you may be exposed to hepatitis B. Haemophilus influenzae type b (Hib) vaccine  You may need this if you have certain conditions. You may receive vaccines as individual doses or as more than one vaccine together in one shot (combination vaccines). Talk with your health care provider about the risks and benefits of combination vaccines. What tests do I need? Blood tests  Lipid and cholesterol levels. These may be checked every 5 years, or more frequently depending on your overall health.  Hepatitis C test.  Hepatitis B test. Screening  Lung cancer screening. You may have this screening every year starting at age 55 if you have a 30-pack-year history of smoking and currently smoke or have quit within the past 15 years.  Colorectal cancer screening. All adults should have this screening starting at age 50 and continuing until age 75. Your health care provider may recommend screening at age 45 if you are at increased risk. You will have tests every 1-10 years, depending on your results and the type of screening test.  Diabetes screening. This is done by checking your blood sugar (glucose) after you have not eaten for a while (fasting). You may have this done every 1-3   years.  Mammogram. This may be done every 1-2 years. Talk with your health care provider about how often you should have regular mammograms.  BRCA-related cancer screening. This may be done if you have a family history of breast, ovarian, tubal, or peritoneal cancers. Other tests  Sexually transmitted disease (STD) testing.  Bone density scan. This is done  to screen for osteoporosis. You may have this done starting at age 94. Follow these instructions at home: Eating and drinking  Eat a diet that includes fresh fruits and vegetables, whole grains, lean protein, and low-fat dairy products. Limit your intake of foods with high amounts of sugar, saturated fats, and salt.  Take vitamin and mineral supplements as recommended by your health care provider.  Do not drink alcohol if your health care provider tells you not to drink.  If you drink alcohol: ? Limit how much you have to 0-1 drink a day. ? Be aware of how much alcohol is in your drink. In the [place] one drink equals one 12 oz bottle of beer (355 mL), one 5 oz glass of wine (148 mL), or one 1 oz glass of hard liquor (44 mL). Lifestyle  Take daily care of your teeth and gums.  Stay active. Exercise for at least 30 minutes on 5 or more days each week.  Do not use any products that contain nicotine or tobacco, such as cigarettes, e-cigarettes, and chewing tobacco. If you need help quitting, ask your health care provider.  If you are sexually active, practice safe sex. Use a condom or other form of protection in order to prevent STIs (sexually transmitted infections).  Talk with your health care provider about taking a low-dose aspirin or statin. What's next?  Go to your health care provider once a year for a well check visit.  Ask your health care provider how often you should have your eyes and teeth checked.  Stay up to date on all vaccines. This information is not intended to replace advice given to you by your health care provider. Make sure you discuss any questions you have with your health care provider. Document Revised: 03/18/2018 Document Reviewed: 03/18/2018 Elsevier Patient Education  2020 [employer].

## 2019-05-10 NOTE — Progress Notes (Signed)
Presents today for [employer] Visit   Date of last exam: 07/20/2018  Interpreter used for this visit? No  I connected with  Janet Leblanc on 05/10/19 by a telephone and verified that I am speaking with the correct person using two identifiers.   I discussed the limitations of evaluation and management by telemedicine. The patient expressed understanding and agreed to proceed.   Patient Care Team: Patient, No Pcp Per as PCP - General (General Practice) Jodi Marble, MD as Consulting Physician (Otolaryngology)   Other items to address today:   Discussed immunizations Discussed Eye/Dental Follow up scheduled 2/10 @ 1:20 with Dr. Nolon Rod Will Discuss Shingles/Pneum vaccination.   Has never had Colonoscopy will discuss cologuard at visit    Other Screening:  Last lipid screening:05/20/2016  ADVANCE DIRECTIVES: Discussed: yes On File:no Materials Provided: no  Immunization status:  Immunization History  Administered Date(s) Administered  . Influenza, Quadrivalent, Recombinant, Inj, Pf 01/20/2018  . Influenza,inj,Quad PF,6+ Mos 02/05/2017  . Influenza-Unspecified 02/23/2016     Health Maintenance Due  Topic Date Due  . TETANUS/TDAP  08/16/1970  . MAMMOGRAM  08/15/2001  . COLONOSCOPY  08/15/2001  . DEXA SCAN  08/15/2016  . PNA vac Low Risk Adult (1 of 2 - PCV13) 08/15/2016  . INFLUENZA VACCINE  11/06/2018     Functional Status Survey: Is the patient deaf or have difficulty hearing?: No Does the patient have difficulty seeing, even when wearing glasses/contacts?: No Does the patient have difficulty concentrating, remembering, or making decisions?: No Does the patient have difficulty walking or climbing stairs?: No Does the patient have difficulty dressing or bathing?: No Does the patient have difficulty doing errands alone such as visiting a doctor's office or shopping?: No   6CIT Screen 05/10/2019  What Year? 0 points  What month? 0  points  What time? 0 points  Count back from 20 0 points  Months in reverse 0 points  Repeat phrase 0 points  Total Score 0        Clinical Support from 05/10/2019 in [facility] at [place]  AUDIT-C Score  0       Home Environment:   Lives at home with husband. Works part-time  [employer] in one story No trouble climbing stairs No scattered rugs No grab bars Adequate lighting/no clutter   Patient Active Problem List   Diagnosis Date Noted  . Benign essential HTN 03/24/2016  . 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 Tinnitus of both ears 06/08/2014     Past Medical History:  Diagnosis Date  . Hypertension   . Vertigo      Past Surgical History:  Procedure Laterality Date  . CHOLECYSTECTOMY    . FOOT SURGERY    . GASTRIC BYPASS     15 years ago at least  . TUBAL LIGATION       Family History  Problem Relation Age of Onset  . Heart disease Father   . Hypertension Father   . Heart disease Brother   . Stroke Brother   . Heart disease Paternal Grandmother   . Cancer Paternal Grandfather   . Migraines Neg Hx      Social History   Socioeconomic History  . Marital status: Married    Spouse name: Therron  . Number of children: 2  . Years of education: 22  . Highest education level: Not on file  Occupational History  . Occupation: Animator   Tobacco Use  .  Smoking status: Former Research scientist (life sciences)  . Smokeless tobacco: Never Used  Substance and Sexual Activity  . Alcohol use: No    Alcohol/week: 0.0 standard drinks  . Drug use: No  . Sexual activity: Not on file  Other Topics Concern  . Not on file  Social History Narrative  . Not on file   Social Determinants of Health   Financial Resource Strain:   . Difficulty of Paying Living Expenses: Not on file  Food Insecurity:   . Worried About Charity fundraiser in the Last Year: Not on file  . Ran Out of Food in the Last Year: Not on file  Transportation Needs:   .  Lack of Transportation (Medical): Not on file  . Lack of Transportation (Non-Medical): Not on file  Physical Activity:   . Days of Exercise per Week: Not on file  . Minutes of Exercise per Session: Not on file  Stress:   . Feeling of Stress : Not on file  Social Connections:   . Frequency of Communication with Friends and Family: Not on file  . Frequency of Social Gatherings with Friends and Family: Not on file  . Attends Religious Services: Not on file  . Active Member of Clubs or Organizations: Not on file  . Attends Archivist Meetings: Not on file  . Marital Status: Not on file  Intimate Partner Violence:   . Fear of Current or Ex-Partner: Not on file  . Emotionally Abused: Not on file  . Physically Abused: Not on file  . Sexually Abused: Not on file     Allergies  Allergen Reactions  . Penicillins Swelling     Prior to Admission medications   Medication Sig Start Date End Date Taking? Authorizing Provider  aspirin 81 MG tablet Take 81 mg by mouth daily.   Yes [provider]  clonazePAM (KLONOPIN) 0.5 MG tablet Take 1 tablet (0.5 mg total) by mouth 2 (two) times daily as needed (vertigo). 06/03/14  Yes Shawnee Knapp, MD  diclofenac (VOLTAREN) 75 MG EC tablet Take 1 tablet (75 mg total) by mouth 2 (two) times daily. Patient taking differently: Take 75 mg by mouth as needed.  02/11/18  Yes Regal, Tamala Fothergill, DPM  hydrochlorothiazide (HYDRODIURIL) 25 MG tablet Take 1 tablet (25 mg total) by mouth daily. Needs appt for Refills 03/30/19  Yes Stallings, Zoe A, MD  meloxicam (MOBIC) 15 MG tablet Take 1 tablet (15 mg total) by mouth daily. Patient taking differently: Take 15 mg by mouth as needed.  03/25/16  Yes Jeffery, Domingo Mend, PA  Multiple Vitamin (MULTI VITAMIN DAILY PO) Take 1 tablet by mouth.   Yes [provider]  POTASSIUM PO Take 1 tablet by mouth as needed.   Yes [provider]  benzonatate (TESSALON) 100 MG capsule Take 1-2 capsules  (100-200 mg total) by mouth 3 (three) times daily as needed for cough. 12/25/17   Rutherford Guys, MD  ciprofloxacin (CIPRO) 500 MG tablet Take 1 tablet (500 mg total) by mouth 2 (two) times daily. 10/31/18   Scot Jun, FNP  HYDROcodone-acetaminophen (NORCO/VICODIN) 5-325 MG tablet Take 1 tablet every 4 (four) hours as needed by mouth. Patient not taking: Reported on 05/10/2019 02/22/17   Janne Napoleon, NP  ipratropium (ATROVENT) 0.03 % nasal spray Place 2 sprays into both nostrils 2 (two) times daily. Patient not taking: Reported on 05/10/2019 12/25/17   Rutherford Guys, MD     Depression screen [facility] 2/9 05/10/2019 12/25/2017  03/19/2017 12/31/2016 12/22/2016  Decreased Interest 0 0 0 0 0  Down, Depressed, Hopeless 0 0 0 0 0  PHQ - 2 Score 0 0 0 0 0     Fall Risk  05/10/2019 12/25/2017 03/19/2017 12/31/2016 12/22/2016  Falls in the past year? 0 No No No No  Number falls in past yr: 0 - - - -  Injury with Fall? 0 - - - -  Follow up Falls evaluation completed;Education provided - - - -      PHYSICAL EXAM: BP (!) 157/84 Comment: taken from previous visit  Ht 5\' 7"  (1.702 m)   Wt 257 lb (116.6 kg)   BMI 40.25 kg/m    Wt Readings from Last 3 Encounters:  05/10/19 257 lb (116.6 kg)  12/25/17 257 lb 12.8 oz (116.9 kg)  03/19/17 250 lb (113.4 kg)      Education/Counseling provided regarding diet and exercise, prevention of chronic diseases, smoking/tobacco cessation, if applicable, and reviewed "Covered Medicare Preventive Services."

## 2019-05-10 NOTE — Telephone Encounter (Signed)
Schedule AWV.  

## 2019-05-18 ENCOUNTER — Ambulatory Visit (INDEPENDENT_AMBULATORY_CARE_PROVIDER_SITE_OTHER): Payer: Medicare Other | Admitting: Family Medicine

## 2019-05-18 ENCOUNTER — Other Ambulatory Visit: Payer: Self-pay

## 2019-05-18 ENCOUNTER — Encounter: Payer: Self-pay | Admitting: Nurse Practitioner

## 2019-05-18 ENCOUNTER — Encounter: Payer: Self-pay | Admitting: Family Medicine

## 2019-05-18 VITALS — BP 138/70 | HR 84 | Temp 97.5°F | Ht 64.5 in | Wt 262.0 lb

## 2019-05-18 DIAGNOSIS — R7301 Impaired fasting glucose: Secondary | ICD-10-CM | POA: Diagnosis not present

## 2019-05-18 DIAGNOSIS — K625 Hemorrhage of anus and rectum: Secondary | ICD-10-CM

## 2019-05-18 DIAGNOSIS — R7989 Other specified abnormal findings of blood chemistry: Secondary | ICD-10-CM

## 2019-05-18 DIAGNOSIS — Z8249 Family history of ischemic heart disease and other diseases of the circulatory system: Secondary | ICD-10-CM

## 2019-05-18 DIAGNOSIS — R072 Precordial pain: Secondary | ICD-10-CM

## 2019-05-18 DIAGNOSIS — E785 Hyperlipidemia, unspecified: Secondary | ICD-10-CM | POA: Diagnosis not present

## 2019-05-18 DIAGNOSIS — R1013 Epigastric pain: Secondary | ICD-10-CM

## 2019-05-18 DIAGNOSIS — I10 Essential (primary) hypertension: Secondary | ICD-10-CM | POA: Diagnosis not present

## 2019-05-18 LAB — POCT GLYCOSYLATED HEMOGLOBIN (HGB A1C): Hemoglobin A1C: 6.3 % — AB (ref 4.0–5.6)

## 2019-05-18 MED ORDER — HYDROCHLOROTHIAZIDE 25 MG PO TABS: 25.0000 mg | ORAL_TABLET | Freq: Every day | ORAL | 3 refills | Status: DC

## 2019-05-18 NOTE — Patient Instructions (Signed)
° ° ° °  If you have lab work done today you will be contacted with your lab results within the next 2 weeks.  If you have not heard from us then please contact us. The fastest way to get your results is to register for My Chart. ° ° °IF you received an x-ray today, you will receive an invoice from [facility]. Please contact [facility] at [phone] with questions or concerns regarding your invoice.  ° °IF you received labwork today, you will receive an invoice from [facility]. Please contact [facility] at [phone] with questions or concerns regarding your invoice.  ° °Our billing staff will not be able to assist you with questions regarding bills from these companies. ° °You will be contacted with the lab results as soon as they are available. The fastest way to get your results is to activate your My Chart account. Instructions are located on the last page of this paperwork. If you have not heard from us regarding the results in 2 weeks, please contact this office. °  ° ° ° °

## 2019-05-18 NOTE — Progress Notes (Signed)
Established Patient Office Visit  Subjective:  Patient ID: Janet Leblanc, female    DOB: Nov 29, 1950  Age: 69 y.o. MRN: [id number]  CC:  Chief Complaint  Patient presents with  . Annual Exam    CPE    HPI Janet Leblanc presents for   Abnormal TSH: patient labs last year showed concern for hypothyroidism.  She states that it was abnormal when she was child bearing She has an aunt with thyroid disease She has bleeding hemorrhoids and indigestion but no other concerns.  Epigastric Pain Family History of Heart Disease:  Pt states that she has been having burning pain beneath her chest bone and also in the upper stomach area.  She states that it improved but it took a very long time to get back to normal.  She denies radiating pain.  She has a family history of heart disease with her father having a heart attack early 58s and has 2 paternal uncles and 2 cousins with aneurysms.   She takes otc antacids No nausea or vomiting.  Colon Cancer Screening She has never had a colonoscopy She reports intermittent episodes of blood in her stool that she thinks is due to bleeding hemorrhoid. She reports that she has not had any unexpected weight loss or pain with defecation No rectal itching She does not smoke anymore and quit over 10 years ago. She does not have a family history of colon cancer        Past Medical History:  Diagnosis Date  . Hypertension   . Vertigo     Past Surgical History:  Procedure Laterality Date  . CHOLECYSTECTOMY    . FOOT SURGERY    . GASTRIC BYPASS     15 years ago at least  . TUBAL LIGATION      Family History  Problem Relation Age of Onset  . Heart disease Father   . Hypertension Father   . Heart disease Brother   . Stroke Brother   . Heart disease Paternal Grandmother   . Cancer Paternal Grandfather   . Migraines Neg Hx     Social History   Socioeconomic History  . Marital status: Married    Spouse name: Therron  . Number of children:  2  . Years of education: 22  . Highest education level: Not on file  Occupational History  . Occupation: Animator   Tobacco Use  . Smoking status: Former Research scientist (life sciences)  . Smokeless tobacco: Never Used  Substance and Sexual Activity  . Alcohol use: No    Alcohol/week: 0.0 standard drinks  . Drug use: No  . Sexual activity: Not on file  Other Topics Concern  . Not on file  Social History Narrative  . Not on file   Social Determinants of Health   Financial Resource Strain:   . Difficulty of Paying Living Expenses: Not on file  Food Insecurity:   . Worried About Charity fundraiser in the Last Year: Not on file  . Ran Out of Food in the Last Year: Not on file  Transportation Needs:   . Lack of Transportation (Medical): Not on file  . Lack of Transportation (Non-Medical): Not on file  Physical Activity:   . Days of Exercise per Week: Not on file  . Minutes of Exercise per Session: Not on file  Stress:   . Feeling of Stress : Not on file  Social Connections:   . Frequency of Communication with Friends and Family: Not on  file  . Frequency of Social Gatherings with Friends and Family: Not on file  . Attends Religious Services: Not on file  . Active Member of Clubs or Organizations: Not on file  . Attends Archivist Meetings: Not on file  . Marital Status: Not on file  Intimate Partner Violence:   . Fear of Current or Ex-Partner: Not on file  . Emotionally Abused: Not on file  . Physically Abused: Not on file  . Sexually Abused: Not on file    Outpatient Medications Prior to Visit  Medication Sig Dispense Refill  . aspirin 81 MG tablet Take 81 mg by mouth daily.    Marland Kitchen ipratropium (ATROVENT) 0.03 % nasal spray Place 2 sprays into both nostrils 2 (two) times daily. 30 mL 0  . Multiple Vitamin (MULTI VITAMIN DAILY PO) Take 1 tablet by mouth.    . hydrochlorothiazide (HYDRODIURIL) 25 MG tablet Take 1 tablet (25 mg total) by mouth daily. Needs appt for Refills 90 tablet 0   . meloxicam (MOBIC) 15 MG tablet Take 1 tablet (15 mg total) by mouth daily. (Patient taking differently: Take 15 mg by mouth as needed. ) 30 tablet 1  . POTASSIUM PO Take 1 tablet by mouth as needed.    . benzonatate (TESSALON) 100 MG capsule Take 1-2 capsules (100-200 mg total) by mouth 3 (three) times daily as needed for cough. 40 capsule 0  . ciprofloxacin (CIPRO) 500 MG tablet Take 1 tablet (500 mg total) by mouth 2 (two) times daily. 14 tablet 0  . clonazePAM (KLONOPIN) 0.5 MG tablet Take 1 tablet (0.5 mg total) by mouth 2 (two) times daily as needed (vertigo). (Patient not taking: Reported on 05/18/2019) 20 tablet 1  . diclofenac (VOLTAREN) 75 MG EC tablet Take 1 tablet (75 mg total) by mouth 2 (two) times daily. (Patient not taking: Reported on 05/18/2019) 50 tablet 2  . FLUZONE HIGH-DOSE QUADRIVALENT 0.7 ML SUSY     . HYDROcodone-acetaminophen (NORCO/VICODIN) 5-325 MG tablet Take 1 tablet every 4 (four) hours as needed by mouth. (Patient not taking: Reported on 05/10/2019) 15 tablet 0   Facility-Administered Medications Prior to Visit  Medication Dose Route Frequency Provider Last Rate Last Admin  . triamcinolone acetonide (KENALOG) 10 MG/ML injection 10 mg  10 mg Other Once Wallene Huh, DPM        Allergies  Allergen Reactions  . Penicillins Swelling    ROS Review of Systems    Objective:    Physical Exam  BP 138/70   Pulse 84   Temp (!) 97.5 F (36.4 C) (Temporal)   Ht 5' 4.5" (1.638 m)   Wt 262 lb (118.8 kg)   SpO2 97%   BMI 44.28 kg/m  Wt Readings from Last 3 Encounters:  05/18/19 262 lb (118.8 kg)  05/10/19 257 lb (116.6 kg)  12/25/17 257 lb 12.8 oz (116.9 kg)   Physical Exam  Constitutional: Oriented to person, place, and time. Appears well-developed and well-nourished.  HENT:  Head: Normocephalic and atraumatic.  Eyes: Conjunctivae and EOM are normal.  Neck: no thyromegaly, neck is supple Cardiovascular: Normal rate, regular rhythm, normal heart sounds  and intact distal pulses.  No murmur heard. Pulmonary/Chest: Effort normal and breath sounds normal. No stridor. No respiratory distress. Has no wheezes.  Neurological: Is alert and oriented to person, place, and time.  Skin: Skin is warm. Capillary refill takes less than 2 seconds.  Psychiatric: Has a normal mood and affect. Behavior is normal. Judgment and  thought content normal.    Health Maintenance Due  Topic Date Due  . TETANUS/TDAP  08/16/1970  . MAMMOGRAM  08/15/2001  . COLONOSCOPY  08/15/2001  . DEXA SCAN  08/15/2016  . PNA vac Low Risk Adult (1 of 2 - PCV13) 08/15/2016    There are no preventive care reminders to display for this patient.  Lab Results  Component Value Date   TSH 4.870 (H) 07/20/2018   Lab Results  Component Value Date   WBC 7.2 07/20/2018   HGB 14.3 07/20/2018   HCT 41.7 07/20/2018   MCV 87 07/20/2018   PLT 339 07/20/2018   Lab Results  Component Value Date   NA 140 07/20/2018   K 3.8 07/20/2018   CO2 22 07/20/2018   GLUCOSE 134 (H) 07/20/2018   BUN 12 07/20/2018   CREATININE 1.17 (H) 07/20/2018   BILITOT 0.3 07/20/2018   ALKPHOS 101 07/20/2018   AST 20 07/20/2018   ALT 18 07/20/2018   PROT 6.5 07/20/2018   ALBUMIN 4.0 07/20/2018   CALCIUM 9.2 07/20/2018   Lab Results  Component Value Date   CHOL 207 (H) 07/20/2018   Lab Results  Component Value Date   HDL 41 07/20/2018   Lab Results  Component Value Date   LDLCALC 136 (H) 07/20/2018   Lab Results  Component Value Date   TRIG 148 07/20/2018   Lab Results  Component Value Date   CHOLHDL 5.0 (H) 07/20/2018   Lab Results  Component Value Date   HGBA1C 6.3 (A) 05/18/2019     ECG - normal sinus rhythm, 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   Assessment & Plan:   Problem List Items Addressed This Visit      Cardiovascular and Mediastinum   Benign essential HTN - Primary   Relevant Medications   hydrochlorothiazide (HYDRODIURIL) 25 MG tablet   Other Relevant Orders   CMP14+EGFR      Other Visit Diagnoses    Dyslipidemia       Relevant Orders   CMP14+EGFR   Lipid panel   Ambulatory referral to Cardiology   Abnormal TSH       Relevant Orders   TSH   CBC   T4, Free   Abdominal pain, epigastric       Relevant Orders   EKG 12-Lead (Completed)   Ambulatory referral to Cardiology   Ambulatory referral to Gastroenterology   Family history of early CAD       Relevant Orders   Ambulatory referral to Cardiology   Family history of heart attack       Relevant Orders   Ambulatory referral to Cardiology   Substernal chest pain       Relevant Orders   Ambulatory referral to Cardiology   Impaired fasting glucose       Relevant Orders   POCT glycosylated hemoglobin (Hb A1C) (Completed)   Rectal bleeding       Relevant Orders   Ambulatory referral to Gastroenterology     Due to rectal bleeding and epigastric pain will refer to Gastroenterology to discuss her screenings She has never had a colonoscopy so she is due but could benefit from EGD as well  Substernal and Epigastric chest pain - pt with strong family history with risk factor of hypertension, former smoker and postmenopausal female Will refer to Cardiology for testing and risk stratification  Prediabetic Pt with a1c increased from previous but still less than 6.5% Pt noted to have some weight gain She should cut back  on calories and exercise at least 5 times a week  Essential Hypertension  -  Refilled HCTZ today Pt doing well on current meds  Abnormal TSH -   Will reassess levels      Meds ordered this encounter  Medications  . hydrochlorothiazide (HYDRODIURIL) 25 MG tablet    Sig: Take 1 tablet (25 mg total) by mouth daily.    Dispense:  90 tablet    Refill:  3    Follow-up: No follow-ups on file.   A total of 45 minutes were spent face-to-face with the patient during this encounter and over half of that time was spent on counseling and coordination of care.   Forrest Moron, MD

## 2019-05-19 LAB — LIPID PANEL
Chol/HDL Ratio: 5 ratio — ABNORMAL HIGH (ref 0.0–4.4)
Cholesterol, Total: 185 mg/dL (ref 100–199)
HDL: 37 mg/dL — ABNORMAL LOW (ref >39)
LDL Chol Calc (NIH): 116 mg/dL — ABNORMAL HIGH (ref 0–99)
Triglycerides: 183 mg/dL — ABNORMAL HIGH (ref 0–149)
VLDL Cholesterol Cal: 32 mg/dL (ref 5–40)

## 2019-05-19 LAB — CBC
Hematocrit: 42.8 % (ref 34.0–46.6)
Hemoglobin: 13.9 g/dL (ref 11.1–15.9)
MCH: 29.3 pg (ref 26.6–33.0)
MCHC: 32.5 g/dL (ref 31.5–35.7)
MCV: 90 fL (ref 79–97)
Platelets: 343 10*3/uL (ref 150–450)
RBC: 4.74 x10E6/uL (ref 3.77–5.28)
RDW: 12.8 % (ref 11.7–15.4)
WBC: 9.8 10*3/uL (ref 3.4–10.8)

## 2019-05-19 LAB — CMP14+EGFR
ALT: 20 IU/L (ref 0–32)
AST: 21 IU/L (ref 0–40)
Albumin/Globulin Ratio: 1.5 (ref 1.2–2.2)
Albumin: 4.1 g/dL (ref 3.8–4.8)
Alkaline Phosphatase: 109 IU/L (ref 39–117)
BUN/Creatinine Ratio: 15 (ref 12–28)
BUN: 19 mg/dL (ref 8–27)
Bilirubin Total: 0.3 mg/dL (ref 0.0–1.2)
CO2: 26 mmol/L (ref 20–29)
Calcium: 9.1 mg/dL (ref 8.7–10.3)
Chloride: 99 mmol/L (ref 96–106)
Creatinine, Ser: 1.23 mg/dL — ABNORMAL HIGH (ref 0.57–1.00)
GFR calc Af Amer: 52 mL/min/{1.73_m2} — ABNORMAL LOW (ref >59)
GFR calc non Af Amer: 45 mL/min/{1.73_m2} — ABNORMAL LOW (ref >59)
Globulin, Total: 2.7 g/dL (ref 1.5–4.5)
Glucose: 91 mg/dL (ref 65–99)
Potassium: 3.5 mmol/L (ref 3.5–5.2)
Sodium: 139 mmol/L (ref 134–144)
Total Protein: 6.8 g/dL (ref 6.0–8.5)

## 2019-05-19 LAB — T4, FREE: Free T4: 1.16 ng/dL (ref 0.82–1.77)

## 2019-05-19 LAB — TSH: TSH: 4.94 u[IU]/mL — ABNORMAL HIGH (ref 0.450–4.500)

## 2019-05-25 NOTE — Progress Notes (Signed)
Virtual Visit via Telephone Note   This visit type was conducted due to national recommendations for restrictions regarding the COVID-19 Pandemic (e.g. social distancing) in an effort to limit this patient's exposure and mitigate transmission in our community.  Due to her co-morbid illnesses, this patient is at least at moderate risk for complications without adequate follow up.  This format is felt to be most appropriate for this patient at this time.  The patient did not have access to video technology/had technical difficulties with video requiring transitioning to audio format only (telephone).  All issues noted in this document were discussed and addressed.  No physical exam could be performed with this format.  Please refer to the patient's chart for her  consent to telehealth for [facility].   Date:  05/26/2019   ID:  Janet Leblanc, DOB 1950/05/14, MRN [id number]  Patient Location: Home Provider Location: Home  PCP:  Forrest Moron, MD  Cardiologist:  No primary care provider on file.   Evaluation Performed:  New Patient Evaluation  Chief Complaint:  Chest pain  History of Present Illness:    Janet Leblanc is a 69 y.o. female with hypertension who presents for the evaluation of chest pain, at the request of Delia Chimes A, MD. She reports she would wake up in the night with heart burn symptoms. Lasted nightly for 2 weeks. Seems to have resolved. Reports she had a burning/sharp pain in her chest. Last 15-20 minutes and resolved with indigestion medications. Works in [employer] and has no limitations such as SOB, or CP. EKG with LAD. No medications reported. She reports family members who also had aneurysms. Appear to be AAA from report. T chol 185, HDL 37, LDL 116, TG 32, A1c 6.3.  No active complaints of chest pain or shortness of breath during our telephone visit.  She seems to be doing well.  She will be evaluated by gastroenterology for heartburn symptoms.  The patient  does not have symptoms concerning for COVID-19 infection (fever, chills, cough, or new shortness of breath).    Past Medical History:  Diagnosis Date  . Hypertension   . Vertigo    Past Surgical History:  Procedure Laterality Date  . CHOLECYSTECTOMY    . FOOT SURGERY    . GASTRIC BYPASS     15 years ago at least  . TUBAL LIGATION       Current Meds  Medication Sig  . aspirin 81 MG tablet Take 81 mg by mouth daily.  . hydrochlorothiazide (HYDRODIURIL) 25 MG tablet Take 1 tablet (25 mg total) by mouth daily.  Marland Kitchen ipratropium (ATROVENT) 0.03 % nasal spray Place 2 sprays into both nostrils 2 (two) times daily.  . Multiple Vitamin (MULTI VITAMIN DAILY PO) Take 1 tablet by mouth.  Marland Kitchen POTASSIUM PO Take 1 tablet by mouth as needed.   Current Facility-Administered Medications for the 05/26/19 encounter (Telemedicine) with O'Neal, Cassie Freer, MD  Medication  . triamcinolone acetonide (KENALOG) 10 MG/ML injection 10 mg     Allergies:   Penicillins   Social History   Tobacco Use  . Smoking status: Former Smoker    Years: 15.00    Types: Cigarettes  . Smokeless tobacco: Never Used  Substance Use Topics  . Alcohol use: No    Alcohol/week: 0.0 standard drinks  . Drug use: No     Family Hx: The patient's family history includes Cancer in her paternal grandfather; Heart disease in her brother, father, and  paternal grandmother; Hypertension in her father; Stroke in her brother. There is no history of Migraines.  ROS:   Please see the history of present illness.     All other systems reviewed and are negative.   Prior CV studies:   The following studies were reviewed today:  Labs/Other Tests and Data Reviewed:    EKG: EKG dated 05/18/2019 from primary care physician office demonstrates normal sinus rhythm with no acute ST-T changes or evidence of prior infarction  Recent Labs: 05/18/2019: ALT 20; BUN 19; Creatinine, Ser 1.23; Hemoglobin 13.9; Platelets 343; Potassium 3.5;  Sodium 139; TSH 4.940   Recent Lipid Panel Lab Results  Component Value Date/Time   CHOL 185 05/18/2019 02:29 PM   TRIG 183 (H) 05/18/2019 02:29 PM   HDL 37 (L) 05/18/2019 02:29 PM   CHOLHDL 5.0 (H) 05/18/2019 02:29 PM   CHOLHDL 5.8 11/10/2013 02:34 PM   LDLCALC 116 (H) 05/18/2019 02:29 PM    Wt Readings from Last 3 Encounters:  05/26/19 260 lb (117.9 kg)  05/18/19 262 lb (118.8 kg)  05/10/19 257 lb (116.6 kg)     Objective:    Vital Signs:  Ht 5' 4.5" (1.638 m)   Wt 260 lb (117.9 kg)   BMI 43.94 kg/m    VITAL SIGNS:  reviewed  Gen: NAD Pulm: Talking complete sentences, no SOB Psych: normal mood/affect   ASSESSMENT & PLAN:    1. Chest pain, unspecified type -Atypical pain.  More GERD-like symptoms than anything.  EKG recently with normal sinus rhythm and no prior infarct or ischemic changes.  Her chest pain symptoms have resolved.  She will be evaluated by gastroenterology.  Due to the EKG which is little abnormal, we will proceed with an echocardiogram.  Given no active complaints we will hold on any cardiac stress test at this time.  I will plan to see her in about 3 months to reevaluate symptoms to determine if we need to do anything else.  She does have a strong family history of cardiovascular disease as well as risk factors include former tobacco abuse and hypertension.  She is not diabetic but prediabetic.  She will continue to work on diet and exercise.  2. Essential hypertension -Appears to be well controlled per review of medical record.  3. Nonspecific abnormal electrocardiogram (ECG) (EKG) -Echocardiogram as above.   COVID-19 Education: The signs and symptoms of COVID-19 were discussed with the patient and how to seek care for testing (follow up with PCP or arrange E-visit).  The importance of social distancing was discussed today.  Time:   Today, I have spent 35 minutes with the patient with telehealth technology discussing the above problems.      Medication Adjustments/Labs and Tests Ordered: Current medicines are reviewed at length with the patient today.  Concerns regarding medicines are outlined above.   Tests Ordered: No orders of the defined types were placed in this encounter.   Medication Changes: No orders of the defined types were placed in this encounter.   Follow Up:  In Person in 3 month(s)  Signed, Evalina Field, MD  05/26/2019 11:55 AM    [place]

## 2019-05-26 ENCOUNTER — Encounter: Payer: Self-pay | Admitting: Cardiovascular Disease

## 2019-05-26 ENCOUNTER — Telehealth (INDEPENDENT_AMBULATORY_CARE_PROVIDER_SITE_OTHER): Payer: Medicare Other | Admitting: Cardiovascular Disease

## 2019-05-26 ENCOUNTER — Other Ambulatory Visit: Payer: Self-pay

## 2019-05-26 VITALS — Ht 64.5 in | Wt 260.0 lb

## 2019-05-26 DIAGNOSIS — R079 Chest pain, unspecified: Secondary | ICD-10-CM | POA: Diagnosis not present

## 2019-05-26 DIAGNOSIS — I10 Essential (primary) hypertension: Secondary | ICD-10-CM

## 2019-05-26 DIAGNOSIS — R9431 Abnormal electrocardiogram [ECG] [EKG]: Secondary | ICD-10-CM | POA: Diagnosis not present

## 2019-05-26 NOTE — Patient Instructions (Signed)
Medication Instructions:  The current medical regimen is effective;  continue present plan and medications.  *If you need a refill on your cardiac medications before your next appointment, please call your pharmacy*  Testing/Procedures: Echocardiogram - Your physician has requested that you have an echocardiogram. Echocardiography is a painless test that uses sound waves to create images of your heart. It provides your doctor with information about the size and shape of your heart and how well your heart's chambers and valves are working. This procedure takes approximately one hour. There are no restrictions for this procedure. This will be performed at our [facility] location - [address], [address].   Follow-Up: At [facility], you and your health needs are our priority.  As part of our continuing mission to provide you with exceptional heart care, we have created designated Provider Care Teams.  These Care Teams include your primary Cardiologist (physician) and Advanced Practice Providers (APPs -  Physician Assistants and Nurse Practitioners) who all work together to provide you with the care you need, when you need it.  Your next appointment:   3 month(s)  The format for your next appointment:   In Person  Provider:   Eleonore Chiquito, MD

## 2019-05-27 ENCOUNTER — Telehealth: Payer: Self-pay | Admitting: Family Medicine

## 2019-05-27 NOTE — Telephone Encounter (Signed)
Pt calling back and requesting explaination for her lab results

## 2019-05-30 ENCOUNTER — Encounter: Payer: Self-pay | Admitting: Nurse Practitioner

## 2019-05-30 ENCOUNTER — Other Ambulatory Visit: Payer: Self-pay

## 2019-05-30 ENCOUNTER — Ambulatory Visit: Payer: Medicare Other | Admitting: Nurse Practitioner

## 2019-05-30 VITALS — BP 152/86 | HR 76 | Temp 98.3°F | Ht 64.5 in | Wt 260.0 lb

## 2019-05-30 DIAGNOSIS — Z1211 Encounter for screening for malignant neoplasm of colon: Secondary | ICD-10-CM

## 2019-05-30 DIAGNOSIS — K625 Hemorrhage of anus and rectum: Secondary | ICD-10-CM

## 2019-05-30 DIAGNOSIS — Z01818 Encounter for other preprocedural examination: Secondary | ICD-10-CM | POA: Diagnosis not present

## 2019-05-30 MED ORDER — OMEPRAZOLE 20 MG PO CPDR: 20.0000 mg | DELAYED_RELEASE_CAPSULE | ORAL | 3 refills | Status: DC

## 2019-05-30 MED ORDER — NA SULFATE-K SULFATE-MG SULF 17.5-3.13-1.6 GM/177ML PO SOLN: ORAL | 0 refills | Status: DC

## 2019-05-30 NOTE — Progress Notes (Signed)
Referring Provider: Delia Chimes, MD  Reason for Referral :  Epigastric pain and rectal bleeding              ASSESSMENT / PLAN:    69 yo  female with a pmh significant for but not may not be limited to remote gastric bypass, cholecystectomy, hypertension  # Colon cancer screening.   -This will be her first colonoscopy. The risks and benefits of colonoscopy with possible polypectomy / biopsies were discussed and the patient agrees to proceed.   # Nocturnal epigastric / xiphoid area pain for 1-2 weeks sometime in October or November.  -Spontaneously resolved, no pain or any other GI symptoms since.  -Etiology of pain unclear.  No work-up now since pain has resolved.  Patient will call if she has any recurrent pain or develops any GERD symptoms, dysphagia, etc.  # NSAID use -Chronic NSAID use in the form of Aleve several times a week.  She is at increased risk for bleeding especially given history of gastric bypass.  Tylenol ineffective for her joint pain Recommend omeprazole 20 mg every other day for prophylaxis   # chronic,  intermittent painless bleeding with bowel movements -Probably hemorrhoidal.  Further evaluation at time of colonoscopy   HPI:     Chief Complaint:   Abdominal pain in Oct or November and rectal bleeding  Janet Leblanc is a 69 y.o. female, new to the practice, with PMH as listed below.  For about 1.5 to 2 weeks sometime in October or November patient woke up in the middle of the night with pain in the xiphoid area.  Pain was transient, no aggravating or alleviating factors. It  was not associated with any shortness of breath or palpitations.  She has no history of GERD / dysphagia / unintended weight loss, nausea / vomiting.   No pain  through the month of December January or so far this month. She was concerned when pain started, brother had throat cancer.  Unclear why the pain spontaneously resolved.  No changes in medications, no dietary changes.  She is for  an echocardiogram soon.   Janet Leblanc gives a history of chronic, intermittent painless rectal bleeding which she attributes to hemorrhoids after delivery of children 40 years ago.  Her bowel movements are at baseline, certain foods/drinks can cause some urgency but this is normal for her.  She has never had a colonoscopy.  No family history of colon cancer.   Data Reviewed:   05/18/19 Cr 1.23, CMET o/w ok CBC normal.    Past Medical History:  Diagnosis Date  . Hypertension   . Vertigo      Past Surgical History:  Procedure Laterality Date  . CHOLECYSTECTOMY    . FOOT SURGERY    . GASTRIC BYPASS     15 years ago at least  . TUBAL LIGATION     Family History  Problem Relation Age of Onset  . Heart disease Father   . Hypertension Father   . Heart disease Brother   . Stroke Brother   . Throat cancer Brother   . Heart disease Paternal Grandmother   . Cancer Paternal Grandfather   . Migraines Neg Hx    Social History   Tobacco Use  . Smoking status: Former Smoker    Years: 15.00    Types: Cigarettes  . Smokeless tobacco: Never Used  Substance Use Topics  . Alcohol use: No    Alcohol/week: 0.0 standard drinks  . Drug  use: No   Current Outpatient Medications  Medication Sig Dispense Refill  . aspirin 81 MG tablet Take 81 mg by mouth daily.    . hydrochlorothiazide (HYDRODIURIL) 25 MG tablet Take 1 tablet (25 mg total) by mouth daily. 90 tablet 3  . ipratropium (ATROVENT) 0.03 % nasal spray Place 2 sprays into both nostrils 2 (two) times daily. 30 mL 0  . Multiple Vitamin (MULTI VITAMIN DAILY PO) Take 1 tablet by mouth.    Marland Kitchen POTASSIUM PO Take 1 tablet by mouth as needed.     Current Facility-Administered Medications  Medication Dose Route Frequency Provider Last Rate Last Admin  . triamcinolone acetonide (KENALOG) 10 MG/ML injection 10 mg  10 mg Other Once Wallene Huh, DPM       Allergies  Allergen Reactions  . Penicillins Swelling     Review of Systems: All  systems reviewed and negative except where noted in HPI.   Serum creatinine: 1.23 mg/dL (H) 05/18/19 1429 Estimated creatinine clearance: 56.5 mL/min (A)   Physical Exam:    Wt Readings from Last 3 Encounters:  05/30/19 260 lb (117.9 kg)  05/26/19 260 lb (117.9 kg)  05/18/19 262 lb (118.8 kg)    Wt 260 lb (117.9 kg)   BMI 43.94 kg/m  Constitutional:  Pleasant female in no acute distress. Psychiatric: Normal mood and affect. Behavior is normal. EENT: Pupils normal.  Conjunctivae are normal. No scleral icterus. Neck supple.  Cardiovascular: Normal rate, regular rhythm. No edema Pulmonary/chest: Effort normal and breath sounds normal. No wheezing, rales or rhonchi. Abdominal: Soft, nondistended, nontender. Bowel sounds active throughout. There are no masses palpable. No hepatomegaly. Neurological: Alert and oriented to person place and time. Skin: Skin is warm and dry. No rashes noted.  Tye Savoy, NP  05/30/2019, 2:39 PM  Cc:  Referring Provider Forrest Moron, MD

## 2019-05-30 NOTE — Patient Instructions (Addendum)
If you are age 68 or older, your body mass index should be between 23-30. Your Body mass index is 43.94 kg/m. If this is out of the aforementioned range listed, please consider follow up with your Primary Care Provider.  If you are age 21 or younger, your body mass index should be between 19-25. Your Body mass index is 43.94 kg/m. If this is out of the aformentioned range listed, please consider follow up with your Primary Care Provider.   You have been scheduled for a colonoscopy. Please follow written instructions given to you at your visit today.  Please pick up your prep supplies at the pharmacy within the next 1-3 days. If you use inhalers (even only as needed), please bring them with you on the day of your procedure. Your physician has requested that you go to www.startemmi.com and enter the access code given to you at your visit today. This web site gives a general overview about your procedure. However, you should still follow specific instructions given to you by our office regarding your preparation for the procedure.  We have sent the following medications to your pharmacy for you to pick up at your convenience: Suprep  Thank you for choosing me and [facility].   Tye Savoy, NP

## 2019-06-01 NOTE — Progress Notes (Signed)
____________________________________________________________  Attending physician addendum:  Thank you for sending this case to me. I have reviewed the entire note, and the outlined plan seems appropriate.  Ryken Paschal Danis, MD  ____________________________________________________________  

## 2019-06-02 ENCOUNTER — Telehealth: Payer: Self-pay | Admitting: Family Medicine

## 2019-06-02 NOTE — Telephone Encounter (Signed)
I do not see that this was sent in.

## 2019-06-02 NOTE — Telephone Encounter (Signed)
Patient would like a call back regarding recent labs / ok leave a detail message regarding labs

## 2019-06-02 NOTE — Telephone Encounter (Signed)
Pt has been calling looking for Thyroid medicine to be filled / has not heard anything from [place] /patient called [facility] and have not received ed an order for  Thyroid medicine

## 2019-06-03 ENCOUNTER — Other Ambulatory Visit (HOSPITAL_COMMUNITY): Payer: Medicare Other

## 2019-06-03 NOTE — Telephone Encounter (Signed)
Pt calling back in regards to her lab results for A1C and to say that the medication for thyroid had not been received , She said she missed a call about her results   Please advise pcp

## 2019-06-06 ENCOUNTER — Telehealth: Payer: Self-pay

## 2019-06-06 MED ORDER — LEVOTHYROXINE SODIUM 50 MCG PO TABS: 50.0000 ug | ORAL_TABLET | ORAL | 1 refills | Status: DC

## 2019-06-06 NOTE — Telephone Encounter (Signed)
Janet Leblanc calling in to advise her thyroid medication has not been sent in since receiving her lab results.  Lab results given again and advised dr Nolon Rod would send in low dose thyroid medication to [facility].  Synthyroid 50 mcg #45 take one tablet every other day.  1 refill given.  Janet Leblanc notified and agreeable.  I did apologize to Janet Leblanc for the delay in getting her medication.

## 2019-06-09 ENCOUNTER — Other Ambulatory Visit (HOSPITAL_COMMUNITY): Payer: Medicare Other

## 2019-06-10 ENCOUNTER — Encounter: Payer: Self-pay | Admitting: Gastroenterology

## 2019-06-16 ENCOUNTER — Ambulatory Visit (INDEPENDENT_AMBULATORY_CARE_PROVIDER_SITE_OTHER): Payer: Medicare Other

## 2019-06-16 ENCOUNTER — Other Ambulatory Visit: Payer: Self-pay | Admitting: Gastroenterology

## 2019-06-16 DIAGNOSIS — Z1159 Encounter for screening for other viral diseases: Secondary | ICD-10-CM | POA: Diagnosis not present

## 2019-06-16 LAB — SARS CORONAVIRUS 2 (TAT 6-24 HRS): SARS Coronavirus 2: NEGATIVE

## 2019-06-17 NOTE — Telephone Encounter (Signed)
No action needed

## 2019-06-20 ENCOUNTER — Encounter: Payer: Self-pay | Admitting: Gastroenterology

## 2019-06-20 ENCOUNTER — Ambulatory Visit (AMBULATORY_SURGERY_CENTER): Payer: Medicare Other | Admitting: Gastroenterology

## 2019-06-20 ENCOUNTER — Other Ambulatory Visit: Payer: Self-pay

## 2019-06-20 VITALS — BP 136/68 | HR 63 | Temp 97.3°F | Resp 14 | Ht 64.5 in | Wt 260.0 lb

## 2019-06-20 DIAGNOSIS — Z1211 Encounter for screening for malignant neoplasm of colon: Secondary | ICD-10-CM

## 2019-06-20 HISTORY — PX: COLONOSCOPY: SHX174

## 2019-06-20 MED ORDER — SODIUM CHLORIDE 0.9 % IV SOLN: 500.0000 mL | Freq: Once | INTRAVENOUS | Status: DC

## 2019-06-20 NOTE — Progress Notes (Signed)
To PACU, VSS. Report to Rn.tb 

## 2019-06-20 NOTE — Progress Notes (Signed)
Temp JB Vitals CW 

## 2019-06-20 NOTE — Op Note (Signed)
[place] Patient Name: Janet Leblanc Procedure Date: 06/20/2019 1:25 PM MRN: [id number] Endoscopist: Mallie Mussel L. Loletha Carrow , MD Age: 69 Referring MD:  Date of Birth: Oct 04, 1950 Gender: Female Account #: [id number] Procedure:                Colonoscopy Indications:              Screening for colorectal malignant neoplasm, This                            is the patient's first colonoscopy Medicines:                Monitored Anesthesia Care Procedure:                Pre-Anesthesia Assessment:                           - Prior to the procedure, a History and Physical                            was performed, and patient medications and                            allergies were reviewed. The patient's tolerance of                            previous anesthesia was also reviewed. The risks                            and benefits of the procedure and the sedation                            options and risks were discussed with the patient.                            All questions were answered, and informed consent                            was obtained. Prior Anticoagulants: The patient has                            taken no previous anticoagulant or antiplatelet                            agents. ASA Grade Assessment: III - A patient with                            severe systemic disease. After reviewing the risks                            and benefits, the patient was deemed in                            satisfactory condition to undergo the procedure.  After obtaining informed consent, the colonoscope                            was passed under direct vision. Throughout the                            procedure, the patient's blood pressure, pulse, and                            oxygen saturations were monitored continuously. The                            Colonoscope was introduced through the anus and                            advanced to the the cecum,  identified by                            appendiceal orifice and ileocecal valve. The                            colonoscopy was performed without difficulty. The                            patient tolerated the procedure well. The quality                            of the bowel preparation was excellent. The                            ileocecal valve, appendiceal orifice, and rectum                            were photographed. Scope In: 1:34:12 PM Scope Out: 1:52:16 PM Scope Withdrawal Time: 0 hours 11 minutes 56 seconds  Total Procedure Duration: 0 hours 18 minutes 4 seconds  Findings:                 The perianal and digital rectal examinations were                            normal.                           There is no endoscopic evidence of polyps in the                            entire colon.                           The exam was otherwise without abnormality on                            direct and retroflexion views.  A few diverticula were found in the right colon. Complications:            No immediate complications. Estimated Blood Loss:     Estimated blood loss: none. Impression:               - The examination was otherwise normal on direct                            and retroflexion views.                           - Diverticulosis in the right colon.                           - No specimens collected.                           No enlarged internal hemorrhoids seen today.                            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 Recommendation:           - Patient has a contact number available for                            emergencies. The signs and symptoms of potential                            delayed complications were discussed with the                            patient. Return to normal  activities tomorrow.                            Written discharge instructions were provided to the                            patient.                           - Resume previous diet.                           - Continue present medications.                           - Based on current guidelines, with no polyps seen                            at this age, no repeat screening colonoscopy. Erick Oxendine L. Loletha Carrow, MD 06/20/2019 2:00:24 PM This report has been  signed electronically.

## 2019-06-20 NOTE — Patient Instructions (Signed)
Please read handouts provided. Continue present medications.   YOU HAD AN ENDOSCOPIC PROCEDURE TODAY AT THE [facility]:   Refer to the procedure report that was given to you for any specific questions about what was found during the examination.  If the procedure report does not answer your questions, please call your gastroenterologist to clarify.  If you requested that your care partner not be given the details of your procedure findings, then the procedure report has been included in a sealed envelope for you to review at your convenience later.  YOU SHOULD EXPECT: Some feelings of bloating in the abdomen. Passage of more gas than usual.  Walking can help get rid of the air that was put into your GI tract during the procedure and reduce the bloating. If you had a lower endoscopy (such as a colonoscopy or flexible sigmoidoscopy) you may notice spotting of blood in your stool or on the toilet paper. If you underwent a bowel prep for your procedure, you may not have a normal bowel movement for a few days.  Please Note:  You might notice some irritation and congestion in your nose or some drainage.  This is from the oxygen used during your procedure.  There is no need for concern and it should clear up in a day or so.  SYMPTOMS TO REPORT IMMEDIATELY:  Following lower endoscopy (colonoscopy or flexible sigmoidoscopy):  Excessive amounts of blood in the stool  Significant tenderness or worsening of abdominal pains  Swelling of the abdomen that is new, acute  Fever of 100F or higher   For urgent or emergent issues, a gastroenterologist can be reached at any hour by calling [phone]. Do not use MyChart messaging for urgent concerns.    DIET:  We do recommend a small meal at first, but then you may proceed to your regular diet.  Drink plenty of fluids but you should avoid alcoholic beverages for 24 hours.  ACTIVITY:  You should plan to take it easy for the rest of today and  you should NOT DRIVE or use heavy machinery until tomorrow (because of the sedation medicines used during the test).    FOLLOW UP: Our staff will call the number listed on your records 48-72 hours following your procedure to check on you and address any questions or concerns that you may have regarding the information given to you following your procedure. If we do not reach you, we will leave a message.  We will attempt to reach you two times.  During this call, we will ask if you have developed any symptoms of COVID 19. If you develop any symptoms (ie: fever, flu-like symptoms, shortness of breath, cough etc.) before then, please call [phone].  If you test positive for Covid 19 in the 2 weeks post procedure, please call and report this information to us.    If any biopsies were taken you will be contacted by phone or by letter within the next 1-3 weeks.  Please call us at [phone] if you have not heard about the biopsies in 3 weeks.    SIGNATURES/CONFIDENTIALITY: You and/or your care partner have signed paperwork which will be entered into your electronic medical record.  These signatures attest to the fact that that the information above on your After Visit Summary has been reviewed and is understood.  Full responsibility of the confidentiality of this discharge information lies with you and/or your care-partner.  

## 2019-06-21 ENCOUNTER — Ambulatory Visit (HOSPITAL_COMMUNITY): Payer: Medicare Other | Attending: Cardiology

## 2019-06-21 DIAGNOSIS — R079 Chest pain, unspecified: Secondary | ICD-10-CM | POA: Diagnosis not present

## 2019-06-22 ENCOUNTER — Telehealth: Payer: Self-pay

## 2019-06-22 NOTE — Telephone Encounter (Signed)
  Follow up Call-  Call back number 06/20/2019  Post procedure Call Back phone  # [phone]  Permission to leave phone message Yes  Some recent data might be hidden     Left message

## 2019-06-22 NOTE — Telephone Encounter (Signed)
First attempt follow up call to pt, left vm

## 2019-06-23 ENCOUNTER — Ambulatory Visit: Payer: Medicare Other | Admitting: Cardiovascular Disease

## 2019-07-06 ENCOUNTER — Encounter: Payer: Self-pay | Admitting: Registered Nurse

## 2019-07-06 ENCOUNTER — Other Ambulatory Visit: Payer: Self-pay

## 2019-07-06 ENCOUNTER — Ambulatory Visit (INDEPENDENT_AMBULATORY_CARE_PROVIDER_SITE_OTHER): Payer: Medicare Other | Admitting: Registered Nurse

## 2019-07-06 VITALS — BP 125/78 | HR 85 | Temp 98.0°F | Resp 16 | Ht 66.0 in | Wt 263.8 lb

## 2019-07-06 DIAGNOSIS — R350 Frequency of micturition: Secondary | ICD-10-CM | POA: Diagnosis not present

## 2019-07-06 LAB — POCT URINALYSIS DIP (CLINITEK)
Bilirubin, UA: NEGATIVE
Glucose, UA: NEGATIVE mg/dL
Ketones, POC UA: NEGATIVE mg/dL
Nitrite, UA: NEGATIVE
POC PROTEIN,UA: NEGATIVE
Spec Grav, UA: 1.02 (ref 1.010–1.025)
Urobilinogen, UA: 0.2 E.U./dL
pH, UA: 7 (ref 5.0–8.0)

## 2019-07-06 MED ORDER — SULFAMETHOXAZOLE-TRIMETHOPRIM 800-160 MG PO TABS: 1.0000 | ORAL_TABLET | Freq: Two times a day (BID) | ORAL | 0 refills | Status: DC

## 2019-07-06 NOTE — Patient Instructions (Signed)
° ° ° °  If you have lab work done today you will be contacted with your lab results within the next 2 weeks.  If you have not heard from us then please contact us. The fastest way to get your results is to register for My Chart. ° ° °IF you received an x-ray today, you will receive an invoice from [facility]. Please contact  Radiology at [phone] with questions or concerns regarding your invoice.  ° °IF you received labwork today, you will receive an invoice from [facility]. Please contact [facility] at [phone] with questions or concerns regarding your invoice.  ° °Our billing staff will not be able to assist you with questions regarding bills from these companies. ° °You will be contacted with the lab results as soon as they are available. The fastest way to get your results is to activate your My Chart account. Instructions are located on the last page of this paperwork. If you have not heard from us regarding the results in 2 weeks, please contact this office. °  ° ° ° °

## 2019-07-08 LAB — URINE CULTURE

## 2019-07-18 NOTE — Progress Notes (Signed)
Acute Office Visit  Subjective:    Patient ID: Janet Leblanc, female    DOB: November 02, 1950, 69 y.o.   MRN: [id number]  Chief Complaint  Patient presents with  . Back Pain    LOWER - per pt started yesterday  . Urinary Frequency    denies pain with urinating    HPI Patient is in today for lower back pain, suprapubic pain, and urinary frequency Denies dysuria, fever, chills, nvd. Has had hx of UTI, feels this is the same Denies flank pain and gross hematuria No concern for STI exposure No vaginal symptoms  Past Medical History:  Diagnosis Date  . Allergy    seasonal  . Arthritis   . Hypertension   . Thyroid disease   . Vertigo     Past Surgical History:  Procedure Laterality Date  . CHOLECYSTECTOMY    . COLONOSCOPY  06/20/2019  . FOOT SURGERY    . GASTRIC BYPASS     15 years ago at least  . TUBAL LIGATION      Family History  Problem Relation Age of Onset  . Heart disease Father   . Hypertension Father   . Heart disease Brother   . Stroke Brother   . Throat cancer Brother   . Heart disease Paternal Grandmother   . Cancer Paternal Grandfather   . Migraines Neg Hx   . Colon polyps Neg Hx   . Colon cancer Neg Hx   . Esophageal cancer Neg Hx   . Rectal cancer Neg Hx   . Stomach cancer Neg Hx     Social History   Socioeconomic History  . Marital status: Married    Spouse name: Therron  . Number of children: 2  . Years of education: 2  . Highest education level: Not on file  Occupational History  . Occupation: Animator   Tobacco Use  . Smoking status: Former Smoker    Years: 15.00    Types: Cigarettes    Quit date: 1990    Years since quitting: 31.2  . Smokeless tobacco: Never Used  Substance and Sexual Activity  . Alcohol use: No    Alcohol/week: 0.0 standard drinks  . Drug use: No  . Sexual activity: Not on file  Other Topics Concern  . Not on file  Social History Narrative  . Not on file   Social Determinants of Health   Financial  Resource Strain:   . Difficulty of Paying Living Expenses:   Food Insecurity:   . Worried About Charity fundraiser in the Last Year:   . Arboriculturist in the Last Year:   Transportation Needs:   . Film/video editor (Medical):   Marland Kitchen Lack of Transportation (Non-Medical):   Physical Activity:   . Days of Exercise per Week:   . Minutes of Exercise per Session:   Stress:   . Feeling of Stress :   Social Connections:   . Frequency of Communication with Friends and Family:   . Frequency of Social Gatherings with Friends and Family:   . Attends Religious Services:   . Active Member of Clubs or Organizations:   . Attends Archivist Meetings:   Marland Kitchen Marital Status:   Intimate Partner Violence:   . Fear of Current or Ex-Partner:   . Emotionally Abused:   Marland Kitchen Physically Abused:   . Sexually Abused:     Outpatient Medications Prior to Visit  Medication Sig Dispense Refill  . aspirin  81 MG tablet Take 81 mg by mouth daily.    . hydrochlorothiazide (HYDRODIURIL) 25 MG tablet Take 1 tablet (25 mg total) by mouth daily. 90 tablet 3  . ipratropium (ATROVENT) 0.03 % nasal spray Place 2 sprays into both nostrils 2 (two) times daily. 30 mL 0  . levothyroxine (SYNTHROID) 50 MCG tablet Take 1 tablet (50 mcg total) by mouth every other day. 45 tablet 1  . Multiple Vitamin (MULTI VITAMIN DAILY PO) Take 1 tablet by mouth.    Marland Kitchen omeprazole (PRILOSEC) 20 MG capsule Take 1 capsule (20 mg total) by mouth every other day. 45 capsule 3  . POTASSIUM PO Take 1 tablet by mouth as needed.     No facility-administered medications prior to visit.    Allergies  Allergen Reactions  . Penicillins Swelling    Review of Systems  Constitutional: Negative.   HENT: Negative.   Eyes: Negative.   Respiratory: Negative.   Cardiovascular: Negative.   Gastrointestinal: Negative.   Endocrine: Negative.   Genitourinary: Negative.   Musculoskeletal: Negative.   Skin: Negative.   Allergic/Immunologic:  Negative.   Neurological: Negative.   Hematological: Negative.   Psychiatric/Behavioral: Negative.   All other systems reviewed and are negative.      Objective:    Physical Exam Vitals and nursing note reviewed.  Constitutional:      General: She is not in acute distress.    Appearance: Normal appearance. She is normal weight. She is not ill-appearing, toxic-appearing or diaphoretic.  Skin:    General: Skin is warm and dry.     Capillary Refill: Capillary refill takes less than 2 seconds.     Coloration: Skin is not jaundiced or pale.     Findings: No bruising, erythema, lesion or rash.  Neurological:     General: No focal deficit present.     Mental Status: She is alert and oriented to person, place, and time. Mental status is at baseline.     Cranial Nerves: No cranial nerve deficit.  Psychiatric:        Mood and Affect: Mood normal.        Behavior: Behavior normal.        Thought Content: Thought content normal.        Judgment: Judgment normal.     BP 125/78   Pulse 85   Temp 98 F (36.7 C) (Temporal)   Resp 16   Ht 5\' 6"  (1.676 m)   Wt 263 lb 12.8 oz (119.7 kg)   SpO2 99%   BMI 42.58 kg/m  Wt Readings from Last 3 Encounters:  07/06/19 263 lb 12.8 oz (119.7 kg)  06/20/19 260 lb (117.9 kg)  05/30/19 260 lb (117.9 kg)    There are no preventive care reminders to display for this patient.  There are no preventive care reminders to display for this patient.   Lab Results  Component Value Date   TSH 4.940 (H) 05/18/2019   Lab Results  Component Value Date   WBC 9.8 05/18/2019   HGB 13.9 05/18/2019   HCT 42.8 05/18/2019   MCV 90 05/18/2019   PLT 343 05/18/2019   Lab Results  Component Value Date   NA 139 05/18/2019   K 3.5 05/18/2019   CO2 26 05/18/2019   GLUCOSE 91 05/18/2019   BUN 19 05/18/2019   CREATININE 1.23 (H) 05/18/2019   BILITOT 0.3 05/18/2019   ALKPHOS 109 05/18/2019   AST 21 05/18/2019   ALT 20 05/18/2019  PROT 6.8 05/18/2019     ALBUMIN 4.1 05/18/2019   CALCIUM 9.1 05/18/2019   Lab Results  Component Value Date   CHOL 185 05/18/2019   Lab Results  Component Value Date   HDL 37 (L) 05/18/2019   Lab Results  Component Value Date   LDLCALC 116 (H) 05/18/2019   Lab Results  Component Value Date   TRIG 183 (H) 05/18/2019   Lab Results  Component Value Date   CHOLHDL 5.0 (H) 05/18/2019   Lab Results  Component Value Date   HGBA1C 6.3 (A) 05/18/2019       Assessment & Plan:   Problem List Items Addressed This Visit    None    Visit Diagnoses    Urinary frequency    -  Primary   Relevant Medications   sulfamethoxazole-trimethoprim (BACTRIM DS) 800-160 MG tablet   Other Relevant Orders   POCT URINALYSIS DIP (CLINITEK) (Completed)   Urine Culture (Completed)       Meds ordered this encounter  Medications  . sulfamethoxazole-trimethoprim (BACTRIM DS) 800-160 MG tablet    Sig: Take 1 tablet by mouth 2 (two) times daily.    Dispense:  10 tablet    Refill:  0    Order Specific Question:   Supervising Provider    Answer:   Forrest Moron [id number]   PLAN  UA dip suspicious for UTI  Will treat with bactrim po bid for three days  Return if worsening or failing to improve  Culture sent  Patient encouraged to call clinic with any questions, comments, or concerns.  Maximiano Coss, NP

## 2019-08-08 ENCOUNTER — Ambulatory Visit: Payer: Medicare Other | Admitting: Cardiovascular Disease

## 2019-08-26 ENCOUNTER — Ambulatory Visit (INDEPENDENT_AMBULATORY_CARE_PROVIDER_SITE_OTHER): Payer: Medicare Other | Admitting: Family Medicine

## 2019-08-26 ENCOUNTER — Other Ambulatory Visit: Payer: Self-pay

## 2019-08-26 ENCOUNTER — Encounter: Payer: Self-pay | Admitting: Family Medicine

## 2019-08-26 VITALS — BP 143/87 | HR 68 | Temp 98.0°F | Ht 66.0 in | Wt 255.8 lb

## 2019-08-26 DIAGNOSIS — R7989 Other specified abnormal findings of blood chemistry: Secondary | ICD-10-CM

## 2019-08-26 DIAGNOSIS — I10 Essential (primary) hypertension: Secondary | ICD-10-CM | POA: Diagnosis not present

## 2019-08-26 DIAGNOSIS — E785 Hyperlipidemia, unspecified: Secondary | ICD-10-CM | POA: Diagnosis not present

## 2019-08-26 DIAGNOSIS — R350 Frequency of micturition: Secondary | ICD-10-CM | POA: Diagnosis not present

## 2019-08-26 LAB — POCT URINALYSIS DIP (MANUAL ENTRY)
Bilirubin, UA: NEGATIVE
Blood, UA: NEGATIVE
Glucose, UA: NEGATIVE mg/dL
Ketones, POC UA: NEGATIVE mg/dL
Nitrite, UA: NEGATIVE
Protein Ur, POC: NEGATIVE mg/dL
Spec Grav, UA: 1.025 (ref 1.010–1.025)
Urobilinogen, UA: 0.2 E.U./dL
pH, UA: 7 (ref 5.0–8.0)

## 2019-08-26 MED ORDER — LEVOTHYROXINE SODIUM 50 MCG PO TABS: 50.0000 ug | ORAL_TABLET | ORAL | 1 refills | Status: DC

## 2019-08-26 MED ORDER — HYDROCHLOROTHIAZIDE 25 MG PO TABS: 25.0000 mg | ORAL_TABLET | Freq: Every day | ORAL | 3 refills | Status: DC

## 2019-08-26 NOTE — Patient Instructions (Signed)
     If you have lab work done today you will be contacted with your lab results within the next 2 weeks.  If you have not heard from us then please contact us. The fastest way to get your results is to register for My Chart.   IF you received an x-ray today, you will receive an invoice from [facility]. Please contact [facility] at [phone] with questions or concerns regarding your invoice.   IF you received labwork today, you will receive an invoice from [facility]. Please contact [facility] at [phone] with questions or concerns regarding your invoice.   Our billing staff will not be able to assist you with questions regarding bills from these companies.  You will be contacted with the lab results as soon as they are available. The fastest way to get your results is to activate your My Chart account. Instructions are located on the last page of this paperwork. If you have not heard from us regarding the results in 2 weeks, please contact this office.        If you have lab work done today you will be contacted with your lab results within the next 2 weeks.  If you have not heard from us then please contact us. The fastest way to get your results is to register for My Chart.   IF you received an x-ray today, you will receive an invoice from [facility]. Please contact [facility] at [phone] with questions or concerns regarding your invoice.   IF you received labwork today, you will receive an invoice from [facility]. Please contact [facility] at [phone] with questions or concerns regarding your invoice.   Our billing staff will not be able to assist you with questions regarding bills from these companies.  You will be contacted with the lab results as soon as they are available. The fastest way to get your results is to activate your My Chart account. Instructions are located on the last page of this paperwork. If you have not heard from us  regarding the results in 2 weeks, please contact this office.     

## 2019-08-26 NOTE — Progress Notes (Signed)
5/21/20211:31 PM  Janet Leblanc 16-Feb-1951, 70 y.o., female [id number]  Chief Complaint  Patient presents with  . Follow-up    uti sx and tsh    HPI:   Patient is a 69 y.o. female with past medical history significant for HTN and hypothyrodism who presents today with several concerns  Previous PCP Dr Nolon Rod  Treated for UTI in march with bactrim Ur cx 10-25,000 cfu mixed GU flora Reports h/o recurrent UTI, has been evaluated by urology Does not like to drink water Having intermittent dysuria  Has not taken BP meds today Takes HCTZ with OTC potassium Normal echo march 2021  BP Readings from Last 3 Encounters:  08/26/19 (!) 143/87  07/06/19 125/78  06/20/19 136/68   Lab Results  Component Value Date   CREATININE 1.23 (H) 05/18/2019   CREATININE 1.17 (H) 07/20/2018   CREATININE 1.02 (H) 03/19/2017    Lab Results  Component Value Date   CREATININE 1.23 (H) 05/18/2019   BUN 19 05/18/2019   NA 139 05/18/2019   K 3.5 05/18/2019   CL 99 05/18/2019   CO2 26 05/18/2019   Started levothyroxine 34m qod at last ov with previous PCP Tolerating well, struggling with taking  Lab Results  Component Value Date   TSH 4.940 (H) 05/18/2019   Had colonoscopy march 2021, mild diverticula, no polyps GI started on omeprazole qod, reflux sign better H/o gastric bypass surgery  [facility] 07/06/2019 05/18/2019 05/10/2019  Decreased Interest 0 0 0  Down, Depressed, Hopeless 0 0 0  PHQ - 2 Score 0 0 0    Fall Risk  07/06/2019 05/18/2019 05/10/2019 12/25/2017 03/19/2017  Falls in the past year? 0 0 0 No No  Number falls in past yr: - 0 0 - -  Injury with Fall? - 0 0 - -  Follow up Falls evaluation completed Falls evaluation completed Falls evaluation completed;Education provided - -     Allergies  Allergen Reactions  . Penicillins Swelling    Prior to Admission medications   Medication Sig Start Date End Date Taking? Authorizing Provider  aspirin 81 MG  tablet Take 81 mg by mouth daily.   Yes [provider]  hydrochlorothiazide (HYDRODIURIL) 25 MG tablet Take 1 tablet (25 mg total) by mouth daily. 05/18/19  Yes Stallings, Zoe A, MD  ipratropium (ATROVENT) 0.03 % nasal spray Place 2 sprays into both nostrils 2 (two) times daily. 12/25/17  Yes SRutherford Guys MD  levothyroxine (SYNTHROID) 50 MCG tablet Take 1 tablet (50 mcg total) by mouth every other day. 06/06/19  Yes SForrest Moron MD  Multiple Vitamin (MULTI VITAMIN DAILY PO) Take 1 tablet by mouth.   Yes [provider]  omeprazole (PRILOSEC) 20 MG capsule Take 1 capsule (20 mg total) by mouth every other day. 05/30/19  Yes GWillia Craze NP  POTASSIUM PO Take 1 tablet by mouth as needed.   Yes [provider]  sulfamethoxazole-trimethoprim (BACTRIM DS) 800-160 MG tablet Take 1 tablet by mouth 2 (two) times daily. 07/06/19  Yes MMaximiano Coss NP    Past Medical History:  Diagnosis Date  . Allergy    seasonal  . Arthritis   . Hypertension   . Thyroid disease   . Vertigo     Past Surgical History:  Procedure Laterality Date  . CHOLECYSTECTOMY    . COLONOSCOPY  06/20/2019  . FOOT SURGERY    . GASTRIC BYPASS     15 years ago at  least  . TUBAL LIGATION      Social History   Tobacco Use  . Smoking status: Former Smoker    Years: 15.00    Types: Cigarettes    Quit date: 1990    Years since quitting: 31.4  . Smokeless tobacco: Never Used  Substance Use Topics  . Alcohol use: No    Alcohol/week: 0.0 standard drinks    Family History  Problem Relation Age of Onset  . Heart disease Father   . Hypertension Father   . Heart disease Brother   . Stroke Brother   . Throat cancer Brother   . Heart disease Paternal Grandmother   . Cancer Paternal Grandfather   . Migraines Neg Hx   . Colon polyps Neg Hx   . Colon cancer Neg Hx   . Esophageal cancer Neg Hx   . Rectal cancer Neg Hx   . Stomach cancer Neg Hx     Review of Systems    Constitutional: Negative for chills and fever.  Respiratory: Negative for cough and shortness of breath.   Cardiovascular: Negative for chest pain, palpitations and leg swelling.  Gastrointestinal: Negative for abdominal pain, nausea and vomiting.     OBJECTIVE:  Today's Vitals   08/26/19 1330  BP: (!) 143/87  Pulse: 68  Temp: 98 F (36.7 C)  SpO2: 97%  Weight: 255 lb 12.8 oz (116 kg)  Height: 5' 6"  (1.676 m)   Body mass index is 41.29 kg/m.   Physical Exam Vitals and nursing note reviewed.  Constitutional:      Appearance: She is well-developed.  HENT:     Head: Normocephalic and atraumatic.     Mouth/Throat:     Pharynx: No oropharyngeal exudate.  Eyes:     General: No scleral icterus.    Conjunctiva/sclera: Conjunctivae normal.     Pupils: Pupils are equal, round, and reactive to light.  Cardiovascular:     Rate and Rhythm: Normal rate and regular rhythm.     Heart sounds: Normal heart sounds. No murmur. No friction rub. No gallop.   Pulmonary:     Effort: Pulmonary effort is normal.     Breath sounds: Normal breath sounds. No wheezing or rales.  Musculoskeletal:     Cervical back: Neck supple.  Skin:    General: Skin is warm and dry.  Neurological:     Mental Status: She is alert and oriented to person, place, and time.     Results for orders placed or performed in visit on 08/26/19 (from the past 24 hour(s))  POCT urinalysis dipstick     Status: None   Collection Time: 08/26/19  1:35 PM  Result Value Ref Range   Color, UA yellow yellow   Clarity, UA clear clear   Glucose, UA negative negative mg/dL   Bilirubin, UA negative negative   Ketones, POC UA negative negative mg/dL   Spec Grav, UA 1.025 1.010 - 1.025   Blood, UA negative negative   pH, UA 7.0 5.0 - 8.0   Protein Ur, POC negative negative mg/dL   Urobilinogen, UA 0.2 0.2 or 1.0 E.U./dL   Nitrite, UA Negative Negative   Leukocytes, UA Negative Negative    No results  found.   ASSESSMENT and PLAN  1. Urinary frequency - POCT urinalysis dipstick - normal.   2. Abnormal TSH Checking labs today, medications will be adjusted as needed.  - TSH  3. Benign essential HTN Not at goal as has not taken meds today,  previously well controlled. Cont with current regime.  - hydrochlorothiazide (HYDRODIURIL) 25 MG tablet; Take 1 tablet (25 mg total) by mouth daily.  4. Dyslipidemia Checking labs today, medications will be started as needed.  - Lipid panel - CMP14+EGFR  Other orders - levothyroxine (SYNTHROID) 50 MCG tablet; Take 1 tablet (50 mcg total) by mouth every other day.  Return in about 6 months (around 02/26/2020).    Rutherford Guys, MD [facility] [place], [place] [address] Ph.  [phone] Fax [phone]

## 2019-08-27 LAB — CMP14+EGFR
ALT: 17 IU/L (ref 0–32)
AST: 20 IU/L (ref 0–40)
Albumin/Globulin Ratio: 1.6 (ref 1.2–2.2)
Albumin: 4 g/dL (ref 3.8–4.8)
Alkaline Phosphatase: 114 IU/L (ref 48–121)
BUN/Creatinine Ratio: 15 (ref 12–28)
BUN: 13 mg/dL (ref 8–27)
Bilirubin Total: <0.2 mg/dL (ref 0.0–1.2)
CO2: 28 mmol/L (ref 20–29)
Calcium: 9.5 mg/dL (ref 8.7–10.3)
Chloride: 101 mmol/L (ref 96–106)
Creatinine, Ser: 0.89 mg/dL (ref 0.57–1.00)
GFR calc Af Amer: 77 mL/min/{1.73_m2} (ref >59)
GFR calc non Af Amer: 67 mL/min/{1.73_m2} (ref >59)
Globulin, Total: 2.5 g/dL (ref 1.5–4.5)
Glucose: 98 mg/dL (ref 65–99)
Potassium: 3.9 mmol/L (ref 3.5–5.2)
Sodium: 140 mmol/L (ref 134–144)
Total Protein: 6.5 g/dL (ref 6.0–8.5)

## 2019-08-27 LAB — LIPID PANEL
Chol/HDL Ratio: 4.9 ratio — ABNORMAL HIGH (ref 0.0–4.4)
Cholesterol, Total: 178 mg/dL (ref 100–199)
HDL: 36 mg/dL — ABNORMAL LOW (ref >39)
LDL Chol Calc (NIH): 108 mg/dL — ABNORMAL HIGH (ref 0–99)
Triglycerides: 197 mg/dL — ABNORMAL HIGH (ref 0–149)
VLDL Cholesterol Cal: 34 mg/dL (ref 5–40)

## 2019-08-27 LAB — TSH: TSH: 4.3 u[IU]/mL (ref 0.450–4.500)

## 2019-08-28 LAB — HM MAMMOGRAPHY: HM Mammogram: ABNORMAL — AB (ref 0–4)

## 2019-08-30 DIAGNOSIS — Z1231 Encounter for screening mammogram for malignant neoplasm of breast: Secondary | ICD-10-CM | POA: Diagnosis not present

## 2019-08-30 LAB — HM MAMMOGRAPHY: HM Mammogram: ABNORMAL — AB (ref 0–4)

## 2019-09-06 ENCOUNTER — Encounter: Payer: Self-pay | Admitting: Family Medicine

## 2019-09-06 DIAGNOSIS — N6313 Unspecified lump in the right breast, lower outer quadrant: Secondary | ICD-10-CM | POA: Diagnosis not present

## 2019-09-08 ENCOUNTER — Encounter: Payer: Self-pay | Admitting: Radiology

## 2019-09-09 ENCOUNTER — Telehealth: Payer: Self-pay

## 2019-09-13 NOTE — Telephone Encounter (Signed)
No additional notes required  

## 2019-09-18 ENCOUNTER — Encounter (HOSPITAL_COMMUNITY): Payer: Self-pay

## 2019-09-18 ENCOUNTER — Other Ambulatory Visit: Payer: Self-pay

## 2019-09-18 ENCOUNTER — Ambulatory Visit (HOSPITAL_COMMUNITY)
Admission: EM | Admit: 2019-09-18 | Discharge: 2019-09-18 | Disposition: A | Discharge status: Home / Self Care | Payer: Medicare Other | Attending: Emergency Medicine | Admitting: Emergency Medicine

## 2019-09-18 DIAGNOSIS — R350 Frequency of micturition: Secondary | ICD-10-CM | POA: Insufficient documentation

## 2019-09-18 DIAGNOSIS — R109 Unspecified abdominal pain: Secondary | ICD-10-CM | POA: Insufficient documentation

## 2019-09-18 MED ORDER — SULFAMETHOXAZOLE-TRIMETHOPRIM 800-160 MG PO TABS: 1.0000 | ORAL_TABLET | Freq: Two times a day (BID) | ORAL | 0 refills | Status: AC

## 2019-09-18 NOTE — ED Provider Notes (Signed)
[facility]    CSN: [id number] Arrival date & time: 09/18/19  1426      History   Chief Complaint Chief Complaint  Patient presents with  . Flank Pain    HPI Janet Leblanc is a 69 y.o. female.   Patient is a 69 year old female presents today with right flank pain, urinary frequency x3 to 4 days.  Symptoms have been constant.  She has been using AZO at home.  Symptoms have been constant worsening since Thursday.  History of urinary tract infections in the past.  Has been drinking lots of water.  Mild urinary retention. No fevers, nausea, vomiting  ROS per HPI      Past Medical History:  Diagnosis Date  . Allergy    seasonal  . Arthritis   . Hypertension   . Thyroid disease   . Vertigo     Patient Active Problem List   Diagnosis Date Noted  . Benign essential HTN 03/24/2016  . 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 Tinnitus of both ears 06/08/2014    Past Surgical History:  Procedure Laterality Date  . CHOLECYSTECTOMY    . COLONOSCOPY  06/20/2019  . FOOT SURGERY    . GASTRIC BYPASS     15 years ago at least  . TUBAL LIGATION      OB History   No obstetric history on file.      Home Medications    Prior to Admission medications   Medication Sig Start Date End Date Taking? Authorizing Provider  aspirin 81 MG tablet Take 81 mg by mouth daily.    [provider]  hydrochlorothiazide (HYDRODIURIL) 25 MG tablet Take 1 tablet (25 mg total) by mouth daily. 08/26/19   Rutherford Guys, MD  ipratropium (ATROVENT) 0.03 % nasal spray Place 2 sprays into both nostrils 2 (two) times daily. 12/25/17   Rutherford Guys, MD  levothyroxine (SYNTHROID) 50 MCG tablet Take 1 tablet (50 mcg total) by mouth every other day. 08/26/19   Rutherford Guys, MD  Multiple Vitamin (MULTI VITAMIN DAILY PO) Take 1 tablet by mouth.    [provider]  omeprazole (PRILOSEC) 20 MG capsule Take 1 capsule (20 mg total) by mouth every  other day. 05/30/19   Willia Craze, NP  POTASSIUM PO Take 1 tablet by mouth as needed.    [provider]  sulfamethoxazole-trimethoprim (BACTRIM DS) 800-160 MG tablet Take 1 tablet by mouth 2 (two) times daily for 7 days. 09/18/19 09/25/19  Orvan July, NP    Family History Family History  Problem Relation Age of Onset  . Heart disease Father   . Hypertension Father   . Heart disease Brother   . Stroke Brother   . Throat cancer Brother   . Heart disease Paternal Grandmother   . Cancer Paternal Grandfather   . Migraines Neg Hx   . Colon polyps Neg Hx   . Colon cancer Neg Hx   . Esophageal cancer Neg Hx   . Rectal cancer Neg Hx   . Stomach cancer Neg Hx     Social History Social History   Tobacco Use  . Smoking status: Former Smoker    Years: 15.00    Types: Cigarettes    Quit date: 1990    Years since quitting: 31.4  . Smokeless tobacco: Never Used  Vaping Use  . Vaping Use: Never used  Substance Use Topics  . Alcohol use: No  Alcohol/week: 0.0 standard drinks  . Drug use: No     Allergies   Penicillins   Review of Systems Review of Systems   Physical Exam Triage Vital Signs ED Triage Vitals [09/18/19 1452]  Enc Vitals Group     BP 138/81     Pulse Rate 85     Resp 16     Temp (!) 97 F (36.1 C)     Temp src      SpO2 94 %     Weight      Height      Head Circumference      Peak Flow      Pain Score 7     Pain Loc      Pain Edu?      Excl. in [place]?    No data found.  Updated Vital Signs BP 138/81   Pulse 85   Temp (!) 97 F (36.1 C)   Resp 16   SpO2 94%   Visual Acuity Right Eye Distance:   Left Eye Distance:   Bilateral Distance:    Right Eye Near:   Left Eye Near:    Bilateral Near:     Physical Exam Vitals and nursing note reviewed.  Constitutional:      General: She is not in acute distress.    Appearance: Normal appearance. She is not ill-appearing, toxic-appearing or diaphoretic.  HENT:     Head:  Normocephalic.     Nose: Nose normal.  Eyes:     Conjunctiva/sclera: Conjunctivae normal.  Pulmonary:     Effort: Pulmonary effort is normal.  Abdominal:     Tenderness: There is left CVA tenderness.  Musculoskeletal:        General: Normal range of motion.     Cervical back: Normal range of motion.  Skin:    General: Skin is warm and dry.     Findings: No rash.  Neurological:     Mental Status: She is alert.  Psychiatric:        Mood and Affect: Mood normal.      UC Treatments / Results  Labs (all labs ordered are listed, but only abnormal results are displayed) Labs Reviewed - No data to display  EKG   Radiology No results found.  Procedures Procedures (including critical care time)  Medications Ordered in UC Medications - No data to display  Initial Impression / Assessment and Plan / UC Course  I have reviewed the triage vital signs and the nursing notes.  Pertinent labs & imaging results that were available during my care of the patient were reviewed by me and considered in my medical decision making (see chart for details).     Flank pain urinary frequency Urine with large leuks, large blood Sending for culture. Treat with Bactrim and recommended push fluids. Follow up as needed for continued or worsening symptoms  Final Clinical Impressions(s) / UC Diagnoses   Final diagnoses:  Flank pain  Urinary frequency     Discharge Instructions     Treating you for a kidney infection Take the medication as prescribed.  Keep drinking plenty of fluids.  We will call you with any changes in culture results.    ED Prescriptions    Medication Sig Dispense Auth. Provider   sulfamethoxazole-trimethoprim (BACTRIM DS) 800-160 MG tablet Take 1 tablet by mouth 2 (two) times daily for 7 days. 14 tablet Zenda Herskowitz A, NP     PDMP not reviewed this encounter.  Orvan July, NP 09/18/19 1547

## 2019-09-18 NOTE — Discharge Instructions (Signed)
Treating you for a kidney infection Take the medication as prescribed.  Keep drinking plenty of fluids.  We will call you with any changes in culture results.

## 2019-09-18 NOTE — ED Triage Notes (Signed)
Pt c/o right flank pain, urinary frequency x 3-4 days. Has been taking AZO at home.

## 2019-09-18 NOTE — ED Notes (Signed)
I ran the Urinalysis test but due to quality of urine, the clintek machine can't test it.

## 2019-09-19 ENCOUNTER — Encounter: Payer: Self-pay | Admitting: Family Medicine

## 2019-09-19 ENCOUNTER — Other Ambulatory Visit: Payer: Self-pay | Admitting: Radiology

## 2019-09-19 DIAGNOSIS — N6313 Unspecified lump in the right breast, lower outer quadrant: Secondary | ICD-10-CM | POA: Diagnosis not present

## 2019-09-19 DIAGNOSIS — N6031 Fibrosclerosis of right breast: Secondary | ICD-10-CM | POA: Diagnosis not present

## 2019-09-19 LAB — URINE CULTURE: Culture: <10000 — AB

## 2019-11-08 ENCOUNTER — Other Ambulatory Visit: Payer: Self-pay

## 2019-11-08 ENCOUNTER — Ambulatory Visit (HOSPITAL_COMMUNITY)
Admission: EM | Admit: 2019-11-08 | Discharge: 2019-11-08 | Disposition: A | Discharge status: Home / Self Care | Payer: Medicare Other | Attending: Family Medicine | Admitting: Family Medicine

## 2019-11-08 ENCOUNTER — Encounter (HOSPITAL_COMMUNITY): Payer: Self-pay

## 2019-11-08 DIAGNOSIS — R319 Hematuria, unspecified: Secondary | ICD-10-CM | POA: Insufficient documentation

## 2019-11-08 LAB — POCT URINALYSIS DIP (DEVICE)
Bilirubin Urine: NEGATIVE
Glucose, UA: NEGATIVE mg/dL
Ketones, ur: NEGATIVE mg/dL
Nitrite: NEGATIVE
Protein, ur: 100 mg/dL — AB
Specific Gravity, Urine: 1.02 (ref 1.005–1.030)
Urobilinogen, UA: 0.2 mg/dL (ref 0.0–1.0)
pH: 6 (ref 5.0–8.0)

## 2019-11-08 MED ORDER — CEPHALEXIN 500 MG PO CAPS
500.0000 mg | ORAL_CAPSULE | Freq: Two times a day (BID) | ORAL | 0 refills | Status: AC
Start: 2019-11-08 — End: 2019-11-15

## 2019-11-08 NOTE — ED Triage Notes (Signed)
Pt presents with complaints of blood in her urine. Reports she recently had a uti about 6 weeks ago. Reports dysuria as well that started today.

## 2019-11-08 NOTE — Discharge Instructions (Addendum)
Treating you for urinary tract infection Sending urine for culture.  Drink plenty of water.  Follow up as needed for continued or worsening symptoms

## 2019-11-09 LAB — URINE CULTURE

## 2019-11-10 NOTE — ED Provider Notes (Signed)
[place]    CSN: [id number] Arrival date & time: 11/08/19  1251      History   Chief Complaint Chief Complaint  Patient presents with  . Hematuria    HPI Janet Leblanc is a 69 y.o. female.   Patient is a 69 year old female presents today with hematuria.  Reporting blood clots in her urine.  History of recurrent UTIs.  She also has mild dysuria.  No abdominal pain, back pain, flank pain, nausea, vomiting or fevers.     Past Medical History:  Diagnosis Date  . Allergy    seasonal  . Arthritis   . Hypertension   . Thyroid disease   . Vertigo     Patient Active Problem List   Diagnosis Date Noted  . Benign essential HTN 03/24/2016  . 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 Tinnitus of both ears 06/08/2014    Past Surgical History:  Procedure Laterality Date  . CHOLECYSTECTOMY    . COLONOSCOPY  06/20/2019  . FOOT SURGERY    . GASTRIC BYPASS     15 years ago at least  . TUBAL LIGATION      OB History   No obstetric history on file.      Home Medications    Prior to Admission medications   Medication Sig Start Date End Date Taking? Authorizing Provider  aspirin 81 MG tablet Take 81 mg by mouth daily.    [provider]  cephALEXin (KEFLEX) 500 MG capsule Take 1 capsule (500 mg total) by mouth 2 (two) times daily for 7 days. 11/08/19 11/15/19  Loura Halt A, NP  hydrochlorothiazide (HYDRODIURIL) 25 MG tablet Take 1 tablet (25 mg total) by mouth daily. 08/26/19   Rutherford Guys, MD  ipratropium (ATROVENT) 0.03 % nasal spray Place 2 sprays into both nostrils 2 (two) times daily. 12/25/17   Rutherford Guys, MD  levothyroxine (SYNTHROID) 50 MCG tablet Take 1 tablet (50 mcg total) by mouth every other day. 08/26/19   Rutherford Guys, MD  Multiple Vitamin (MULTI VITAMIN DAILY PO) Take 1 tablet by mouth.    [provider]  omeprazole (PRILOSEC) 20 MG capsule Take 1 capsule (20 mg total) by mouth every other day.  05/30/19   Willia Craze, NP  POTASSIUM PO Take 1 tablet by mouth as needed.    [provider]    Family History Family History  Problem Relation Age of Onset  . Heart disease Father   . Hypertension Father   . Heart disease Brother   . Stroke Brother   . Throat cancer Brother   . Heart disease Paternal Grandmother   . Cancer Paternal Grandfather   . Migraines Neg Hx   . Colon polyps Neg Hx   . Colon cancer Neg Hx   . Esophageal cancer Neg Hx   . Rectal cancer Neg Hx   . Stomach cancer Neg Hx     Social History Social History   Tobacco Use  . Smoking status: Former Smoker    Years: 15.00    Types: Cigarettes    Quit date: 1990    Years since quitting: 31.6  . Smokeless tobacco: Never Used  Vaping Use  . Vaping Use: Never used  Substance Use Topics  . Alcohol use: No    Alcohol/week: 0.0 standard drinks  . Drug use: No     Allergies   Penicillins   Review of Systems Review of Systems  Physical Exam Triage Vital Signs ED Triage Vitals [11/08/19 1350]  Enc Vitals Group     BP (!) 137/91     Pulse Rate 90     Resp 20     Temp 98 F (36.7 C)     Temp src      SpO2 96 %     Weight      Height      Head Circumference      Peak Flow      Pain Score 0     Pain Loc      Pain Edu?      Excl. in [place]?    No data found.  Updated Vital Signs BP (!) 137/91   Pulse 90   Temp 98 F (36.7 C)   Resp 20   SpO2 96%   Visual Acuity Right Eye Distance:   Left Eye Distance:   Bilateral Distance:    Right Eye Near:   Left Eye Near:    Bilateral Near:     Physical Exam Vitals and nursing note reviewed.  Constitutional:      General: She is not in acute distress.    Appearance: Normal appearance. She is not ill-appearing, toxic-appearing or diaphoretic.  HENT:     Head: Normocephalic.     Nose: Nose normal.  Eyes:     Conjunctiva/sclera: Conjunctivae normal.  Pulmonary:     Effort: Pulmonary effort is normal.  Musculoskeletal:         General: Normal range of motion.     Cervical back: Normal range of motion.  Skin:    General: Skin is warm and dry.     Findings: No rash.  Neurological:     Mental Status: She is alert.  Psychiatric:        Mood and Affect: Mood normal.      UC Treatments / Results  Labs (all labs ordered are listed, but only abnormal results are displayed) Labs Reviewed  URINE CULTURE - Abnormal; Notable for the following components:      Result Value   Culture MULTIPLE SPECIES PRESENT, SUGGEST RECOLLECTION (*)    All other components within normal limits  POCT URINALYSIS DIP (DEVICE) - Abnormal; Notable for the following components:   Hgb urine dipstick LARGE (*)    Protein, ur 100 (*)    Leukocytes,Ua SMALL (*)    All other components within normal limits    EKG   Radiology No results found.  Procedures Procedures (including critical care time)  Medications Ordered in UC Medications - No data to display  Initial Impression / Assessment and Plan / UC Course  I have reviewed the triage vital signs and the nursing notes.  Pertinent labs & imaging results that were available during my care of the patient were reviewed by me and considered in my medical decision making (see chart for details).     Hematuria urine with large blood and small leuks We will send for culture.  Patient requesting to be started on antibiotics until culture comes back.  We will go ahead and start on Keflex at this time.  Make sure she drinks plenty of fluids. Patient has had multiple cultures previously with no specific infection.  May be some other underlying issue.  Recommend she follow-up with urology. Final Clinical Impressions(s) / UC Diagnoses   Final diagnoses:  Hematuria, unspecified type     Discharge Instructions     Treating you for urinary tract infection Sending urine  for culture.  Drink plenty of water.  Follow up as needed for continued or worsening symptoms     ED  Prescriptions    Medication Sig Dispense Auth. Provider   cephALEXin (KEFLEX) 500 MG capsule Take 1 capsule (500 mg total) by mouth 2 (two) times daily for 7 days. 14 capsule Seddrick Flax A, NP     PDMP not reviewed this encounter.   Loura Halt A, NP 11/10/19 1002

## 2019-12-28 ENCOUNTER — Ambulatory Visit: Payer: Medicare Other | Admitting: Podiatry

## 2020-01-05 ENCOUNTER — Ambulatory Visit (INDEPENDENT_AMBULATORY_CARE_PROVIDER_SITE_OTHER): Payer: Medicare Other | Admitting: Podiatry

## 2020-01-05 ENCOUNTER — Encounter: Payer: Self-pay | Admitting: Podiatry

## 2020-01-05 ENCOUNTER — Other Ambulatory Visit: Payer: Self-pay

## 2020-01-05 DIAGNOSIS — Q828 Other specified congenital malformations of skin: Secondary | ICD-10-CM

## 2020-01-05 NOTE — Progress Notes (Signed)
Subjective:   Patient ID: Janet Leblanc, female   DOB: 69 y.o.   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

## 2020-02-17 ENCOUNTER — Ambulatory Visit (HOSPITAL_COMMUNITY)
Admission: EM | Admit: 2020-02-17 | Discharge: 2020-02-17 | Disposition: A | Discharge status: Home / Self Care | Payer: Medicare Other | Attending: Family Medicine | Admitting: Family Medicine

## 2020-02-17 ENCOUNTER — Encounter (HOSPITAL_COMMUNITY): Payer: Self-pay | Admitting: Emergency Medicine

## 2020-02-17 ENCOUNTER — Other Ambulatory Visit: Payer: Self-pay

## 2020-02-17 DIAGNOSIS — J209 Acute bronchitis, unspecified: Secondary | ICD-10-CM | POA: Diagnosis not present

## 2020-02-17 MED ORDER — PREDNISONE 20 MG PO TABS
40.0000 mg | ORAL_TABLET | Freq: Every day | ORAL | 0 refills | Status: DC
Start: 2020-02-17 — End: 2020-02-27

## 2020-02-17 MED ORDER — ALBUTEROL SULFATE HFA 108 (90 BASE) MCG/ACT IN AERS
2.0000 | INHALATION_SPRAY | Freq: Four times a day (QID) | RESPIRATORY_TRACT | Status: DC
  Administered 2020-02-17: 2 via RESPIRATORY_TRACT

## 2020-02-17 MED ORDER — DOXYCYCLINE HYCLATE 100 MG PO CAPS
100.0000 mg | ORAL_CAPSULE | Freq: Two times a day (BID) | ORAL | 0 refills | Status: DC
Start: 2020-02-17 — End: 2020-02-27

## 2020-02-17 MED ORDER — PROMETHAZINE-DM 6.25-15 MG/5ML PO SYRP
5.0000 mL | ORAL_SOLUTION | Freq: Four times a day (QID) | ORAL | 0 refills | Status: DC | PRN
Start: 2020-02-17 — End: 2020-02-27

## 2020-02-17 MED ORDER — ALBUTEROL SULFATE HFA 108 (90 BASE) MCG/ACT IN AERS
INHALATION_SPRAY | RESPIRATORY_TRACT | Status: AC
  Filled 2020-02-17: qty 6.7

## 2020-02-17 NOTE — ED Triage Notes (Signed)
Pt c/o cough, sore throat and shortness of breath onset Sunday night. Pt states she works at [employer] and was concerned for covid. Her covid test was negative. She states today around 1300 she felt like something was stuck in her throat and she couldn't breath or catch her breath. Pt states she has used an inhaler in the past.

## 2020-02-18 NOTE — ED Provider Notes (Signed)
[place]    CSN: [id number] Arrival date & time: 02/17/20  1448      History   Chief Complaint Chief Complaint  Patient presents with  . Cough  . Sore Throat  . Shortness of Breath    HPI Janet Leblanc is a 69 y.o. female.   HPI  Patient presents today with complaint of cough, sore throat and shortness of breath x5 days. She was recently tested for Covid and was negative. She endorses some mild shortness of breath which worsens with cyclic type coughing. She also senses developed a worsening sore throat which which is painful with swallowing and this is also causing to have difficulty catching her breath. She has been treated with an albuterol inhaler in the past however denies any chronic respiratory disease.  She is currently not have any chest tightness or wheezing. Past Medical History:  Diagnosis Date  . Allergy    seasonal  . Arthritis   . Hypertension   . Thyroid disease   . Vertigo     Patient Active Problem List   Diagnosis Date Noted  . Benign essential HTN 03/24/2016  . 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 Tinnitus of both ears 06/08/2014    Past Surgical History:  Procedure Laterality Date  . CHOLECYSTECTOMY    . COLONOSCOPY  06/20/2019  . FOOT SURGERY    . GASTRIC BYPASS     15 years ago at least  . TUBAL LIGATION      OB History   No obstetric history on file.      Home Medications    Prior to Admission medications   Medication Sig Start Date End Date Taking? Authorizing Provider  aspirin 81 MG tablet Take 81 mg by mouth daily.   Yes [provider]  hydrochlorothiazide (HYDRODIURIL) 25 MG tablet Take 1 tablet (25 mg total) by mouth daily. 08/26/19  Yes Rutherford Guys, MD  levothyroxine (SYNTHROID) 50 MCG tablet Take 1 tablet (50 mcg total) by mouth every other day. 08/26/19  Yes Rutherford Guys, MD  Multiple Vitamin (MULTI VITAMIN DAILY PO) Take 1 tablet by mouth.   Yes [provider]  omeprazole (PRILOSEC) 20 MG capsule Take 1 capsule (20 mg total) by mouth every other day. 05/30/19  Yes Willia Craze, NP  POTASSIUM PO Take 1 tablet by mouth as needed.   Yes [provider]  doxycycline (VIBRAMYCIN) 100 MG capsule Take 1 capsule (100 mg total) by mouth 2 (two) times daily. 02/17/20   Scot Jun, FNP  ipratropium (ATROVENT) 0.03 % nasal spray Place 2 sprays into both nostrils 2 (two) times daily. 12/25/17   Rutherford Guys, MD  predniSONE (DELTASONE) 20 MG tablet Take 2 tablets (40 mg total) by mouth daily with breakfast. 02/17/20   Scot Jun, FNP  promethazine-dextromethorphan (PROMETHAZINE-DM) 6.25-15 MG/5ML syrup Take 5 mLs by mouth 4 (four) times daily as needed for cough. 02/17/20   Scot Jun, FNP    Family History Family History  Problem Relation Age of Onset  . Heart disease Father   . Hypertension Father   . Heart disease Brother   . Stroke Brother   . Throat cancer Brother   . Heart disease Paternal Grandmother   . Cancer Paternal Grandfather   . Migraines Neg Hx   . Colon polyps Neg Hx   . Colon cancer Neg Hx   . Esophageal cancer Neg Hx   . Rectal  cancer Neg Hx   . Stomach cancer Neg Hx     Social History Social History   Tobacco Use  . Smoking status: Former Smoker    Years: 15.00    Types: Cigarettes    Quit date: 1990    Years since quitting: 31.8  . Smokeless tobacco: Never Used  Vaping Use  . Vaping Use: Never used  Substance Use Topics  . Alcohol use: No    Alcohol/week: 0.0 standard drinks  . Drug use: No     Allergies   Penicillins   Review of Systems Review of Systems Pertinent negatives listed in HPI Physical Exam Triage Vital Signs ED Triage Vitals  Enc Vitals Group     BP 02/17/20 1630 123/76     Pulse Rate 02/17/20 1630 88     Resp 02/17/20 1630 (!) 23     Temp 02/17/20 1630 (!) 97.3 F (36.3 C)     Temp Source 02/17/20 1630 Oral     SpO2 02/17/20 1630 100 %       Weight --      Height --      Head Circumference --      Peak Flow --      Pain Score 02/17/20 1632 0     Pain Loc --      Pain Edu? --      Excl. in [place]? --    No data found.  Updated Vital Signs BP 123/76 (BP Location: Right Arm)   Pulse 88   Temp (!) 97.3 F (36.3 C) (Oral)   Resp (!) 23   SpO2 100%   Visual Acuity Right Eye Distance:   Left Eye Distance:   Bilateral Distance:    Right Eye Near:   Left Eye Near:    Bilateral Near:     Physical Exam General appearance: alert, well developed, acutely ill-appearing Head: Normocephalic, without obvious abnormality, atraumatic Respiratory: Tachypneic, coarse lung sounds, no rhonchi, crackles or wheezing Heart: Rate and rhythm normal. No gallop or murmurs noted on exam  Abdomen: BS +, no distention, no rebound tenderness, or no mass Extremities: No gross deformities Skin: Skin color, texture, turgor normal. No rashes seen  Psych: Appropriate mood and affect. UC Treatments / Results  Labs (all labs ordered are listed, but only abnormal results are displayed) Labs Reviewed - No data to display  EKG   Radiology No results found.  Procedures Procedures (including critical care time)  Medications Ordered in UC Medications - No data to display  Initial Impression / Assessment and Plan / UC Course  I have reviewed the triage vital signs and the nursing notes.  Pertinent labs & imaging results that were available during my care of the patient were reviewed by me and considered in my medical decision making (see chart for details).    Treating based on exam for acute bronchitis.  Patient is afebrile initially tachypneic which improved with deep breathing.  Will cover with doxycycline, prednisone, and promethazine as needed for cough.  Strict ER precautions given if symptoms worsen or do not improve.  Work note given to remain out of work for the next 48 hours to allow time for symptoms to resolve.  Given patient  had a recent  negative COVID-19 test will defer any additional testing. Final Clinical Impressions(s) / UC Diagnoses   Final diagnoses:  Acute bronchitis, unspecified organism   Discharge Instructions   None    ED Prescriptions    Medication Sig Dispense  Auth. Provider   doxycycline (VIBRAMYCIN) 100 MG capsule Take 1 capsule (100 mg total) by mouth 2 (two) times daily. 20 capsule Scot Jun, FNP   predniSONE (DELTASONE) 20 MG tablet Take 2 tablets (40 mg total) by mouth daily with breakfast. 10 tablet Scot Jun, FNP   promethazine-dextromethorphan (PROMETHAZINE-DM) 6.25-15 MG/5ML syrup Take 5 mLs by mouth 4 (four) times daily as needed for cough. 120 mL Scot Jun, FNP     PDMP not reviewed this encounter.   Scot Jun, FNP 02/18/20 1535

## 2020-02-27 ENCOUNTER — Ambulatory Visit (INDEPENDENT_AMBULATORY_CARE_PROVIDER_SITE_OTHER): Payer: Medicare Other | Admitting: Family Medicine

## 2020-02-27 ENCOUNTER — Other Ambulatory Visit: Payer: Self-pay

## 2020-02-27 ENCOUNTER — Encounter: Payer: Self-pay | Admitting: Family Medicine

## 2020-02-27 ENCOUNTER — Ambulatory Visit: Payer: Medicare Other | Admitting: Family Medicine

## 2020-02-27 VITALS — BP 124/72 | HR 75 | Temp 98.0°F | Ht 66.0 in | Wt 255.0 lb

## 2020-02-27 DIAGNOSIS — E039 Hypothyroidism, unspecified: Secondary | ICD-10-CM | POA: Diagnosis not present

## 2020-02-27 DIAGNOSIS — Z1321 Encounter for screening for nutritional disorder: Secondary | ICD-10-CM

## 2020-02-27 DIAGNOSIS — E785 Hyperlipidemia, unspecified: Secondary | ICD-10-CM

## 2020-02-27 DIAGNOSIS — Z131 Encounter for screening for diabetes mellitus: Secondary | ICD-10-CM

## 2020-02-27 DIAGNOSIS — E2839 Other primary ovarian failure: Secondary | ICD-10-CM

## 2020-02-27 DIAGNOSIS — I10 Essential (primary) hypertension: Secondary | ICD-10-CM | POA: Diagnosis not present

## 2020-02-27 MED ORDER — LEVOTHYROXINE SODIUM 50 MCG PO TABS: 50.0000 ug | ORAL_TABLET | ORAL | 3 refills | Status: DC

## 2020-02-27 MED ORDER — HYDROCHLOROTHIAZIDE 25 MG PO TABS: 25.0000 mg | ORAL_TABLET | Freq: Every day | ORAL | 3 refills | Status: DC

## 2020-02-27 NOTE — Patient Instructions (Addendum)
Health Maintenance After Age 68 After age 70, you are at a higher risk for certain long-term diseases and infections as well as injuries from falls. Falls are a major cause of broken bones and head injuries in people who are older than age 59. Getting regular preventive care can help to keep you healthy and well. Preventive care includes getting regular testing and making lifestyle changes as recommended by your health care provider. Talk with your health care provider about:  Which screenings and tests you should have. A screening is a test that checks for a disease when you have no symptoms.  A diet and exercise plan that is right for you. What should I know about screenings and tests to prevent falls? Screening and testing are the best ways to find a health problem early. Early diagnosis and treatment give you the best chance of managing medical conditions that are common after age 59. Certain conditions and lifestyle choices may make you more likely to have a fall. Your health care provider may recommend:  Regular vision checks. Poor vision and conditions such as cataracts can make you more likely to have a fall. If you wear glasses, make sure to get your prescription updated if your vision changes.  Medicine review. Work with your health care provider to regularly review all of the medicines you are taking, including over-the-counter medicines. Ask your health care provider about any side effects that may make you more likely to have a fall. Tell your health care provider if any medicines that you take make you feel dizzy or sleepy.  Osteoporosis screening. Osteoporosis is a condition that causes the bones to get weaker. This can make the bones weak and cause them to break more easily.  Blood pressure screening. Blood pressure changes and medicines to control blood pressure can make you feel dizzy.  Strength and balance checks. Your health care provider may recommend certain tests to  check your strength and balance while standing, walking, or changing positions.  Foot health exam. Foot pain and numbness, as well as not wearing proper footwear, can make you more likely to have a fall.  Depression screening. You may be more likely to have a fall if you have a fear of falling, feel emotionally low, or feel unable to do activities that you used to do.  Alcohol use screening. Using too much alcohol can affect your balance and may make you more likely to have a fall. What actions can I take to lower my risk of falls? General instructions  Talk with your health care provider about your risks for falling. Tell your health care provider if: ? You fall. Be sure to tell your health care provider about all falls, even ones that seem minor. ? You feel dizzy, sleepy, or off-balance.  Take over-the-counter and prescription medicines only as told by your health care provider. These include any supplements.  Eat a healthy diet and maintain a healthy weight. A healthy diet includes low-fat dairy products, low-fat (lean) meats, and fiber from whole grains, beans, and lots of fruits and vegetables. Home safety  Remove any tripping hazards, such as rugs, cords, and clutter.  Install safety equipment such as grab bars in bathrooms and safety rails on stairs.  Keep rooms and walkways well-lit. Activity   Follow a regular exercise program to stay fit. This will help you maintain your balance. Ask your health care provider what types of exercise are appropriate for you.  If you need a  cane or walker, use it as recommended by your health care provider.  Wear supportive shoes that have nonskid soles. Lifestyle  Do not drink alcohol if your health care provider tells you not to drink.  If you drink alcohol, limit how much you have: ? 0-1 drink a day for women. ? 0-2 drinks a day for men.  Be aware of how much alcohol is in your drink. In the [place] one drink equals one typical bottle  of beer (12 oz), one-half glass of wine (5 oz), or one shot of hard liquor (1 oz).  Do not use any products that contain nicotine or tobacco, such as cigarettes and e-cigarettes. If you need help quitting, ask your health care provider. Summary  Having a healthy lifestyle and getting preventive care can help to protect your health and wellness after age 7.  Screening and testing are the best way to find a health problem early and help you avoid having a fall. Early diagnosis and treatment give you the best chance for managing medical conditions that are more common for people who are older than age 76.  Falls are a major cause of broken bones and head injuries in people who are older than age 5. Take precautions to prevent a fall at home.  Work with your health care provider to learn what changes you can make to improve your health and wellness and to prevent falls. This information is not intended to replace advice given to you by your health care provider. Make sure you discuss any questions you have with your health care provider. Document Revised: 07/15/2018 Document Reviewed: 02/04/2017 Elsevier Patient Education  [employer].   If you have lab work done today you will be contacted with your lab results within the next 2 weeks.  If you have not heard from [place] then please contact us. The fastest way to get your results is to register for My Chart.   IF you received an x-ray today, you will receive an invoice from [facility]. Please contact [facility] at [phone] with questions or concerns regarding your invoice.   IF you received labwork today, you will receive an invoice from [facility]. Please contact [facility] at [phone] with questions or concerns regarding your invoice.   Our billing staff will not be able to assist you with questions regarding bills from these companies.  You will be contacted with the lab results as soon as they are available.  The fastest way to get your results is to activate your My Chart account. Instructions are located on the last page of this paperwork. If you have not heard from [place] regarding the results in 2 weeks, please contact this office.

## 2020-02-27 NOTE — Progress Notes (Signed)
11/22/20213:41 PM  IVETT LUEBBE 1950/05/12, 69 y.o., female [id number]  Chief Complaint  Patient presents with  . Follow-up    acute bronchitis  . Hypertension    follow up   . Hypothyroidism    HPI:  Patient is a 69 y.o. female with past medical history significant for HTN, hypothyroid who presents today for a recent urgent care visit.  Hypertension HCTZ 16m  BP Readings from Last 3 Encounters:  02/27/20 124/72  02/17/20 123/76  11/08/19 (!) 137/91    Mammogram recheck in December Pap: done Dexa: needs to schedule Menopause > 10 years   Hypothyroidism Levothyroxine 536m Lab Results  Component Value Date   TSH 4.300 08/26/2019    Urgent Care 11/12 Acute Bronchitis Treating based on exam for acute bronchitis.  Patient is afebrile initially tachypneic which improved with deep breathing.  Will cover with doxycycline 10035mid, prednisone 35m53mnd promethazine-dextromethorphan as needed for cough.  Depression screen PHQ [facility] 02/27/2020 08/26/2019 07/06/2019  Decreased Interest 0 0 0  Down, Depressed, Hopeless 0 0 0  PHQ - 2 Score 0 0 0    Fall Risk  02/27/2020 08/26/2019 07/06/2019 05/18/2019 05/10/2019  Falls in the past year? 0 0 0 0 0  Number falls in past yr: 0 0 - 0 0  Injury with Fall? 0 1 - 0 0  Follow up Falls evaluation completed - Falls evaluation completed Falls evaluation completed Falls evaluation completed;Education provided     Allergies  Allergen Reactions  . Penicillins Swelling    Prior to Admission medications   Medication Sig Start Date End Date Taking? Authorizing Provider  aspirin 81 MG tablet Take 81 mg by mouth daily.   Yes [provider]  hydrochlorothiazide (HYDRODIURIL) 25 MG tablet Take 1 tablet (25 mg total) by mouth daily. 08/26/19  Yes SantRutherford Guys  levothyroxine (SYNTHROID) 50 MCG tablet Take 1 tablet (50 mcg total) by mouth every other day. 08/26/19  Yes SantRutherford Guys  Multiple Vitamin (MULTI VITAMIN  DAILY PO) Take 1 tablet by mouth.   Yes [provider]  NON FORMBerniece Papdder support   Yes [provider]  omeprazole (PRILOSEC) 20 MG capsule Take 1 capsule (20 mg total) by mouth every other day. 05/30/19  Yes GuenWillia Craze  POTASSIUM PO Take 1 tablet by mouth as needed.   Yes [provider]  doxycycline (VIBRAMYCIN) 100 MG capsule Take 1 capsule (100 mg total) by mouth 2 (two) times daily. Patient not taking: Reported on 02/27/2020 02/17/20   HarrScot JunP  ipratropium (ATROVENT) 0.03 % nasal spray Place 2 sprays into both nostrils 2 (two) times daily. Patient not taking: Reported on 02/27/2020 12/25/17   SantRutherford Guys  predniSONE (DELTASONE) 20 MG tablet Take 2 tablets (40 mg total) by mouth daily with breakfast. Patient not taking: Reported on 02/27/2020 02/17/20   HarrScot JunP  promethazine-dextromethorphan (PROMETHAZINE-DM) 6.25-15 MG/5ML syrup Take 5 mLs by mouth 4 (four) times daily as needed for cough. Patient not taking: Reported on 02/27/2020 02/17/20   HarrScot JunP    Past Medical History:  Diagnosis Date  . Allergy    seasonal  . Arthritis   . Hypertension   . Thyroid disease   . Vertigo     Past Surgical History:  Procedure Laterality Date  . CHOLECYSTECTOMY    . COLONOSCOPY  06/20/2019  . FOOT SURGERY    . GASTRIC BYPASS  15 years ago at least  . TUBAL LIGATION      Social History   Tobacco Use  . Smoking status: Former Smoker    Years: 15.00    Types: Cigarettes    Quit date: 1990    Years since quitting: 31.9  . Smokeless tobacco: Never Used  Substance Use Topics  . Alcohol use: No    Alcohol/week: 0.0 standard drinks    Family History  Problem Relation Age of Onset  . Heart disease Father   . Hypertension Father   . Heart disease Brother   . Stroke Brother   . Throat cancer Brother   . Heart disease Paternal Grandmother   . Cancer Paternal Grandfather   .  Migraines Neg Hx   . Colon polyps Neg Hx   . Colon cancer Neg Hx   . Esophageal cancer Neg Hx   . Rectal cancer Neg Hx   . Stomach cancer Neg Hx     ROS   OBJECTIVE:  Today's Vitals   02/27/20 1407  BP: 124/72  Pulse: 75  Temp: 98 F (36.7 C)  SpO2: 98%  Weight: 255 lb (115.7 kg)  Height: _0  (1.676 m)   Body mass index is 41.16 kg/m.   Physical Exam  No results found for this or any previous visit (from the past 24 hour(s)).  No results found.   ASSESSMENT and PLAN  Problem List Items Addressed This Visit      Cardiovascular and Mediastinum   Benign essential HTN - Primary   Relevant Medications   hydrochlorothiazide (HYDRODIURIL) 25 MG tablet   Other Relevant Orders   CMP14+EGFR   CBC    Other Visit Diagnoses    Encounter for vitamin deficiency screening       Relevant Orders   Vitamin D, 25-hydroxy   Vitamin B12   Dyslipidemia       Relevant Orders   Lipid Panel   Hypothyroidism, unspecified type       Relevant Medications   levothyroxine (SYNTHROID) 50 MCG tablet   Other Relevant Orders   TSH   Screening for diabetes mellitus       Relevant Orders   Hemoglobin A1c   Estrogen deficiency       Relevant Orders   DG Bone Density    Will follow up with lab results Needs to schedule DEXA. No changes to medications at this time.   Return in about 6 months (around 08/26/2020).    Huston Foley Teena Mangus, FNP-BC Primary Care at [place], [place] [address] Ph.  [phone] Fax [phone]

## 2020-02-28 ENCOUNTER — Other Ambulatory Visit: Payer: Self-pay | Admitting: Family Medicine

## 2020-02-28 DIAGNOSIS — E1169 Type 2 diabetes mellitus with other specified complication: Secondary | ICD-10-CM | POA: Insufficient documentation

## 2020-02-28 DIAGNOSIS — E559 Vitamin D deficiency, unspecified: Secondary | ICD-10-CM | POA: Insufficient documentation

## 2020-02-28 DIAGNOSIS — E7849 Other hyperlipidemia: Secondary | ICD-10-CM | POA: Insufficient documentation

## 2020-02-28 DIAGNOSIS — E119 Type 2 diabetes mellitus without complications: Secondary | ICD-10-CM | POA: Insufficient documentation

## 2020-02-28 LAB — CBC
Hematocrit: 40.6 % (ref 34.0–46.6)
Hemoglobin: 13.6 g/dL (ref 11.1–15.9)
MCH: 30.2 pg (ref 26.6–33.0)
MCHC: 33.5 g/dL (ref 31.5–35.7)
MCV: 90 fL (ref 79–97)
Platelets: 367 10*3/uL (ref 150–450)
RBC: 4.51 x10E6/uL (ref 3.77–5.28)
RDW: 12.4 % (ref 11.7–15.4)
WBC: 10 10*3/uL (ref 3.4–10.8)

## 2020-02-28 LAB — CMP14+EGFR
ALT: 22 IU/L (ref 0–32)
AST: 26 IU/L (ref 0–40)
Albumin/Globulin Ratio: 1.6 (ref 1.2–2.2)
Albumin: 4.2 g/dL (ref 3.8–4.8)
Alkaline Phosphatase: 109 IU/L (ref 44–121)
BUN/Creatinine Ratio: 19 (ref 12–28)
BUN: 22 mg/dL (ref 8–27)
Bilirubin Total: 0.3 mg/dL (ref 0.0–1.2)
CO2: 25 mmol/L (ref 20–29)
Calcium: 9.6 mg/dL (ref 8.7–10.3)
Chloride: 100 mmol/L (ref 96–106)
Creatinine, Ser: 1.15 mg/dL — ABNORMAL HIGH (ref 0.57–1.00)
GFR calc Af Amer: 56 mL/min/{1.73_m2} — ABNORMAL LOW (ref >59)
GFR calc non Af Amer: 49 mL/min/{1.73_m2} — ABNORMAL LOW (ref >59)
Globulin, Total: 2.6 g/dL (ref 1.5–4.5)
Glucose: 112 mg/dL — ABNORMAL HIGH (ref 65–99)
Potassium: 3.7 mmol/L (ref 3.5–5.2)
Sodium: 140 mmol/L (ref 134–144)
Total Protein: 6.8 g/dL (ref 6.0–8.5)

## 2020-02-28 LAB — LIPID PANEL
Chol/HDL Ratio: 4.5 ratio — ABNORMAL HIGH (ref 0.0–4.4)
Cholesterol, Total: 205 mg/dL — ABNORMAL HIGH (ref 100–199)
HDL: 46 mg/dL (ref >39)
LDL Chol Calc (NIH): 125 mg/dL — ABNORMAL HIGH (ref 0–99)
Triglycerides: 193 mg/dL — ABNORMAL HIGH (ref 0–149)
VLDL Cholesterol Cal: 34 mg/dL (ref 5–40)

## 2020-02-28 LAB — HEMOGLOBIN A1C
Est. average glucose Bld gHb Est-mCnc: 140 mg/dL
Hgb A1c MFr Bld: 6.5 % — ABNORMAL HIGH (ref 4.8–5.6)

## 2020-02-28 LAB — VITAMIN D 25 HYDROXY (VIT D DEFICIENCY, FRACTURES): Vit D, 25-Hydroxy: 20.5 ng/mL — ABNORMAL LOW (ref 30.0–100.0)

## 2020-02-28 LAB — VITAMIN B12: Vitamin B-12: 496 pg/mL (ref 232–1245)

## 2020-02-28 LAB — TSH: TSH: 2.57 u[IU]/mL (ref 0.450–4.500)

## 2020-02-28 MED ORDER — METFORMIN HCL ER 500 MG PO TB24: 500.0000 mg | ORAL_TABLET | Freq: Every day | ORAL | 3 refills | Status: DC

## 2020-02-28 MED ORDER — METFORMIN HCL 500 MG PO TABS: 500.0000 mg | ORAL_TABLET | Freq: Two times a day (BID) | ORAL | 3 refills | Status: DC

## 2020-02-28 MED ORDER — ROSUVASTATIN CALCIUM 10 MG PO TABS: 10.0000 mg | ORAL_TABLET | Freq: Every day | ORAL | 3 refills | Status: DC

## 2020-02-28 MED ORDER — VITAMIN D3 50 MCG (2000 UT) PO CAPS: 2000.0000 [IU] | ORAL_CAPSULE | Freq: Every day | ORAL | 3 refills | Status: AC

## 2020-02-28 MED ORDER — METFORMIN HCL 500 MG PO TABS: 500.0000 mg | ORAL_TABLET | Freq: Every day | ORAL | 3 refills | Status: DC

## 2020-02-28 NOTE — Progress Notes (Signed)
Spoke with pt, pt understands that she is to take medication due to the results of lip and a1c. Follow scheduled for 08/30/2020

## 2020-02-28 NOTE — Progress Notes (Signed)
If you could let Janet Leblanc know her TSH looks great on her current dose of Levothyroxine so no changes needed there. Her cholesterol and A1C are both quite elevated. I would recommend starting 2 daily medications for this Crestor and Metformin 500mg  daily. I will place those orders. Her vitamin D is also quite low I would recommend starting a 2000 IU of over the counter vitamin D3 with food daily.

## 2020-03-13 ENCOUNTER — Encounter: Payer: Self-pay | Admitting: Family Medicine

## 2020-05-23 ENCOUNTER — Other Ambulatory Visit: Payer: Self-pay | Admitting: Nurse Practitioner

## 2020-06-19 ENCOUNTER — Encounter: Payer: Self-pay | Admitting: Family Medicine

## 2020-06-19 ENCOUNTER — Other Ambulatory Visit: Payer: Self-pay

## 2020-06-19 ENCOUNTER — Ambulatory Visit (INDEPENDENT_AMBULATORY_CARE_PROVIDER_SITE_OTHER): Payer: Medicare Other | Admitting: Family Medicine

## 2020-06-19 VITALS — BP 132/86 | HR 85 | Temp 97.9°F | Ht 66.0 in | Wt 246.0 lb

## 2020-06-19 DIAGNOSIS — E119 Type 2 diabetes mellitus without complications: Secondary | ICD-10-CM

## 2020-06-19 DIAGNOSIS — I10 Essential (primary) hypertension: Secondary | ICD-10-CM | POA: Diagnosis not present

## 2020-06-19 DIAGNOSIS — E7849 Other hyperlipidemia: Secondary | ICD-10-CM | POA: Diagnosis not present

## 2020-06-19 DIAGNOSIS — M25641 Stiffness of right hand, not elsewhere classified: Secondary | ICD-10-CM

## 2020-06-19 NOTE — Progress Notes (Addendum)
3/15/202212:11 PM  Janet Leblanc [birth year], 70 y.o., female [id number]  Chief Complaint  Patient presents with  . pain in joints of hands    Stiffness, swelling , and pain x 2 weeks     HPI:  Patient is a 70 y.o. female with past medical history significant for HTN, hypothyroid who presents today for a recent urgent care visit.  A few weeks ago she noticed swelling of her joints Denies any trauma Mostly in the right hand Now noticing stiffness to the left hand as well Limited mobility of right pointer finger Equal strong grip strength Her daughter has RA Denies history of joint pain or swelling   Hypertension HCTZ 68m BP Readings from Last 3 Encounters:  06/19/20 132/86  02/27/20 124/72  02/17/20 123/76    Hypothyroidism Levothyroxine 554m Lab Results  Component Value Date   TSH 2.570 02/27/2020    Pre-Diabetes Metformin 50078maily Lab Results  Component Value Date   HGBA1C 6.5 (H) 02/27/2020    HLD Crestor 5m46mily Lab Results  Component Value Date   CHOL 205 (H) 02/27/2020   HDL 46 02/27/2020   LDLCALC 125 (H) 02/27/2020   TRIG 193 (H) 02/27/2020   CHOLHDL 4.5 (H) 02/27/2020   Vitamin D Deficiency OTC vitamin D Last vitamin D Lab Results  Component Value Date   VD25OH 20.5 (L) 02/27/2020    Was called for a dexa scan, their machine was broken stated they will get in contact with her  Health Maintenance  Topic Date Due  . URINE MICROALBUMIN  Never done  . DEXA SCAN  07/05/2020 (Originally 08/15/2016)  . TETANUS/TDAP  07/05/2020 (Originally 08/16/1970)  . PNA vac Low Risk Adult (1 of 2 - PCV13) 07/05/2020 (Originally 08/15/2016)  . HEMOGLOBIN A1C  08/26/2020  . OPHTHALMOLOGY EXAM  02/05/2021  . FOOT EXAM  06/19/2021  . MAMMOGRAM  03/13/2022  . COLONOSCOPY (Pts [id number] coverage will need to be confirmed)  06/19/2029  . INFLUENZA VACCINE  Completed  . COVID-19 Vaccine  Completed  . Hepatitis C Screening  Completed  . HPV  VACCINES  Aged Out     Depression screen [facility] 02/27/2020 08/26/2019  Decreased Interest 0 0 0  Down, Depressed, Hopeless 0 0 0  PHQ - 2 Score 0 0 0    Fall Risk  06/19/2020 02/27/2020 08/26/2019 07/06/2019 05/18/2019  Falls in the past year? 0 0 0 0 0  Number falls in past yr: 0 0 0 - 0  Injury with Fall? 0 0 1 - 0  Follow up Falls evaluation completed Falls evaluation completed - Falls evaluation completed Falls evaluation completed     Allergies  Allergen Reactions  . Penicillins Swelling    Prior to Admission medications   Medication Sig Start Date End Date Taking? Authorizing Provider  aspirin 81 MG tablet Take 81 mg by mouth daily.   Yes [provider]  hydrochlorothiazide (HYDRODIURIL) 25 MG tablet Take 1 tablet (25 mg total) by mouth daily. 08/26/19  Yes SantiRutherford Guys levothyroxine (SYNTHROID) 50 MCG tablet Take 1 tablet (50 mcg total) by mouth every other day. 08/26/19  Yes SantiRutherford Guys Multiple Vitamin (MULTI VITAMIN DAILY PO) Take 1 tablet by mouth.   Yes [provider]  NON FORMUBerniece Papder support   Yes [provider]  omeprazole (PRILOSEC) 20 MG capsule Take 1 capsule (20 mg total) by mouth every other day. 05/30/19  Yes GuentTye Savoy  M, NP  POTASSIUM PO Take 1 tablet by mouth as needed.   Yes [provider]  doxycycline (VIBRAMYCIN) 100 MG capsule Take 1 capsule (100 mg total) by mouth 2 (two) times daily. Patient not taking: Reported on 02/27/2020 02/17/20   Scot Jun, FNP  ipratropium (ATROVENT) 0.03 % nasal spray Place 2 sprays into both nostrils 2 (two) times daily. Patient not taking: Reported on 02/27/2020 12/25/17   Rutherford Guys, MD  predniSONE (DELTASONE) 20 MG tablet Take 2 tablets (40 mg total) by mouth daily with breakfast. Patient not taking: Reported on 02/27/2020 02/17/20   Scot Jun, FNP  promethazine-dextromethorphan (PROMETHAZINE-DM) 6.25-15 MG/5ML syrup  Take 5 mLs by mouth 4 (four) times daily as needed for cough. Patient not taking: Reported on 02/27/2020 02/17/20   Scot Jun, FNP    Past Medical History:  Diagnosis Date  . Allergy    seasonal  . Arthritis   . Hypertension   . Thyroid disease   . Vertigo     Past Surgical History:  Procedure Laterality Date  . CHOLECYSTECTOMY    . COLONOSCOPY  06/20/2019  . FOOT SURGERY    . GASTRIC BYPASS     15 years ago at least  . TUBAL LIGATION      Social History   Tobacco Use  . Smoking status: Former Smoker    Years: 15.00    Types: Cigarettes    Quit date: 1990    Years since quitting: 32.2  . Smokeless tobacco: Never Used  Substance Use Topics  . Alcohol use: No    Alcohol/week: 0.0 standard drinks    Family History  Problem Relation Age of Onset  . Heart disease Father   . Hypertension Father   . Heart disease Brother   . Stroke Brother   . Throat cancer Brother   . Heart disease Paternal Grandmother   . Cancer Paternal Grandfather   . Migraines Neg Hx   . Colon polyps Neg Hx   . Colon cancer Neg Hx   . Esophageal cancer Neg Hx   . Rectal cancer Neg Hx   . Stomach cancer Neg Hx     Review of Systems  Constitutional: Negative for chills, fever and malaise/fatigue.  Respiratory: Negative for cough, shortness of breath and wheezing.   Cardiovascular: Negative for chest pain, palpitations and leg swelling.  Gastrointestinal: Negative for abdominal pain, constipation, diarrhea, heartburn, nausea and vomiting.  Genitourinary: Negative for dysuria, frequency and hematuria.  Musculoskeletal: Positive for joint pain (right fingers). Negative for back pain.  Skin: Negative for rash.  Neurological: Positive for tingling (tips of fingers on right hand). Negative for dizziness, sensory change, focal weakness, weakness and headaches.     OBJECTIVE:  Today's Vitals   06/19/20 1146  BP: 132/86  Pulse: 85  Temp: 97.9 F (36.6 C)  SpO2: 97%  Weight: 246  lb (111.6 kg)  Height: _0  (1.676 m)   Body mass index is 39.71 kg/m.   Physical Exam Constitutional:      General: She is not in acute distress.    Appearance: Normal appearance. She is not ill-appearing.  HENT:     Head: Normocephalic.  Cardiovascular:     Rate and Rhythm: Normal rate and regular rhythm.     Pulses: Normal pulses.     Heart sounds: Normal heart sounds. No murmur heard. No friction rub. No gallop.   Pulmonary:     Effort: Pulmonary effort is normal. No  respiratory distress.     Breath sounds: Normal breath sounds. No stridor. No wheezing, rhonchi or rales.  Abdominal:     General: Bowel sounds are normal.     Palpations: Abdomen is soft.     Tenderness: There is no abdominal tenderness.  Musculoskeletal:     Right hand: Swelling (right upper palm below right pointer finger) present. No tenderness or bony tenderness. Decreased range of motion (right pointer finger). Normal strength. Normal sensation. Normal capillary refill. Normal pulse.     Left hand: Normal strength. Normal sensation.     Right lower leg: No edema.     Left lower leg: No edema.  Skin:    General: Skin is warm and dry.  Neurological:     General: No focal deficit present.     Mental Status: She is alert and oriented to person, place, and time.     Sensory: No sensory deficit.     Motor: No weakness.     Gait: Gait normal.  Psychiatric:        Mood and Affect: Mood normal.        Behavior: Behavior normal.     No results found for this or any previous visit (from the past 24 hour(s)).  No results found.   ASSESSMENT and PLAN  Problem List Items Addressed This Visit      Cardiovascular and Mediastinum   Benign essential HTN   Relevant Orders   CMP14+EGFR     Endocrine   Type 2 diabetes mellitus without complication, without long-term current use of insulin ([place]) - Primary   Relevant Orders   HM Diabetes Foot Exam (Completed)   Hemoglobin A1c   Microalbumin/Creatinine  Ratio, Urine     Other   Other hyperlipidemia   Relevant Orders   Lipid Panel    Other Visit Diagnoses    Stiffness of finger joint of right hand       Relevant Orders   AMB referral to orthopedics   ANA      Plan  Will follow up with lab results  Needs to schedule DEXA.  No changes to medications at this time.  Referral placed to Ortho   Return in about 6 months (around 12/20/2020).   Huston Foley Humaira Sculley, FNP-BC Primary Care at [place], [place] [address] Ph.  [phone] Fax [phone]

## 2020-06-19 NOTE — Patient Instructions (Addendum)
  Health Maintenance After Age 69 After age 69, you are at a higher risk for certain long-term diseases and infections as well as injuries from falls. Falls are a major cause of broken bones and head injuries in people who are older than age 69. Getting regular preventive care can help to keep you healthy and well. Preventive care includes getting regular testing and making lifestyle changes as recommended by your health care provider. Talk with your health care provider about:  Which screenings and tests you should have. A screening is a test that checks for a disease when you have no symptoms.  A diet and exercise plan that is right for you. What should I know about screenings and tests to prevent falls? Screening and testing are the best ways to find a health problem early. Early diagnosis and treatment give you the best chance of managing medical conditions that are common after age 69. Certain conditions and lifestyle choices may make you more likely to have a fall. Your health care provider may recommend:  Regular vision checks. Poor vision and conditions such as cataracts can make you more likely to have a fall. If you wear glasses, make sure to get your prescription updated if your vision changes.  Medicine review. Work with your health care provider to regularly review all of the medicines you are taking, including over-the-counter medicines. Ask your health care provider about any side effects that may make you more likely to have a fall. Tell your health care provider if any medicines that you take make you feel dizzy or sleepy.  Osteoporosis screening. Osteoporosis is a condition that causes the bones to get weaker. This can make the bones weak and cause them to break more easily.  Blood pressure screening. Blood pressure changes and medicines to control blood pressure can make you feel dizzy.  Strength and balance checks. Your health care provider may recommend certain tests to  check your strength and balance while standing, walking, or changing positions.  Foot health exam. Foot pain and numbness, as well as not wearing proper footwear, can make you more likely to have a fall.  Depression screening. You may be more likely to have a fall if you have a fear of falling, feel emotionally low, or feel unable to do activities that you used to do.  Alcohol use screening. Using too much alcohol can affect your balance and may make you more likely to have a fall. What actions can I take to lower my risk of falls? General instructions  Talk with your health care provider about your risks for falling. Tell your health care provider if: ? You fall. Be sure to tell your health care provider about all falls, even ones that seem minor. ? You feel dizzy, sleepy, or off-balance.  Take over-the-counter and prescription medicines only as told by your health care provider. These include any supplements.  Eat a healthy diet and maintain a healthy weight. A healthy diet includes low-fat dairy products, low-fat (lean) meats, and fiber from whole grains, beans, and lots of fruits and vegetables. Home safety  Remove any tripping hazards, such as rugs, cords, and clutter.  Install safety equipment such as grab bars in bathrooms and safety rails on stairs.  Keep rooms and walkways well-lit. Activity  Follow a regular exercise program to stay fit. This will help you maintain your balance. Ask your health care provider what types of exercise are appropriate for you.  If you need a cane   or walker, use it as recommended by your health care provider.  Wear supportive shoes that have nonskid soles.   Lifestyle  Do not drink alcohol if your health care provider tells you not to drink.  If you drink alcohol, limit how much you have: ? 0-1 drink a day for women. ? 0-2 drinks a day for men.  Be aware of how much alcohol is in your drink. In the [place] one drink equals one typical bottle  of beer (12 oz), one-half glass of wine (5 oz), or one shot of hard liquor (1 oz).  Do not use any products that contain nicotine or tobacco, such as cigarettes and e-cigarettes. If you need help quitting, ask your health care provider. Summary  Having a healthy lifestyle and getting preventive care can help to protect your health and wellness after age 69.  Screening and testing are the best way to find a health problem early and help you avoid having a fall. Early diagnosis and treatment give you the best chance for managing medical conditions that are more common for people who are older than age 69.  Falls are a major cause of broken bones and head injuries in people who are older than age 69. Take precautions to prevent a fall at home.  Work with your health care provider to learn what changes you can make to improve your health and wellness and to prevent falls. This information is not intended to replace advice given to you by your health care provider. Make sure you discuss any questions you have with your health care provider. Document Revised: 07/15/2018 Document Reviewed: 02/04/2017 Elsevier Patient Education  2021 [employer]vier Inc.   If you have lab work done today you will be contacted with your lab results within the next 2 weeks.  If you have not heard from us then please contact us. The fastest way to get your results is to register for My Chart.   IF you received an x-ray today, you will receive an invoice from [facility]. Please contact [facility] at [phone] with questions or concerns regarding your invoice.   IF you received labwork today, you will receive an invoice from [facility]. Please contact [facility] at [phone] with questions or concerns regarding your invoice.   Our billing staff will not be able to assist you with questions regarding bills from these companies.  You will be contacted with the lab results as soon as they are available.  The fastest way to get your results is to activate your My Chart account. Instructions are located on the last page of this paperwork. If you have not heard from us regarding the results in 2 weeks, please contact this office.      

## 2020-06-20 ENCOUNTER — Telehealth: Payer: Self-pay

## 2020-06-20 LAB — ANA: Anti Nuclear Antibody (ANA): NEGATIVE

## 2020-06-20 LAB — CMP14+EGFR
ALT: 27 IU/L (ref 0–32)
AST: 23 IU/L (ref 0–40)
Albumin/Globulin Ratio: 1.8 (ref 1.2–2.2)
Albumin: 4.2 g/dL (ref 3.8–4.8)
Alkaline Phosphatase: 101 IU/L (ref 44–121)
BUN/Creatinine Ratio: 16 (ref 12–28)
BUN: 18 mg/dL (ref 8–27)
Bilirubin Total: 0.2 mg/dL (ref 0.0–1.2)
CO2: 23 mmol/L (ref 20–29)
Calcium: 9.4 mg/dL (ref 8.7–10.3)
Chloride: 106 mmol/L (ref 96–106)
Creatinine, Ser: 1.14 mg/dL — ABNORMAL HIGH (ref 0.57–1.00)
Globulin, Total: 2.4 g/dL (ref 1.5–4.5)
Glucose: 89 mg/dL (ref 65–99)
Potassium: 4 mmol/L (ref 3.5–5.2)
Sodium: 143 mmol/L (ref 134–144)
Total Protein: 6.6 g/dL (ref 6.0–8.5)
eGFR: 52 mL/min/{1.73_m2} — ABNORMAL LOW (ref >59)

## 2020-06-20 LAB — LIPID PANEL
Chol/HDL Ratio: 3.7 ratio (ref 0.0–4.4)
Cholesterol, Total: 129 mg/dL (ref 100–199)
HDL: 35 mg/dL — ABNORMAL LOW (ref >39)
LDL Chol Calc (NIH): 61 mg/dL (ref 0–99)
Triglycerides: 202 mg/dL — ABNORMAL HIGH (ref 0–149)
VLDL Cholesterol Cal: 33 mg/dL (ref 5–40)

## 2020-06-20 LAB — HEMOGLOBIN A1C
Est. average glucose Bld gHb Est-mCnc: 128 mg/dL
Hgb A1c MFr Bld: 6.1 % — ABNORMAL HIGH (ref 4.8–5.6)

## 2020-06-20 LAB — MICROALBUMIN / CREATININE URINE RATIO
Creatinine, Urine: 199.8 mg/dL
Microalb/Creat Ratio: 62 mg/g creat — ABNORMAL HIGH (ref 0–29)
Microalbumin, Urine: 124.3 ug/mL

## 2020-06-20 NOTE — Progress Notes (Signed)
Labs look great. A1c and cholesterol look great. No changes needed to medications.

## 2020-06-20 NOTE — Telephone Encounter (Signed)
LVM for patient to return call for details of lab results.

## 2020-07-02 ENCOUNTER — Encounter: Payer: Self-pay | Admitting: Family Medicine

## 2020-07-02 ENCOUNTER — Ambulatory Visit: Payer: Medicare Other | Admitting: Family Medicine

## 2020-07-02 DIAGNOSIS — M79644 Pain in right finger(s): Secondary | ICD-10-CM

## 2020-07-02 DIAGNOSIS — R202 Paresthesia of skin: Secondary | ICD-10-CM

## 2020-07-02 DIAGNOSIS — R200 Anesthesia of skin: Secondary | ICD-10-CM

## 2020-07-02 NOTE — Progress Notes (Signed)
Office Visit Note   Patient: Janet Leblanc           Date of Birth: 1950-11-03           MRN: [id number] Visit Date: 07/02/2020 Requested by: Bari Edward, FNP [address] [place],  [place] [address] PCP: Just, Laurita Quint, FNP  Subjective: Chief Complaint  Patient presents with  . Right Hand - Pain    Pain in the index finger, with swelling and popping x 4 weeks. Numbness in the middle and ring finger. Right-hand dominant.    HPI: 70yo RHD F presenting to clinic with concerns of right index finger pain, catching for the past 2-3 weeks. 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 She says that since this time she has chronically felt weaker in her right hand, with intermittent numbness in her fingers. Currently, her 3rd and 4th fingers are numb, which she says has been consistent over the past few weeks. Despite being right handed, she doesn't feel as though she has been able to open anything with this hand for some time.  No neck pain, no trauma.               ROS:   All other systems were reviewed and are negative.  Objective: Vital Signs: There were no vitals taken for this visit.  Physical Exam:  General:  Alert and oriented, in no acute distress. Pulm:  Breathing unlabored. Psy:  Normal mood, congruent affect. Skin:  Right hand with no bruising, rashes, or erythema. Overlying skin intact.    Right hand/wrist Exam:  No obvious swelling, deformity or discoloration. Right hand with somewhat reduced bulk in thenar eminence as well as within first interosseous compartment, most appreciated within the dorsal hand.   ROM: Right 2nd finger with reduced flexion due to pain. When fully flexed, catching is appreciated. Full Active ROM in wrist and all  other fingers without pain.    No finger rotational abnormality.   Strength: No pain with resisted extension or flexion of the wrist. Finger abduction strength somewhat reduced when compared to the left hand. Strength with wrist flexion also seems somewhat (very mildly) reduced. Grip strength reduced on the right.   Palpation: Tenderness within the A1 pulley area of the right 2nd finger. Nodularity appreciated.   Carpal tunnel: Phalen's negative. Tinel's positive. Cubital Tunnel: Tinnel negative in the cubital and guyon tunnels  Endorses reduced sensation in 3rd and 4th fingers on right.   Sperling Negative.   Brisk capillary refill.    Imaging: No results found.  Assessment & Plan: 70yo RHD F presenting to clinic with concerns of right 2nd trigger finger, but also with chronic weakness in the right hand when compared to the left. Suspect this weakness may be due to chronically untreated carpal tunnel, though may also represent a lower cervical pathology.  - Discussed physical therapy, bracing, vs injection therapy for trigger finger. Patient opted for injection therapy. CSI performed as below, which patient tolerated very well.  - Will order EMG to better evaluate for right hand weakness.  - Patient had no further questions or concerns today.      Procedures: Right Second Finger Trigger Finger Cortisone Injection:  Risks and benefits of procedure  discussed, Patient opted to proceed. Verbal Consent obtained.  Timeout performed.  Skin prepped in a sterile fashion with betadine before further cleansing with alcohol. Ethyl Chloride was used for topical analgesia.  Right 2nd finger A1 pulley area was injected with 1cc 0.25% bupivacaine without epinephrine using a 25G, 1.5in needle. Syringe was removed from the needle, and 6mg  betamethadone was then injected into the flexor tendon sheath.    Patient tolerated the injection well with no immediate complications. Aftercare instructions  were discussed, and patient was given strict return precautions.     PMFS History: Patient Active Problem List   Diagnosis Date Noted  . Type 2 diabetes mellitus without complication, without long-term current use of insulin ([place]) 02/28/2020  . Other hyperlipidemia 02/28/2020  . Vitamin D deficiency 02/28/2020  . Benign essential HTN 03/24/2016  . 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 Tinnitus of both ears 06/08/2014   Past Medical History:  Diagnosis Date  . Allergy    seasonal  . Arthritis   . Hypertension   . Thyroid disease   . Vertigo     Family History  Problem Relation Age of Onset  . Heart disease Father   . Hypertension Father   . Heart disease Brother   . Stroke Brother   . Throat cancer Brother   . Heart disease Paternal Grandmother   . Cancer Paternal Grandfather   . Migraines Neg Hx   . Colon polyps Neg Hx   . Colon cancer Neg Hx   . Esophageal cancer Neg Hx   . Rectal cancer Neg Hx   . Stomach cancer Neg Hx     Past Surgical History:  Procedure Laterality Date  . CHOLECYSTECTOMY    . COLONOSCOPY  06/20/2019  . FOOT SURGERY    . GASTRIC BYPASS     15 years ago at least  . TUBAL LIGATION     Social History   Occupational History  . Occupation: Animator   Tobacco Use  . Smoking status: Former Smoker    Years: 15.00    Types: Cigarettes    Quit date: 1990    Years since quitting: 32.2  . Smokeless tobacco: Never Used  Vaping Use  . Vaping Use: Never used  Substance and Sexual Activity  . Alcohol use: No    Alcohol/week: 0.0 standard drinks  . Drug use: No  . Sexual activity: Not on file

## 2020-07-02 NOTE — Progress Notes (Signed)
I saw and examined the patient with Dr. Elouise Munroe and agree with assessment and plan as outlined.    Right index trigger finger for a month.  Numbness in hand much longer than that, with grip weakness.  Has some atrophy of htenar muscle and in 1-2 webspace.  Will inject trigger finger today.  Nerve studies ordered.

## 2020-07-04 ENCOUNTER — Telehealth: Payer: Self-pay | Admitting: Physical Medicine and Rehabilitation

## 2020-07-04 NOTE — Telephone Encounter (Signed)
Patient returned call to [facility]. Please call patient at [phone].

## 2020-07-05 NOTE — Telephone Encounter (Signed)
Called pt and sch 4/11

## 2020-07-06 ENCOUNTER — Other Ambulatory Visit: Payer: Self-pay

## 2020-07-06 ENCOUNTER — Encounter: Payer: Self-pay | Admitting: Physical Medicine and Rehabilitation

## 2020-07-06 ENCOUNTER — Telehealth: Payer: Self-pay

## 2020-07-06 ENCOUNTER — Ambulatory Visit (INDEPENDENT_AMBULATORY_CARE_PROVIDER_SITE_OTHER): Payer: Medicare Other | Admitting: Physical Medicine and Rehabilitation

## 2020-07-06 DIAGNOSIS — R531 Weakness: Secondary | ICD-10-CM

## 2020-07-06 DIAGNOSIS — R202 Paresthesia of skin: Secondary | ICD-10-CM

## 2020-07-06 NOTE — Telephone Encounter (Signed)
Per a separate note Dr. Junius Roads sent me regarding the results from Dr. Ernestina Patches, the nerve study confirms CTS. He has referred her to [facility] for therapy. If this does not help, then the next step is a surgical consult. I left this full message on the patient's mobile voice mail.

## 2020-07-06 NOTE — Telephone Encounter (Signed)
Please advise 

## 2020-07-06 NOTE — Progress Notes (Addendum)
Janet Leblanc - 70 y.o. female MRN [id number]  Date of birth: June 24, 1950  Office Visit Note: Visit Date: 07/06/2020 PCP: Just, Laurita Quint, FNP Referred by: Just, Laurita Quint, FNP  Subjective: Chief Complaint  Patient presents with  . Right Hand - Numbness, Weakness   HPI:  Janet Leblanc is a 70 y.o. female who comes in today at the request of Dr. Eunice Blase for electrodiagnostic study of the Right upper extremities.  Patient is Right hand dominant.  She reports worsening chronic pain numbness and tingling in the right hand now with numbness and tingling somewhat more in the fourth digit.  She has had some trigger finger in the index finger.  Injection for the trigger finger has not helped that much.  She reports a long history of being told she had carpal tunnel syndrome but no prior electrodiagnostic studies.  She endorses a 10 pain despite medication management and other treatments including injections etc.  She is a non-insulin-dependent diabetic.   ROS Otherwise per HPI.  Assessment & Plan: Visit Diagnoses:    ICD-10-CM   1. Paresthesia of skin  R20.2 NCV with EMG (electromyography)    Ambulatory referral to Occupational Therapy  2. 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   There is no significant electrodiagnostic evidence of any other focal nerve entrapment, brachial plexopathy or cervical radiculopathy.   Recommendations: 1.  Follow-up with referring physician. 2.  Continue current management of symptoms. 3.  Continue use of resting splint at night-time and as needed during the day. 4.  Suggest surgical evaluation.  Meds & Orders: No orders of the defined types were placed in this encounter.   Orders Placed This  Encounter  Procedures  . Ambulatory referral to Occupational Therapy  . NCV with EMG (electromyography)    Follow-up: No follow-ups on file.   Procedures: No procedures performed  EMG & NCV Findings: Evaluation of the right median motor nerve showed prolonged distal onset latency (4.6 ms) and decreased conduction velocity (Elbow-Wrist, 45 m/s).  The right median (across palm) sensory nerve showed no response (Palm) and prolonged distal peak latency (4.9 ms).  All remaining nerves (as indicated in the following tables) were within normal limits.    All examined muscles (as indicated in the following table) showed no evidence of electrical instability.    Impression: The above electrodiagnostic study is ABNORMAL and reveals evidence of a moderate right median nerve entrapment at the wrist (carpal tunnel syndrome) affecting sensory and motor components.   There is no significant electrodiagnostic evidence of any other focal nerve entrapment, brachial plexopathy or cervical radiculopathy.   Recommendations: 1.  Follow-up with referring physician. 2.  Continue current management of symptoms. 3.  Continue use of resting splint at night-time and as needed during the day. 4.  Suggest surgical evaluation.  ___________________________ Laurence Spates FAAPMR Board Certified, American Board of Physical Medicine and Rehabilitation    Nerve Conduction Studies Anti Sensory Summary Table   Stim Site NR Peak (ms) Norm Peak (ms) P-T Amp (V) Norm P-T Amp Site1 Site2 Delta-P (ms) Dist (cm) Vel (m/s) Norm Vel (m/s)  Right Median Acr Palm Anti Sensory (2nd Digit)  32.5C  Wrist    *4.9 <3.6 11.6 >10 Wrist Palm  0.0    Palm *NR  <2.0          Right Radial Anti Sensory (Base 1st Digit)  34.2C  Wrist    2.3 <3.1 23.3  Wrist Base 1st Digit 2.3 0.0    Right Ulnar Anti Sensory (5th Digit)  32.9C  Wrist    3.7 <3.7 20.5 >15.0 Wrist 5th Digit 3.7 14.0 38 >38   Motor Summary Table   Stim Site NR Onset  (ms) Norm Onset (ms) O-P Amp (mV) Norm O-P Amp Site1 Site2 Delta-0 (ms) Dist (cm) Vel (m/s) Norm Vel (m/s)  Right Median Motor (Abd Poll Brev)  34.2C  Wrist    *4.6 <4.2 5.8 >5 Elbow Wrist 4.7 21.0 *45 >50  Elbow    9.3  5.5         Right Ulnar Motor (Abd Dig Min)  33.7C  Wrist    2.7 <4.2 6.5 >3 B Elbow Wrist 3.9 21.0 54 >53  B Elbow    6.6  6.7  A Elbow B Elbow 1.1 10.0 91 >53  A Elbow    7.7  6.7          EMG   Side Muscle Nerve Root Ins Act Fibs Psw Amp Dur Poly Recrt Int Fraser Din Comment  Right Abd Poll Brev Median C8-T1 Nml Nml Nml Nml Nml 0 Nml Nml   Right 1stDorInt Ulnar C8-T1 Nml Nml Nml Nml Nml 0 Nml Nml   Right PronatorTeres Median C6-7 Nml Nml Nml Nml Nml 0 Nml Nml     Nerve Conduction Studies Anti Sensory Left/Right Comparison   Stim Site L Lat (ms) R Lat (ms) L-R Lat (ms) L Amp (V) R Amp (V) L-R Amp (%) Site1 Site2 L Vel (m/s) R Vel (m/s) L-R Vel (m/s)  Median Acr Palm Anti Sensory (2nd Digit)  32.5C  Wrist  *4.9   11.6  Wrist Palm     Palm             Radial Anti Sensory (Base 1st Digit)  34.2C  Wrist  2.3   23.3  Wrist Base 1st Digit     Ulnar Anti Sensory (5th Digit)  32.9C  Wrist  3.7   20.5  Wrist 5th Digit  38    Motor Left/Right Comparison   Stim Site L Lat (ms) R Lat (ms) L-R Lat (ms) L Amp (mV) R Amp (mV) L-R Amp (%) Site1 Site2 L Vel (m/s) R Vel (m/s) L-R Vel (m/s)  Median Motor (Abd Poll Brev)  34.2C  Wrist  *4.6   5.8  Elbow Wrist  *45   Elbow  9.3   5.5        Ulnar Motor (Abd Dig Min)  33.7C  Wrist  2.7   6.5  B Elbow Wrist  54   B Elbow  6.6   6.7  A Elbow B Elbow  91   A Elbow  7.7   6.7           Waveforms:             Clinical History: No specialty comments available.     Objective:  VS:  HT:    WT:   BMI:     BP:   HR: bpm  TEMP: ( )  RESP:  Physical Exam Musculoskeletal:        General: No swelling, tenderness or deformity.     Comments: Inspection reveals no atrophy of the bilateral APB or FDI or hand  intrinsics.  There is no swelling, color changes, allodynia or dystrophic changes. There is 5 out of 5 strength in the bilateral wrist extension, finger abduction and long finger flexion. There is intact sensation to light touch in all dermatomal and peripheral nerve distributions.  There is a positive Phalen's test on the right. There is a negative Hoffmann's test bilaterally.  Skin:    General: Skin is warm and dry.     Findings: No erythema or rash.  Neurological:     General: No focal deficit present.     Mental Status: She is alert and oriented to person, place, and time.     Motor: No weakness or abnormal muscle tone.     Coordination: Coordination normal.  Psychiatric:        Mood and Affect: Mood normal.        Behavior: Behavior normal.      Imaging: NCV with EMG (electromyography)  Result Date: 07/06/2020 Magnus Sinning, MD     07/06/2020 11:27 AM EMG & NCV Findings: Evaluation of the right median motor nerve showed prolonged distal onset latency (4.6 ms) and decreased conduction velocity (Elbow-Wrist, 45 m/s).  The right median (across palm) sensory nerve showed no response (Palm) and prolonged distal peak latency (4.9 ms).  All remaining nerves (as indicated in the following tables) were within normal limits.  All examined muscles (as indicated in the following table) showed no evidence of electrical instability.  Impression: The above electrodiagnostic study is ABNORMAL and reveals evidence of a moderate right median nerve entrapment at the wrist (carpal tunnel syndrome) affecting sensory and motor components. There is no significant electrodiagnostic evidence of any other focal nerve entrapment, brachial plexopathy or cervical radiculopathy. Recommendations: 1.  Follow-up with referring physician. 2.  Continue current management of symptoms. 3.  Continue use of resting splint at night-time and as needed during the day. 4.  Suggest surgical evaluation. ___________________________ Laurence Spates FAAPMR  Board Certified, American Board of Physical Medicine and Rehabilitation Nerve Conduction Studies Anti Sensory Summary Table  Stim Site NR Peak (ms) Norm Peak (ms) P-T Amp (V) Norm P-T Amp Site1 Site2 Delta-P (ms) Dist (cm) Vel (m/s) Norm Vel (m/s) Right Median Acr Palm Anti Sensory (2nd Digit)  32.5C Wrist    *4.9 <3.6 11.6 >10 Wrist Palm  0.0   Palm *NR  <2.0         Right Radial Anti Sensory (Base 1st Digit)  34.2C Wrist    2.3 <3.1 23.3  Wrist Base 1st Digit 2.3 0.0   Right Ulnar Anti Sensory (5th Digit)  32.9C Wrist    3.7 <3.7 20.5 >15.0 Wrist 5th Digit 3.7 14.0 38 >38 Motor Summary Table  Stim Site NR Onset (ms) Norm Onset (ms) O-P Amp (mV) Norm O-P Amp Site1 Site2 Delta-0 (ms) Dist (cm) Vel (m/s) Norm Vel (m/s) Right Median Motor (Abd Poll Brev)  34.2C Wrist    *4.6 <4.2 5.8 >5 Elbow Wrist 4.7 21.0 *45 >50 Elbow    9.3  5.5        Right Ulnar Motor (Abd Dig Min)  33.7C Wrist    2.7 <4.2 6.5 >3 B Elbow Wrist 3.9 21.0 54 >53 B Elbow    6.6  6.7  A Elbow B Elbow 1.1 10.0 91 >53 A Elbow    7.7  6.7        EMG  Side Muscle Nerve Root Ins Act Fibs Psw Amp Dur Poly Recrt Int Fraser Din Comment Right Abd [employer]  Median C8-T1 Nml Nml Nml Nml Nml 0 Nml Nml  Right 1stDorInt Ulnar C8-T1 Nml Nml Nml Nml Nml 0 Nml Nml  Right PronatorTeres Median C6-7 Nml Nml Nml Nml Nml 0 Nml Nml  Nerve Conduction Studies Anti Sensory Left/Right Comparison  Stim Site L Lat (ms) R Lat (ms) L-R Lat (ms) L Amp (V) R Amp (V) L-R Amp (%) Site1 Site2 L Vel (m/s) R Vel (m/s) L-R Vel (m/s) Median Acr Palm Anti Sensory (2nd Digit)  32.5C Wrist  *4.9   11.6  Wrist Palm    Palm            Radial Anti Sensory (Base 1st Digit)  34.2C Wrist  2.3   23.3  Wrist Base 1st Digit    Ulnar Anti Sensory (5th Digit)  32.9C Wrist  3.7   20.5  Wrist 5th Digit  38  Motor Left/Right Comparison  Stim Site L Lat (ms) R Lat (ms) L-R Lat (ms) L Amp (mV) R Amp (mV) L-R Amp (%) Site1 Site2 L Vel (m/s) R Vel (m/s) L-R Vel (m/s) Median Motor (Abd Poll Brev)  34.2C  Wrist  *4.6   5.8  Elbow Wrist  *45  Elbow  9.3   5.5       Ulnar Motor (Abd Dig Min)  33.7C Wrist  2.7   6.5  B Elbow Wrist  54  B Elbow  6.6   6.7  A Elbow B Elbow  91  A Elbow  7.7   6.7       Waveforms:

## 2020-07-06 NOTE — Procedures (Signed)
EMG & NCV Findings: Evaluation of the right median motor nerve showed prolonged distal onset latency (4.6 ms) and decreased conduction velocity (Elbow-Wrist, 45 m/s).  The right median (across palm) sensory nerve showed no response (Palm) and prolonged distal peak latency (4.9 ms).  All remaining nerves (as indicated in the following tables) were within normal limits.    All examined muscles (as indicated in the following table) showed no evidence of electrical instability.    Impression: The above electrodiagnostic study is ABNORMAL and reveals evidence of a moderate right median nerve entrapment at the wrist (carpal tunnel syndrome) affecting sensory and motor components.   There is no significant electrodiagnostic evidence of any other focal nerve entrapment, brachial plexopathy or cervical radiculopathy.   Recommendations: 1.  Follow-up with referring physician. 2.  Continue current management of symptoms. 3.  Continue use of resting splint at night-time and as needed during the day. 4.  Suggest surgical evaluation.  ___________________________ Laurence Spates FAAPMR Board Certified, American Board of Physical Medicine and Rehabilitation    Nerve Conduction Studies Anti Sensory Summary Table   Stim Site NR Peak (ms) Norm Peak (ms) P-T Amp (V) Norm P-T Amp Site1 Site2 Delta-P (ms) Dist (cm) Vel (m/s) Norm Vel (m/s)  Right Median Acr Palm Anti Sensory (2nd Digit)  32.5C  Wrist    *4.9 <3.6 11.6 >10 Wrist Palm  0.0    Palm *NR  <2.0          Right Radial Anti Sensory (Base 1st Digit)  34.2C  Wrist    2.3 <3.1 23.3  Wrist Base 1st Digit 2.3 0.0    Right Ulnar Anti Sensory (5th Digit)  32.9C  Wrist    3.7 <3.7 20.5 >15.0 Wrist 5th Digit 3.7 14.0 38 >38   Motor Summary Table   Stim Site NR Onset (ms) Norm Onset (ms) O-P Amp (mV) Norm O-P Amp Site1 Site2 Delta-0 (ms) Dist (cm) Vel (m/s) Norm Vel (m/s)  Right Median Motor (Abd Poll Brev)  34.2C  Wrist    *4.6 <4.2 5.8 >5 Elbow  Wrist 4.7 21.0 *45 >50  Elbow    9.3  5.5         Right Ulnar Motor (Abd Dig Min)  33.7C  Wrist    2.7 <4.2 6.5 >3 B Elbow Wrist 3.9 21.0 54 >53  B Elbow    6.6  6.7  A Elbow B Elbow 1.1 10.0 91 >53  A Elbow    7.7  6.7          EMG   Side Muscle Nerve Root Ins Act Fibs Psw Amp Dur Poly Recrt Int Fraser Din Comment  Right Abd Poll Brev Median C8-T1 Nml Nml Nml Nml Nml 0 Nml Nml   Right 1stDorInt Ulnar C8-T1 Nml Nml Nml Nml Nml 0 Nml Nml   Right PronatorTeres Median C6-7 Nml Nml Nml Nml Nml 0 Nml Nml     Nerve Conduction Studies Anti Sensory Left/Right Comparison   Stim Site L Lat (ms) R Lat (ms) L-R Lat (ms) L Amp (V) R Amp (V) L-R Amp (%) Site1 Site2 L Vel (m/s) R Vel (m/s) L-R Vel (m/s)  Median Acr Palm Anti Sensory (2nd Digit)  32.5C  Wrist  *4.9   11.6  Wrist Palm     Palm             Radial Anti Sensory (Base 1st Digit)  34.2C  Wrist  2.3   23.3  Wrist Base 1st Digit  Ulnar Anti Sensory (5th Digit)  32.9C  Wrist  3.7   20.5  Wrist 5th Digit  38    Motor Left/Right Comparison   Stim Site L Lat (ms) R Lat (ms) L-R Lat (ms) L Amp (mV) R Amp (mV) L-R Amp (%) Site1 Site2 L Vel (m/s) R Vel (m/s) L-R Vel (m/s)  Median Motor (Abd Poll Brev)  34.2C  Wrist  *4.6   5.8  Elbow Wrist  *45   Elbow  9.3   5.5        Ulnar Motor (Abd Dig Min)  33.7C  Wrist  2.7   6.5  B Elbow Wrist  54   B Elbow  6.6   6.7  A Elbow B Elbow  91   A Elbow  7.7   6.7           Waveforms:

## 2020-07-06 NOTE — Progress Notes (Signed)
Left this full message on the patient's mobile voice mail.

## 2020-07-06 NOTE — Progress Notes (Signed)
Loss of strength in right hand. Numbness in right third and fourth finger. Right hand dominant No lotion per patient Numeric Pain Rating Scale and Functional Assessment Average Pain 8   In the last MONTH (on 0-10 scale) has pain interfered with the following?  1. General activity like being  able to carry out your everyday physical activities such as walking, climbing stairs, carrying groceries, or moving a chair?  Rating(5)

## 2020-07-06 NOTE — Telephone Encounter (Signed)
Sorry to hear that.  Let's wait for the nerve study results.  Based on the results, could consider either hand therapy referral, or surgical consult (to treat CTS and trigger finger).

## 2020-07-06 NOTE — Telephone Encounter (Signed)
Patient wanted to let Dr. Junius Roads know that the injection that she received for her right hand on Monday did not work.  Stated that her hand is still hurting, stiff, and locking up.  If he wants her to come in for an appointment, she is available on Monday and Tuesday.  Cb# [phone].  Please advise.  Thank you.

## 2020-07-16 ENCOUNTER — Telehealth: Payer: Self-pay | Admitting: Podiatry

## 2020-07-16 ENCOUNTER — Ambulatory Visit (INDEPENDENT_AMBULATORY_CARE_PROVIDER_SITE_OTHER): Payer: Medicare Other | Admitting: Family Medicine

## 2020-07-16 ENCOUNTER — Encounter: Payer: Self-pay | Admitting: Family Medicine

## 2020-07-16 ENCOUNTER — Other Ambulatory Visit: Payer: Self-pay

## 2020-07-16 DIAGNOSIS — G5601 Carpal tunnel syndrome, right upper limb: Secondary | ICD-10-CM

## 2020-07-16 DIAGNOSIS — M65331 Trigger finger, right middle finger: Secondary | ICD-10-CM

## 2020-07-16 NOTE — Progress Notes (Signed)
Office Visit Note   Patient: Janet Leblanc           Date of Birth: [birth year]           MRN: [id number] Visit Date: 07/16/2020 Requested by: Bari Edward, FNP [address] [place],  [place] [address] PCP: Just, Laurita Quint, FNP  Subjective: Chief Complaint  Patient presents with  . Right Wrist - Pain, Follow-up    Post ncv with Dr. Ernestina Patches. Right index finger is hurting less now. It does still catch and get stiff at night. Has numbness in the fingers of the right hand.  . Right Hand - Pain, Follow-up    HPI: She is here to discuss nerve study results.  Ongoing right hand numbness, and unfortunately the index trigger finger injection did not help her.  She has been dealing with these symptoms for many many years.  Nerve studies confirm moderate carpal tunnel syndrome.                ROS:   All other systems were reviewed and are negative.  Objective: Vital Signs: There were no vitals taken for this visit.  Physical Exam:  General:  Alert and oriented, in no acute distress. Pulm:  Breathing unlabored. Psy:  Normal mood, congruent affect.    Imaging: No results found.  Assessment & Plan: 1.  Right wrist carpal tunnel syndrome and index trigger finger -Discussed options with her, due to the chronicity of her symptoms she would like to consult with a surgeon so I will refer her to Dr. Amedeo Plenty for evaluation and treatment.     Procedures: No procedures performed        PMFS History: Patient Active Problem List   Diagnosis Date Noted  . Type 2 diabetes mellitus without complication, without long-term current use of insulin ([place]) 02/28/2020  . Other hyperlipidemia 02/28/2020  . Vitamin D deficiency 02/28/2020  . Benign essential HTN 03/24/2016  . 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 Tinnitus of both ears 06/08/2014   Past Medical History:  Diagnosis Date  . Allergy    seasonal  . Arthritis   . Hypertension   . Thyroid disease   .  Vertigo     Family History  Problem Relation Age of Onset  . Heart disease Father   . Hypertension Father   . Heart disease Brother   . Stroke Brother   . Throat cancer Brother   . Heart disease Paternal Grandmother   . Cancer Paternal Grandfather   . Migraines Neg Hx   . Colon polyps Neg Hx   . Colon cancer Neg Hx   . Esophageal cancer Neg Hx   . Rectal cancer Neg Hx   . Stomach cancer Neg Hx     Past Surgical History:  Procedure Laterality Date  . CHOLECYSTECTOMY    . COLONOSCOPY  06/20/2019  . FOOT SURGERY    . GASTRIC BYPASS     15 years ago at least  . TUBAL LIGATION     Social History   Occupational History  . Occupation: Animator   Tobacco Use  . Smoking status: Former Smoker    Years: 15.00    Types: Cigarettes    Quit date: 1990    Years since quitting: 32.2  . Smokeless tobacco: Never Used  Vaping Use  . Vaping Use: Never used  Substance and Sexual Activity  . Alcohol use: No    Alcohol/week: 0.0 standard drinks  .  Drug use: No  . Sexual activity: Not on file

## 2020-07-16 NOTE — Telephone Encounter (Signed)
Called patient lvm to reschedule 4/25 appt to 4/27

## 2020-07-30 ENCOUNTER — Ambulatory Visit: Payer: Medicare Other | Admitting: Podiatry

## 2020-08-06 ENCOUNTER — Ambulatory Visit: Payer: Medicare Other | Admitting: Podiatry

## 2020-08-06 ENCOUNTER — Other Ambulatory Visit: Payer: Self-pay

## 2020-08-06 DIAGNOSIS — M779 Enthesopathy, unspecified: Secondary | ICD-10-CM | POA: Diagnosis not present

## 2020-08-06 DIAGNOSIS — Q828 Other specified congenital malformations of skin: Secondary | ICD-10-CM

## 2020-08-06 DIAGNOSIS — L02619 Cutaneous abscess of unspecified foot: Secondary | ICD-10-CM | POA: Diagnosis not present

## 2020-08-06 DIAGNOSIS — L03119 Cellulitis of unspecified part of limb: Secondary | ICD-10-CM | POA: Diagnosis not present

## 2020-08-06 MED ORDER — TRIAMCINOLONE ACETONIDE 10 MG/ML IJ SUSP
20.0000 mg | Freq: Once | INTRAMUSCULAR | Status: AC
  Administered 2020-08-06: 20 mg

## 2020-08-07 NOTE — Progress Notes (Signed)
Subjective:   Patient ID: Janet Leblanc, female   DOB: 70 y.o.   MRN: [id number]   HPI Patient presents with chronic lesion formation bilateral feet that become painful and also on the left foot she has had redness and she had drainage in the arch and she is concerned about infection   ROS      Objective:  Physical Exam  Neurovascular status intact chronic lesion formation bilateral that is present along with slight redness plantar aspect left foot localized to the mid arch with no active drainage noted     Assessment:  Porokeratotic lesion formation bilateral with the probability of a very low-grade abscess left plantar arch appears to be resolved currently     Plan:  H&P reviewed condition and went ahead today and I debrided lesions fully and then discussed plantar left she will utilize soaks for this and padding and if it were to start to become painful draining or open not she will reappoint immediately and I made her aware of all these findings

## 2020-08-30 ENCOUNTER — Encounter: Payer: Medicare Other | Admitting: Family Medicine

## 2020-09-04 ENCOUNTER — Other Ambulatory Visit: Payer: Self-pay

## 2020-09-04 ENCOUNTER — Ambulatory Visit (HOSPITAL_COMMUNITY)
Admission: EM | Admit: 2020-09-04 | Discharge: 2020-09-04 | Disposition: A | Discharge status: Home / Self Care | Payer: Medicare Other | Attending: Family Medicine | Admitting: Family Medicine

## 2020-09-04 ENCOUNTER — Encounter (HOSPITAL_COMMUNITY): Payer: Self-pay | Admitting: Emergency Medicine

## 2020-09-04 DIAGNOSIS — N3090 Cystitis, unspecified without hematuria: Secondary | ICD-10-CM | POA: Diagnosis present

## 2020-09-04 DIAGNOSIS — B9629 Other Escherichia coli [E. coli] as the cause of diseases classified elsewhere: Secondary | ICD-10-CM

## 2020-09-04 LAB — POCT URINALYSIS DIPSTICK, ED / UC
Bilirubin Urine: NEGATIVE
Glucose, UA: NEGATIVE mg/dL
Nitrite: POSITIVE — AB
Protein, ur: NEGATIVE mg/dL
Specific Gravity, Urine: 1.02 (ref 1.005–1.030)
Urobilinogen, UA: 1 mg/dL (ref 0.0–1.0)
pH: 5.5 (ref 5.0–8.0)

## 2020-09-04 MED ORDER — CEPHALEXIN 500 MG PO CAPS: 500.0000 mg | ORAL_CAPSULE | Freq: Two times a day (BID) | ORAL | 0 refills | Status: DC

## 2020-09-04 NOTE — ED Triage Notes (Signed)
Pt presents with lower back pain xs 2-3 weeks. States also having urinary frequency and urgency.

## 2020-09-05 NOTE — ED Provider Notes (Signed)
[facility]    ASSESSMENT & PLAN:  1. Cystitis    Begin: Meds ordered this encounter  Medications  . cephALEXin (KEFLEX) 500 MG capsule    Sig: Take 1 capsule (500 mg total) by mouth 2 (two) times daily.    Dispense:  14 capsule    Refill:  0   No signs of pyelonephritis. Urine culture sent. Will follow up with her PCP or here if not showing improvement over the next 48 hours, sooner if needed.  Outlined signs and symptoms indicating need for more acute intervention. Patient verbalized understanding. After Visit Summary given.  SUBJECTIVE:  Janet Leblanc is a 70 y.o. female who complains of urinary frequency, urgency and dysuria for the past 2-3 w. Assoc flank pain. Without fever, chills, vaginal discharge or bleeding. Gross hematuria: not present. No specific aggravating or alleviating factors reported. No LE edema. Normal PO intake without n/v/d. Without specific abdominal pain. Ambulatory without difficulty. OTC treatment: none. H/O UTI: yes with similar symptoms.  LMP: No LMP recorded. Patient is postmenopausal.  OBJECTIVE:  Vitals:   09/04/20 1648  BP: (!) 151/82  Pulse: 73  Resp: 18  Temp: 98.5 F (36.9 C)  TempSrc: Oral  SpO2: 97%   General appearance: alert; no distress HENT: oropharynx: moist Lungs: unlabored respirations Abdomen: soft, non-tender; bowel sounds normal; no masses or organomegaly; no guarding or rebound tenderness Back: no CVA tenderness Extremities: no edema; symmetrical with no gross deformities Skin: warm and dry Neurologic: normal gait Psychological: alert and cooperative; normal mood and affect  Labs Reviewed  POCT URINALYSIS DIPSTICK, ED / UC - Abnormal; Notable for the following components:      Result Value   Ketones, ur TRACE (*)    Hgb urine dipstick TRACE (*)    Nitrite POSITIVE (*)    Leukocytes,Ua SMALL (*)    All other components within normal limits  URINE CULTURE    Allergies  Allergen Reactions  .  Penicillins Swelling    Past Medical History:  Diagnosis Date  . Allergy    seasonal  . Arthritis   . Hypertension   . Thyroid disease   . Vertigo    Social History   Socioeconomic History  . Marital status: Married    Spouse name: Therron  . Number of children: 2  . Years of education: 34  . Highest education level: Not on file  Occupational History  . Occupation: Animator   Tobacco Use  . Smoking status: Former Smoker    Years: 15.00    Types: Cigarettes    Quit date: 1990    Years since quitting: 32.4  . Smokeless tobacco: Never Used  Vaping Use  . Vaping Use: Never used  Substance and Sexual Activity  . Alcohol use: No    Alcohol/week: 0.0 standard drinks  . Drug use: No  . Sexual activity: Not on file  Other Topics Concern  . Not on file  Social History Narrative  . Not on file   Social Determinants of Health   Financial Resource Strain: Not on file  Food Insecurity: Not on file  Transportation Needs: Not on file  Physical Activity: Not on file  Stress: Not on file  Social Connections: Not on file  Intimate Partner Violence: Not on file   Family History  Problem Relation Age of Onset  . Heart disease Father   . Hypertension Father   . Heart disease Brother   . Stroke Brother   .  Throat cancer Brother   . Heart disease Paternal Grandmother   . Cancer Paternal Grandfather   . Migraines Neg Hx   . Colon polyps Neg Hx   . Colon cancer Neg Hx   . Esophageal cancer Neg Hx   . Rectal cancer Neg Hx   . Stomach cancer Neg Hx        Vanessa Kick, MD 09/05/20 [phone]

## 2020-09-07 LAB — URINE CULTURE: Culture: >=100000 — AB

## 2020-09-10 ENCOUNTER — Encounter (HOSPITAL_COMMUNITY): Payer: Self-pay | Admitting: Emergency Medicine

## 2020-09-10 ENCOUNTER — Other Ambulatory Visit: Payer: Self-pay

## 2020-09-10 ENCOUNTER — Ambulatory Visit (HOSPITAL_COMMUNITY)
Admission: EM | Admit: 2020-09-10 | Discharge: 2020-09-10 | Disposition: A | Discharge status: Home / Self Care | Payer: Medicare Other | Attending: Emergency Medicine | Admitting: Emergency Medicine

## 2020-09-10 DIAGNOSIS — Z880 Allergy status to penicillin: Secondary | ICD-10-CM | POA: Insufficient documentation

## 2020-09-10 DIAGNOSIS — Z87440 Personal history of urinary (tract) infections: Secondary | ICD-10-CM | POA: Insufficient documentation

## 2020-09-10 DIAGNOSIS — Z7982 Long term (current) use of aspirin: Secondary | ICD-10-CM | POA: Diagnosis not present

## 2020-09-10 DIAGNOSIS — Z87891 Personal history of nicotine dependence: Secondary | ICD-10-CM | POA: Diagnosis not present

## 2020-09-10 DIAGNOSIS — Z79899 Other long term (current) drug therapy: Secondary | ICD-10-CM | POA: Insufficient documentation

## 2020-09-10 DIAGNOSIS — Z7984 Long term (current) use of oral hypoglycemic drugs: Secondary | ICD-10-CM | POA: Insufficient documentation

## 2020-09-10 DIAGNOSIS — M545 Low back pain, unspecified: Secondary | ICD-10-CM

## 2020-09-10 LAB — POCT URINALYSIS DIPSTICK, ED / UC
Bilirubin Urine: NEGATIVE
Glucose, UA: NEGATIVE mg/dL
Hgb urine dipstick: NEGATIVE
Ketones, ur: NEGATIVE mg/dL
Nitrite: NEGATIVE
Protein, ur: NEGATIVE mg/dL
Specific Gravity, Urine: 1.01 (ref 1.005–1.030)
Urobilinogen, UA: 0.2 mg/dL (ref 0.0–1.0)
pH: 6.5 (ref 5.0–8.0)

## 2020-09-10 NOTE — ED Provider Notes (Signed)
[facility]    CSN: [id number] Arrival date & time: 09/10/20  1339      History   Chief Complaint Chief Complaint  Patient presents with  . Back Pain    HPI Janet Leblanc is a 70 y.o. female.   Patient here for evaluation of lower back pain that has been ongoing for the past 3 weeks.  Reports being evaluated approximately 1 week ago and diagnosed with a UTI.  Reports taking antibiotics as prescribed.  Denies any dysuria, urgency, or frequency.  Reports taking ibuprofen for pain 1 day last week which was beneficial.  Denies any trauma, injury, or other precipitating event.  Denies any specific alleviating or aggravating factors.  Denies any fevers, chest pain, shortness of breath, N/V/D, numbness, tingling, weakness, abdominal pain, or headaches.       Past Medical History:  Diagnosis Date  . Allergy    seasonal  . Arthritis   . Hypertension   . Thyroid disease   . Vertigo     Patient Active Problem List   Diagnosis Date Noted  . Type 2 diabetes mellitus without complication, without long-term current use of insulin ([place]) 02/28/2020  . Other hyperlipidemia 02/28/2020  . Vitamin D deficiency 02/28/2020  . Benign essential HTN 03/24/2016  . 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 Tinnitus of both ears 06/08/2014    Past Surgical History:  Procedure Laterality Date  . CHOLECYSTECTOMY    . COLONOSCOPY  06/20/2019  . FOOT SURGERY    . GASTRIC BYPASS     15 years ago at least  . TUBAL LIGATION      OB History   No obstetric history on file.      Home Medications    Prior to Admission medications   Medication Sig Start Date End Date Taking? Authorizing Provider  aspirin 81 MG tablet Take 81 mg by mouth daily.    [provider]  cephALEXin (KEFLEX) 500 MG capsule Take 1 capsule (500 mg total) by mouth 2 (two) times daily. 09/04/20   Vanessa Kick, MD  Cholecalciferol (VITAMIN D3) 50 MCG (2000 UT) capsule Take 1 capsule  (2,000 Units total) by mouth daily. 02/28/20   Just, Laurita Quint, FNP  hydrochlorothiazide (HYDRODIURIL) 25 MG tablet Take 1 tablet (25 mg total) by mouth daily. 02/27/20   Just, Laurita Quint, FNP  levothyroxine (SYNTHROID) 50 MCG tablet Take 1 tablet (50 mcg total) by mouth every other day. 02/27/20   Just, Laurita Quint, FNP  metFORMIN (GLUCOPHAGE XR) 500 MG 24 hr tablet Take 1 tablet (500 mg total) by mouth daily with breakfast. 02/28/20   Just, Laurita Quint, FNP  Multiple Vitamin (MULTI VITAMIN DAILY PO) Take 1 tablet by mouth.    [provider]  NON Berniece Pap bladder support    [provider]  omeprazole (PRILOSEC) 20 MG capsule TAKE ONE CAPSULE BY MOUTH EVERY OTHER DAY 05/23/20   Willia Craze, NP  POTASSIUM PO Take 1 tablet by mouth as needed.    [provider]  rosuvastatin (CRESTOR) 10 MG tablet Take 1 tablet (10 mg total) by mouth daily. 02/28/20   Just, Laurita Quint, FNP    Family History Family History  Problem Relation Age of Onset  . Heart disease Father   . Hypertension Father   . Heart disease Brother   . Stroke Brother   . Throat cancer Brother   . Heart disease Paternal Grandmother   . Cancer Paternal Grandfather   .  Migraines Neg Hx   . Colon polyps Neg Hx   . Colon cancer Neg Hx   . Esophageal cancer Neg Hx   . Rectal cancer Neg Hx   . Stomach cancer Neg Hx     Social History Social History   Tobacco Use  . Smoking status: Former Smoker    Years: 15.00    Types: Cigarettes    Quit date: 1990    Years since quitting: 32.4  . Smokeless tobacco: Never Used  Vaping Use  . Vaping Use: Never used  Substance Use Topics  . Alcohol use: No    Alcohol/week: 0.0 standard drinks  . Drug use: No     Allergies   Penicillins   Review of Systems Review of Systems  Musculoskeletal: Positive for back pain.  All other systems reviewed and are negative.    Physical Exam Triage Vital Signs ED Triage Vitals  Enc Vitals Group     BP  09/10/20 1507 (!) 146/78     Pulse Rate 09/10/20 1507 75     Resp 09/10/20 1507 18     Temp 09/10/20 1507 97.8 F (36.6 C)     Temp src --      SpO2 09/10/20 1507 94 %     Weight --      Height --      Head Circumference --      Peak Flow --      Pain Score 09/10/20 1506 8     Pain Loc --      Pain Edu? --      Excl. in [place]? --    No data found.  Updated Vital Signs BP (!) 146/78   Pulse 75   Temp 97.8 F (36.6 C)   Resp 18   SpO2 94%   Visual Acuity Right Eye Distance:   Left Eye Distance:   Bilateral Distance:    Right Eye Near:   Left Eye Near:    Bilateral Near:     Physical Exam Vitals and nursing note reviewed.  Constitutional:      General: She is not in acute distress.    Appearance: Normal appearance. She is not ill-appearing, toxic-appearing or diaphoretic.  HENT:     Head: Normocephalic and atraumatic.  Eyes:     Conjunctiva/sclera: Conjunctivae normal.  Cardiovascular:     Rate and Rhythm: Normal rate.     Pulses: Normal pulses.  Pulmonary:     Effort: Pulmonary effort is normal.  Abdominal:     General: Abdomen is flat.     Tenderness: There is no right CVA tenderness or left CVA tenderness.  Musculoskeletal:        General: Normal range of motion.     Cervical back: Normal and normal range of motion.     Thoracic back: Normal.     Lumbar back: Tenderness and bony tenderness present. Normal range of motion. Negative right straight leg raise test and negative left straight leg raise test.  Skin:    General: Skin is warm and dry.  Neurological:     General: No focal deficit present.     Mental Status: She is alert and oriented to person, place, and time.  Psychiatric:        Mood and Affect: Mood normal.      UC Treatments / Results  Labs (all labs ordered are listed, but only abnormal results are displayed) Labs Reviewed  POCT URINALYSIS DIPSTICK, ED / UC - Abnormal; Notable  for the following components:      Result Value    Leukocytes,Ua TRACE (*)    All other components within normal limits  URINE CULTURE    EKG   Radiology No results found.  Procedures Procedures (including critical care time)  Medications Ordered in UC Medications - No data to display  Initial Impression / Assessment and Plan / UC Course  I have reviewed the triage vital signs and the nursing notes.  Pertinent labs & imaging results that were available during my care of the patient were reviewed by me and considered in my medical decision making (see chart for details).    Assessment negative for red flags or concerns.  Urinalysis positive for leukocytes, urine culture pending.  UTI not suspected at this time.  Back pain likely related to musculoskeletal pain.  Recommend Tylenol and/or Ibuprofen for pain.  Recommend heating pads or ice for comfort.  May also use Icy/Hot or lidocaine patches.  Follow up with primary care as needed.   Final Clinical Impressions(s) / UC Diagnoses   Final diagnoses:  Acute midline low back pain without sciatica     Discharge Instructions     Your urine does not show any signs of infection.  We will contact you if the culture comes back positive.    Take Tylenol and/or Ibuprofen as needed for pain relief and fever reduction.    Rest as much as possible and drink plenty of fluids.  Ice for 10-15 minutes every 4-6 hours as needed for pain and swelling You can also use Icy Hot/Lidocaine patches for comfort.    Return or go to the Emergency Department if symptoms worsen or do not improve in the next few days.     ED Prescriptions    None     PDMP not reviewed this encounter.   Pearson Forster, NP 09/10/20 [phone]

## 2020-09-10 NOTE — ED Triage Notes (Signed)
Pt is present today with lower back pain. Pt states that she has one last dose of antibiotics to take for her UTI but still has chronic back pain.

## 2020-09-10 NOTE — Discharge Instructions (Signed)
Your urine does not show any signs of infection.  We will contact you if the culture comes back positive.    Take Tylenol and/or Ibuprofen as needed for pain relief and fever reduction.    Rest as much as possible and drink plenty of fluids.  Ice for 10-15 minutes every 4-6 hours as needed for pain and swelling You can also use Icy Hot/Lidocaine patches for comfort.    Return or go to the Emergency Department if symptoms worsen or do not improve in the next few days.

## 2020-09-11 LAB — URINE CULTURE: Culture: NO GROWTH

## 2020-11-16 ENCOUNTER — Ambulatory Visit: Payer: Medicare Other | Admitting: Podiatry

## 2020-11-20 ENCOUNTER — Ambulatory Visit: Payer: Medicare Other | Admitting: Internal Medicine

## 2020-11-28 ENCOUNTER — Ambulatory Visit: Payer: Medicare Other | Admitting: Podiatry

## 2020-11-28 ENCOUNTER — Other Ambulatory Visit: Payer: Self-pay

## 2020-11-28 ENCOUNTER — Encounter: Payer: Self-pay | Admitting: Podiatry

## 2020-11-28 DIAGNOSIS — M778 Other enthesopathies, not elsewhere classified: Secondary | ICD-10-CM | POA: Diagnosis not present

## 2020-11-28 DIAGNOSIS — Q828 Other specified congenital malformations of skin: Secondary | ICD-10-CM

## 2020-11-28 NOTE — Progress Notes (Signed)
Subjective:   Patient ID: Janet Leblanc, female   DOB: 70 y.o.   MRN: [id number]   HPI Patient presents stating that she has a lot of inflammation around the fifth metatarsal head right and lesions on both feet which become sore and she is returning to work neuro   ROS      Objective:  Physical Exam  Vascular status intact with inflammation pain around the fifth MPJ right with plantar keratotic lesions bilateral foot with lucent course     Assessment:  Inflammatory capsulitis fifth MPJ right along with porokeratotic lesions bilateral     Plan:  NP sterile prep injected the fifth MPJ right 3 mg dexamethasone Kenalog 5 mg Xylocaine debrided lesions bilateral instructed on anti-inflammatories and will be seen back as needed

## 2020-12-20 ENCOUNTER — Telehealth: Payer: Self-pay | Admitting: Internal Medicine

## 2020-12-20 DIAGNOSIS — E7849 Other hyperlipidemia: Secondary | ICD-10-CM

## 2020-12-20 DIAGNOSIS — E119 Type 2 diabetes mellitus without complications: Secondary | ICD-10-CM

## 2020-12-20 DIAGNOSIS — I10 Essential (primary) hypertension: Secondary | ICD-10-CM

## 2020-12-20 NOTE — Telephone Encounter (Signed)
Patient requesting short supply refill until she can have appointment 11/10  Please advise

## 2020-12-26 NOTE — Telephone Encounter (Signed)
Called to verify which medication pt needed refilled. No answer left a VM, to call back.

## 2020-12-27 MED ORDER — METFORMIN HCL ER 500 MG PO TB24: 500.0000 mg | ORAL_TABLET | Freq: Every day | ORAL | 1 refills | Status: DC

## 2020-12-27 MED ORDER — HYDROCHLOROTHIAZIDE 25 MG PO TABS: 25.0000 mg | ORAL_TABLET | Freq: Every day | ORAL | 1 refills | Status: DC

## 2020-12-27 MED ORDER — ROSUVASTATIN CALCIUM 10 MG PO TABS: 10.0000 mg | ORAL_TABLET | Freq: Every day | ORAL | 1 refills | Status: DC

## 2020-12-27 NOTE — Telephone Encounter (Signed)
Refilled medication until pt can see provider for additional refills.

## 2020-12-27 NOTE — Telephone Encounter (Signed)
Pt has stated she needs  hydrochlorothiazide (HYDRODIURIL) 25 MG tablet, metFORMIN (GLUCOPHAGE XR) 500 MG 24 hr tablet and rosuvastatin (CRESTOR) 10 MG tablet.

## 2021-01-07 ENCOUNTER — Encounter (HOSPITAL_COMMUNITY): Payer: Self-pay | Admitting: Emergency Medicine

## 2021-01-07 ENCOUNTER — Other Ambulatory Visit: Payer: Self-pay

## 2021-01-07 ENCOUNTER — Ambulatory Visit (HOSPITAL_COMMUNITY)
Admission: EM | Admit: 2021-01-07 | Discharge: 2021-01-07 | Disposition: A | Discharge status: Home / Self Care | Payer: Medicare Other | Attending: Emergency Medicine | Admitting: Emergency Medicine

## 2021-01-07 DIAGNOSIS — N3000 Acute cystitis without hematuria: Secondary | ICD-10-CM | POA: Insufficient documentation

## 2021-01-07 LAB — POCT URINALYSIS DIPSTICK, ED / UC
Bilirubin Urine: NEGATIVE
Glucose, UA: NEGATIVE mg/dL
Hgb urine dipstick: NEGATIVE
Ketones, ur: NEGATIVE mg/dL
Nitrite: POSITIVE — AB
Protein, ur: NEGATIVE mg/dL
Specific Gravity, Urine: <=1.005 (ref 1.005–1.030)
Urobilinogen, UA: 0.2 mg/dL (ref 0.0–1.0)
pH: 6 (ref 5.0–8.0)

## 2021-01-07 MED ORDER — CEPHALEXIN 500 MG PO CAPS: 500.0000 mg | ORAL_CAPSULE | Freq: Two times a day (BID) | ORAL | 0 refills | Status: DC

## 2021-01-07 NOTE — ED Provider Notes (Signed)
[facility]    CSN: [id number] Arrival date & time: 01/07/21  1520      History   Chief Complaint Chief Complaint  Patient presents with   Urinary Frequency    HPI Janet Leblanc is a 70 y.o. female.   Patient presents with urinary urgency, urinary frequency and mild odor for 1 week.  Attempted use of azo which was somewhat effective.  Denies hematuria, abdominal pain, flank pain, discharge, itching, vaginal odor.  History of frequent UTI, taking [place] for prevention.  Past Medical History:  Diagnosis Date   Allergy    seasonal   Arthritis    Hypertension    Thyroid disease    Vertigo     Patient Active Problem List   Diagnosis Date Noted   Type 2 diabetes mellitus without complication, without long-term current use of insulin ([place]) 02/28/2020   Other hyperlipidemia 02/28/2020   Vitamin D deficiency 02/28/2020   Benign essential HTN 03/24/2016   Vertigo of central origin, unspecified laterality 06/08/2014   Tinnitus of both ears 06/08/2014    Past Surgical History:  Procedure Laterality Date   CHOLECYSTECTOMY     COLONOSCOPY  06/20/2019   FOOT SURGERY     GASTRIC BYPASS     15 years ago at least   TUBAL LIGATION      OB History   No obstetric history on file.      Home Medications    Prior to Admission medications   Medication Sig Start Date End Date Taking? Authorizing Provider  aspirin 81 MG tablet Take 81 mg by mouth daily.    [provider]  cephALEXin (KEFLEX) 500 MG capsule Take 1 capsule (500 mg total) by mouth 2 (two) times daily. 01/07/21   Hans Eden, NP  Cholecalciferol (VITAMIN D3) 50 MCG (2000 UT) capsule Take 1 capsule (2,000 Units total) by mouth daily. 02/28/20   Just, Laurita Quint, FNP  hydrochlorothiazide (HYDRODIURIL) 25 MG tablet Take 1 tablet (25 mg total) by mouth daily. 12/27/20   Horald Pollen, MD  levothyroxine (SYNTHROID) 50 MCG tablet Take 1 tablet (50 mcg total) by mouth every other day. 02/27/20    Just, Laurita Quint, FNP  metFORMIN (GLUCOPHAGE XR) 500 MG 24 hr tablet Take 1 tablet (500 mg total) by mouth daily with breakfast. 12/27/20   Horald Pollen, MD  Multiple Vitamin (MULTI VITAMIN DAILY PO) Take 1 tablet by mouth.    [provider]  NON Berniece Pap bladder support    [provider]  omeprazole (PRILOSEC) 20 MG capsule TAKE ONE CAPSULE BY MOUTH EVERY OTHER DAY 05/23/20   Willia Craze, NP  POTASSIUM PO Take 1 tablet by mouth as needed.    [provider]  rosuvastatin (CRESTOR) 10 MG tablet Take 1 tablet (10 mg total) by mouth daily. 12/27/20   Horald Pollen, MD    Family History Family History  Problem Relation Age of Onset   Heart disease Father    Hypertension Father    Heart disease Brother    Stroke Brother    Throat cancer Brother    Heart disease Paternal Grandmother    Cancer Paternal Grandfather    Migraines Neg Hx    Colon polyps Neg Hx    Colon cancer Neg Hx    Esophageal cancer Neg Hx    Rectal cancer Neg Hx    Stomach cancer Neg Hx     Social History Social History   Tobacco  Use   Smoking status: Former    Years: 15.00    Types: Cigarettes    Quit date: 1990    Years since quitting: 32.7   Smokeless tobacco: Never  Vaping Use   Vaping Use: Never used  Substance Use Topics   Alcohol use: No    Alcohol/week: 0.0 standard drinks   Drug use: No     Allergies   Penicillins   Review of Systems Review of Systems  Constitutional: Negative.   HENT: Negative.    Respiratory: Negative.    Cardiovascular: Negative.   Genitourinary:  Positive for frequency and urgency. Negative for decreased urine volume, difficulty urinating, dyspareunia, dysuria, enuresis, flank pain, genital sores, hematuria, menstrual problem, pelvic pain, vaginal bleeding, vaginal discharge and vaginal pain.  Skin: Negative.     Physical Exam Triage Vital Signs ED Triage Vitals  Enc Vitals Group     BP 01/07/21 1659 (!)  157/96     Pulse Rate 01/07/21 1659 79     Resp 01/07/21 1659 18     Temp 01/07/21 1659 (!) 97.4 F (36.3 C)     Temp Source 01/07/21 1659 Oral     SpO2 01/07/21 1659 97 %     Weight --      Height --      Head Circumference --      Peak Flow --      Pain Score 01/07/21 1658 0     Pain Loc --      Pain Edu? --      Excl. in [place]? --    No data found.  Updated Vital Signs BP (!) 157/96 (BP Location: Left Arm)   Pulse 79   Temp (!) 97.4 F (36.3 C) (Oral)   Resp 18   SpO2 97%   Visual Acuity Right Eye Distance:   Left Eye Distance:   Bilateral Distance:    Right Eye Near:   Left Eye Near:    Bilateral Near:     Physical Exam Constitutional:      Appearance: Normal appearance. She is normal weight.  HENT:     Head: Normocephalic.  Eyes:     Extraocular Movements: Extraocular movements intact.  Pulmonary:     Effort: Pulmonary effort is normal.  Skin:    General: Skin is warm and dry.  Neurological:     Mental Status: She is alert and oriented to person, place, and time. Mental status is at baseline.  Psychiatric:        Mood and Affect: Mood normal.        Behavior: Behavior normal.     UC Treatments / Results  Labs (all labs ordered are listed, but only abnormal results are displayed) Labs Reviewed  POCT URINALYSIS DIPSTICK, ED / UC - Abnormal; Notable for the following components:      Result Value   Nitrite POSITIVE (*)    Leukocytes,Ua SMALL (*)    All other components within normal limits  URINE CULTURE    EKG   Radiology No results found.  Procedures Procedures (including critical care time)  Medications Ordered in UC Medications - No data to display  Initial Impression / Assessment and Plan / UC Course  I have reviewed the triage vital signs and the nursing notes.  Pertinent labs & imaging results that were available during my care of the patient were reviewed by me and considered in my medical decision making (see chart for  details).  Acute cystitis without  hematuria   Urinalysis showing nitrates and leukocytes, sent for culture  Keflex 500 twice daily for 7 days Given referral to our [facility], has appointment with PCP in November, may follow-up with urgent care as needed Final Clinical Impressions(s) / UC Diagnoses   Final diagnoses:  Acute cystitis without hematuria     Discharge Instructions      Take Keflex twice a day for 7 days  You have been given information for [facility], you may follow-up as needed for your frequent UTIs if you do not want to wait to see your primary care doctor  You may follow-up with urgent care as needed   ED Prescriptions     Medication Sig Dispense Auth. Provider   cephALEXin (KEFLEX) 500 MG capsule Take 1 capsule (500 mg total) by mouth 2 (two) times daily. 14 capsule Jennye Runquist, Leitha Schuller, NP      PDMP not reviewed this encounter.   Hans Eden, NP 01/07/21 [phone]

## 2021-01-07 NOTE — Discharge Instructions (Addendum)
Take Keflex twice a day for 7 days  You have been given information for [facility], you may follow-up as needed for your frequent UTIs if you do not want to wait to see your primary care doctor  You may follow-up with urgent care as needed

## 2021-01-07 NOTE — ED Triage Notes (Signed)
For a week had urinary urgency then cant go. Reports taken AZO since last week. Hx UTIs. Denies blood in urine or pain.

## 2021-01-08 LAB — URINE CULTURE

## 2021-02-14 ENCOUNTER — Encounter: Payer: Self-pay | Admitting: Emergency Medicine

## 2021-02-14 ENCOUNTER — Ambulatory Visit (INDEPENDENT_AMBULATORY_CARE_PROVIDER_SITE_OTHER): Payer: Medicare Other | Admitting: Emergency Medicine

## 2021-02-14 ENCOUNTER — Other Ambulatory Visit: Payer: Self-pay

## 2021-02-14 VITALS — BP 140/82 | HR 72 | Ht 66.0 in | Wt 247.0 lb

## 2021-02-14 DIAGNOSIS — N390 Urinary tract infection, site not specified: Secondary | ICD-10-CM

## 2021-02-14 DIAGNOSIS — E039 Hypothyroidism, unspecified: Secondary | ICD-10-CM | POA: Insufficient documentation

## 2021-02-14 DIAGNOSIS — I10 Essential (primary) hypertension: Secondary | ICD-10-CM

## 2021-02-14 DIAGNOSIS — R7303 Prediabetes: Secondary | ICD-10-CM | POA: Diagnosis not present

## 2021-02-14 DIAGNOSIS — Z6839 Body mass index (BMI) 39.0-39.9, adult: Secondary | ICD-10-CM

## 2021-02-14 DIAGNOSIS — Z7689 Persons encountering health services in other specified circumstances: Secondary | ICD-10-CM

## 2021-02-14 DIAGNOSIS — E66812 Obesity, class 2: Secondary | ICD-10-CM | POA: Insufficient documentation

## 2021-02-14 DIAGNOSIS — I872 Venous insufficiency (chronic) (peripheral): Secondary | ICD-10-CM

## 2021-02-14 DIAGNOSIS — E7849 Other hyperlipidemia: Secondary | ICD-10-CM

## 2021-02-14 LAB — COMPREHENSIVE METABOLIC PANEL
ALT: 20 U/L (ref 0–35)
AST: 23 U/L (ref 0–37)
Albumin: 4.2 g/dL (ref 3.5–5.2)
Alkaline Phosphatase: 77 U/L (ref 39–117)
BUN: 18 mg/dL (ref 6–23)
CO2: 33 mEq/L — ABNORMAL HIGH (ref 19–32)
Calcium: 9.4 mg/dL (ref 8.4–10.5)
Chloride: 100 mEq/L (ref 96–112)
Creatinine, Ser: 1 mg/dL (ref 0.40–1.20)
GFR: 57.49 mL/min — ABNORMAL LOW (ref >60.00)
Glucose, Bld: 110 mg/dL — ABNORMAL HIGH (ref 70–99)
Potassium: 3.9 mEq/L (ref 3.5–5.1)
Sodium: 139 mEq/L (ref 135–145)
Total Bilirubin: 0.5 mg/dL (ref 0.2–1.2)
Total Protein: 7.1 g/dL (ref 6.0–8.3)

## 2021-02-14 LAB — HEMOGLOBIN A1C: Hgb A1c MFr Bld: 6.4 % (ref 4.6–6.5)

## 2021-02-14 LAB — LIPID PANEL
Cholesterol: 127 mg/dL (ref 0–200)
HDL: 39.7 mg/dL (ref >39.00)
LDL Cholesterol: 57 mg/dL (ref 0–99)
NonHDL: 87.36
Total CHOL/HDL Ratio: 3
Triglycerides: 151 mg/dL — ABNORMAL HIGH (ref 0.0–149.0)
VLDL: 30.2 mg/dL (ref 0.0–40.0)

## 2021-02-14 LAB — TSH: TSH: 2.3 u[IU]/mL (ref 0.35–5.50)

## 2021-02-14 NOTE — Assessment & Plan Note (Signed)
Has seen urologist in the past and plans to follow-up with him. Possible cystoscopy as recommended.

## 2021-02-14 NOTE — Assessment & Plan Note (Signed)
Lab Results  Component Value Date   HGBA1C 6.1 (H) 06/19/2020  Diet and nutrition discussed.  Continue metformin 500 mg twice a day. Hemoglobin A1c done today.

## 2021-02-14 NOTE — Patient Instructions (Signed)

## 2021-02-14 NOTE — Assessment & Plan Note (Signed)
Diet and nutrition discussed.  Advised to decrease amount of daily carbohydrate intake. 

## 2021-02-14 NOTE — Progress Notes (Signed)
Janet Leblanc 70 y.o.   Chief Complaint  Patient presents with   New Patient (Initial Visit)    Pt states that she has frequent UTI's that she would like to discuss and discuss varicose veins     HISTORY OF PRESENT ILLNESS: This is a 70 y.o. female first visit to this office here to establish care with me. Past medical history of gastric bypass surgery. Earlier this year diagnosed with prediabetes and started on metformin on rosuvastatin. History of hypertension on hydrochlorothiazide. Up-to-date with mammograms and colonoscopy. History of frequent UTIs over the past several months.  Has seen urologist in the past.  Recommended cystoscopy but not scheduled yet. Also has history of varicose veins and chronic venous insufficiency. No other complaints or medical concerns today.  HPI   Prior to Admission medications   Medication Sig Start Date End Date Taking? Authorizing Provider  aspirin 81 MG tablet Take 81 mg by mouth daily.   Yes [provider]  Cholecalciferol (VITAMIN D3) 50 MCG (2000 UT) capsule Take 1 capsule (2,000 Units total) by mouth daily. 02/28/20  Yes Just, Laurita Quint, FNP  hydrochlorothiazide (HYDRODIURIL) 25 MG tablet Take 1 tablet (25 mg total) by mouth daily. 12/27/20  Yes Shakyra Mattera, Ines Bloomer, MD  levothyroxine (SYNTHROID) 50 MCG tablet Take 1 tablet (50 mcg total) by mouth every other day. 02/27/20  Yes Just, Laurita Quint, FNP  metFORMIN (GLUCOPHAGE XR) 500 MG 24 hr tablet Take 1 tablet (500 mg total) by mouth daily with breakfast. 12/27/20  Yes Jonette Wassel, Ines Bloomer, MD  Multiple Vitamin (MULTI VITAMIN DAILY PO) Take 1 tablet by mouth.   Yes [provider]  NON Berniece Pap bladder support   Yes [provider]  omeprazole (PRILOSEC) 20 MG capsule TAKE ONE CAPSULE BY MOUTH EVERY OTHER DAY 05/23/20  Yes Willia Craze, NP  POTASSIUM PO Take 1 tablet by mouth as needed.   Yes [provider]  rosuvastatin (CRESTOR) 10 MG tablet  Take 1 tablet (10 mg total) by mouth daily. 12/27/20  Yes Horald Pollen, MD    Allergies  Allergen Reactions   Penicillins Swelling    Patient Active Problem List   Diagnosis Date Noted   Type 2 diabetes mellitus without complication, without long-term current use of insulin ([place]) 02/28/2020   Other hyperlipidemia 02/28/2020   Vitamin D deficiency 02/28/2020   Benign essential HTN 03/24/2016   Vertigo of central origin, unspecified laterality 06/08/2014   Tinnitus of both ears 06/08/2014    Past Medical History:  Diagnosis Date   Allergy    seasonal   Arthritis    Hypertension    Thyroid disease    Vertigo     Past Surgical History:  Procedure Laterality Date   CHOLECYSTECTOMY     COLONOSCOPY  06/20/2019   FOOT SURGERY     GASTRIC BYPASS     15 years ago at least   TUBAL LIGATION      Social History   Socioeconomic History   Marital status: Married    Spouse name: Therron   Number of children: 2   Years of education: 12   Highest education level: Not on file  Occupational History   Occupation: Animator   Tobacco Use   Smoking status: Former    Years: 15.00    Types: Cigarettes    Quit date: 1990    Years since quitting: 32.8   Smokeless tobacco: Never  Vaping Use   Vaping Use: Never  used  Substance and Sexual Activity   Alcohol use: No    Alcohol/week: 0.0 standard drinks   Drug use: No   Sexual activity: Not on file  Other Topics Concern   Not on file  Social History Narrative   Not on file   Social Determinants of Health   Financial Resource Strain: Not on file  Food Insecurity: Not on file  Transportation Needs: Not on file  Physical Activity: Not on file  Stress: Not on file  Social Connections: Not on file  Intimate Partner Violence: Not on file    Family History  Problem Relation Age of Onset   Heart disease Father    Hypertension Father    Heart disease Brother    Stroke Brother    Throat cancer Brother    Heart  disease Paternal Grandmother    Cancer Paternal Grandfather    Migraines Neg Hx    Colon polyps Neg Hx    Colon cancer Neg Hx    Esophageal cancer Neg Hx    Rectal cancer Neg Hx    Stomach cancer Neg Hx      Review of Systems  Constitutional: Negative.  Negative for chills and fever.  HENT: Negative.  Negative for congestion and sore throat.   Respiratory: Negative.  Negative for cough and shortness of breath.   Cardiovascular: Negative.  Negative for chest pain and palpitations.  Gastrointestinal:  Negative for abdominal pain, diarrhea, nausea and vomiting.  Genitourinary: Negative.   Skin: Negative.  Negative for rash.  Neurological:  Negative for dizziness and headaches.  All other systems reviewed and are negative. Vitals:   02/14/21 1458  BP: 140/82  Pulse: 72  SpO2: 97%     Physical Exam Vitals reviewed.  Constitutional:      Appearance: Normal appearance. She is obese.  HENT:     Head: Normocephalic.  Eyes:     Extraocular Movements: Extraocular movements intact.     Pupils: Pupils are equal, round, and reactive to light.  Cardiovascular:     Rate and Rhythm: Normal rate and regular rhythm.     Pulses: Normal pulses.     Heart sounds: Normal heart sounds.  Pulmonary:     Effort: Pulmonary effort is normal.     Breath sounds: Normal breath sounds.  Musculoskeletal:     Cervical back: Normal range of motion and neck supple.  Skin:    General: Skin is warm and dry.     Capillary Refill: Capillary refill takes less than 2 seconds.     Comments: Lower extremities: Positive varicose veins bilaterally  Neurological:     General: No focal deficit present.     Mental Status: She is alert and oriented to person, place, and time.  Psychiatric:        Mood and Affect: Mood normal.        Behavior: Behavior normal.     ASSESSMENT & PLAN: Problem List Items Addressed This Visit       Cardiovascular and Mediastinum   Benign essential HTN    Systolic blood  pressure above goal.  Continue hydrochlorothiazide 25 mg daily. Advised to start monitoring blood pressure readings at home twice a day for the next couple of weeks and keep a log.  Dietary approaches to stop hypertension discussed. BP Readings from Last 3 Encounters:  02/14/21 140/82  01/07/21 (!) 157/96  09/10/20 (!) 146/78         Chronic venous insufficiency  Endocrine   Hypothyroidism    Clinically euthyroid.  TSH level done today. Continue Synthroid 50 mcg daily.      Relevant Orders   TSH     Genitourinary   Recurrent UTI    Has seen urologist in the past and plans to follow-up with him. Possible cystoscopy as recommended.        Other   Other hyperlipidemia    Diet and nutrition discussed.  Continue rosuvastatin 10 mg daily. Lipid profile done today.       Prediabetes - Primary    Lab Results  Component Value Date   HGBA1C 6.1 (H) 06/19/2020  Diet and nutrition discussed.  Continue metformin 500 mg twice a day. Hemoglobin A1c done today.       Relevant Orders   Hemoglobin A1c   Lipid panel   Class 2 severe obesity due to excess calories with serious comorbidity and body mass index (BMI) of 39.0 to 39.9 in adult [facility])    Diet and nutrition discussed.  Advised to decrease amount of daily carbohydrate intake.      Other Visit Diagnoses     Essential hypertension       Relevant Orders   Comprehensive metabolic panel   Encounter to establish care          Patient Instructions  Prediabetes Prediabetes is when your blood sugar (blood glucose) level is higher than normal but not high enough for you to be diagnosed with type 2 diabetes. Having prediabetes puts you at risk for developing type 2 diabetes (type 2 diabetes mellitus). With certain lifestyle changes, you may be able to prevent or delay the onset of type 2 diabetes. This is important because type 2 diabetes can lead to serious complications, such as: Heart  disease. Stroke. Blindness. Kidney disease. Depression. Poor circulation in the feet and legs. In severe cases, this could lead to surgical removal of a leg (amputation). What are the causes? The exact cause of prediabetes is not known. It may result from insulin resistance. Insulin resistance develops when cells in the body do not respond properly to insulin that the body makes. This can cause excess glucose to build up in the blood. High blood glucose (hyperglycemia) can develop. What increases the risk? The following factors may make you more likely to develop this condition: You have a family member with type 2 diabetes. You are older than 45 years. You had a temporary form of diabetes during a pregnancy (gestational diabetes). You had polycystic ovary syndrome (PCOS). You are overweight or obese. You are inactive (sedentary). You have a history of heart disease, including problems with cholesterol levels, high levels of blood fats, or high blood pressure. What are the signs or symptoms? You may have no symptoms. If you do have symptoms, they may include: Increased hunger. Increased thirst. Increased urination. Vision changes, such as blurry vision. Tiredness (fatigue). How is this diagnosed? This condition can be diagnosed with blood tests. Your blood glucose may be checked with one or more of the following tests: A fasting blood glucose (FBG) test. You will not be allowed to eat (you will fast) for at least 8 hours before a blood sample is taken. An A1C blood test (hemoglobin A1C). This test provides information about blood glucose levels over the previous 2?3 months. An oral glucose tolerance test (OGTT). This test measures your blood glucose at two points in time: After fasting. This is your baseline level. Two hours after you drink a beverage that  contains glucose. You may be diagnosed with prediabetes if: Your FBG is 100?125 mg/dL (5.6-6.9 mmol/L). Your A1C level is  5.7?6.4% (39-46 mmol/mol). Your OGTT result is 140?199 mg/dL (7.8-11 mmol/L). These blood tests may be repeated to confirm your diagnosis. How is this treated? Treatment may include dietary and lifestyle changes to help lower your blood glucose and prevent type 2 diabetes from developing. In some cases, medicine may be prescribed to help lower the risk of type 2 diabetes. Follow these instructions at home: Nutrition  Follow a healthy meal plan. This includes eating lean proteins, whole grains, legumes, fresh fruits and vegetables, low-fat dairy products, and healthy fats. Follow instructions from your health care provider about eating or drinking restrictions. Meet with a dietitian to create a healthy eating plan that is right for you. Lifestyle Do moderate-intensity exercise for at least 30 minutes a day on 5 or more days each week, or as told by your health care provider. A mix of activities may be best, such as: Brisk walking, swimming, biking, and weight lifting. Lose weight as told by your health care provider. Losing 5-7% of your body weight can reverse insulin resistance. Do not drink alcohol if: Your health care provider tells you not to drink. You are pregnant, may be pregnant, or are planning to become pregnant. If you drink alcohol: Limit how much you use to: 0-1 drink a day for women. 0-2 drinks a day for men. Be aware of how much alcohol is in your drink. In the [place] one drink equals one 12 oz bottle of beer (355 mL), one 5 oz glass of wine (148 mL), or one 1 oz glass of hard liquor (44 mL). General instructions Take over-the-counter and prescription medicines only as told by your health care provider. You may be prescribed medicines that help lower the risk of type 2 diabetes. Do not use any products that contain nicotine or tobacco, such as cigarettes, e-cigarettes, and chewing tobacco. If you need help quitting, ask your health care provider. Keep all follow-up visits.  This is important. Where to find more information American Diabetes Association: www.diabetes.org Academy of Nutrition and Dietetics: www.eatright.org American Heart Association: www.heart.org Contact a health care provider if: You have any of these symptoms: Increased hunger. Increased urination. Increased thirst. Fatigue. Vision changes, such as blurry vision. Get help right away if you: Have shortness of breath. Feel confused. Vomit or feel like you may vomit. Summary Prediabetes is when your blood sugar (blood glucose)level is higher than normal but not high enough for you to be diagnosed with type 2 diabetes. Having prediabetes puts you at risk for developing type 2 diabetes (type 2 diabetes mellitus). Make lifestyle changes such as eating a healthy diet and exercising regularly to help prevent diabetes. Lose weight as told by your health care provider. This information is not intended to replace advice given to you by your health care provider. Make sure you discuss any questions you have with your health care provider. Document Revised: 06/23/2019 Document Reviewed: 06/23/2019 Elsevier Patient Education  [address], [place] [facility]

## 2021-02-14 NOTE — Assessment & Plan Note (Addendum)
Diet and nutrition discussed.  Continue rosuvastatin 10 mg daily. Lipid profile done today.

## 2021-02-14 NOTE — Assessment & Plan Note (Signed)
Clinically euthyroid.  TSH level done today. Continue Synthroid 50 mcg daily.

## 2021-02-14 NOTE — Assessment & Plan Note (Signed)
Systolic blood pressure above goal.  Continue hydrochlorothiazide 25 mg daily. Advised to start monitoring blood pressure readings at home twice a day for the next couple of weeks and keep a log.  Dietary approaches to stop hypertension discussed. BP Readings from Last 3 Encounters:  02/14/21 140/82  01/07/21 (!) 157/96  09/10/20 (!) 146/78

## 2021-02-15 ENCOUNTER — Other Ambulatory Visit: Payer: Self-pay | Admitting: Emergency Medicine

## 2021-02-15 DIAGNOSIS — E7849 Other hyperlipidemia: Secondary | ICD-10-CM

## 2021-02-15 DIAGNOSIS — I10 Essential (primary) hypertension: Secondary | ICD-10-CM

## 2021-03-12 ENCOUNTER — Other Ambulatory Visit: Payer: Self-pay | Admitting: Emergency Medicine

## 2021-03-12 DIAGNOSIS — E119 Type 2 diabetes mellitus without complications: Secondary | ICD-10-CM

## 2021-05-23 ENCOUNTER — Ambulatory Visit: Payer: Medicare Other | Admitting: Podiatry

## 2021-05-29 ENCOUNTER — Other Ambulatory Visit: Payer: Self-pay | Admitting: Nurse Practitioner

## 2021-05-30 ENCOUNTER — Encounter: Payer: Self-pay | Admitting: Podiatry

## 2021-05-30 ENCOUNTER — Ambulatory Visit: Payer: Medicare Other | Admitting: Podiatry

## 2021-05-30 ENCOUNTER — Other Ambulatory Visit: Payer: Self-pay

## 2021-05-30 DIAGNOSIS — Q828 Other specified congenital malformations of skin: Secondary | ICD-10-CM

## 2021-05-30 DIAGNOSIS — M778 Other enthesopathies, not elsewhere classified: Secondary | ICD-10-CM | POA: Diagnosis not present

## 2021-05-30 DIAGNOSIS — M7752 Other enthesopathy of left foot: Secondary | ICD-10-CM

## 2021-05-30 MED ORDER — TRIAMCINOLONE ACETONIDE 10 MG/ML IJ SUSP
20.0000 mg | Freq: Once | INTRAMUSCULAR | Status: AC
  Administered 2021-05-30: 20 mg

## 2021-05-31 NOTE — Progress Notes (Signed)
Subjective:   Patient ID: Janet Leblanc, female   DOB: 71 y.o.   MRN: [id number]   HPI Patient presents with significant chronic lesion formation subfifth metatarsal first metatarsal bilateral with fluid buildup around the fifth MPJ bilateral that is painful when pressed with 6 months between visits   ROS      Objective:  Physical Exam  Neurovascular status intact with inflammatory changes around the fifth MPJ bilateral with fluid buildup and lesion formation also under the first metatarsal head bilateral with keratotic tissue formation     Assessment:  Inflammatory capsulitis of the fifth MPJ bilateral with fluid buildup pain along with lesion formation semblance of fifth     Plan:  H&P reviewed the continued conservative care did sterile prep and injected the plantar capsule fifth MPJ bilateral 2 mg dexamethasone Kenalog 5 mg Xylocaine debrided lesion sterile instrumentation fifth and first MPJ no bleeding noted and patient will be seen back as needed

## 2021-06-17 ENCOUNTER — Telehealth: Payer: Self-pay | Admitting: Emergency Medicine

## 2021-06-17 DIAGNOSIS — E039 Hypothyroidism, unspecified: Secondary | ICD-10-CM

## 2021-06-17 MED ORDER — LEVOTHYROXINE SODIUM 50 MCG PO TABS: 50.0000 ug | ORAL_TABLET | ORAL | 0 refills | Status: DC

## 2021-06-17 NOTE — Telephone Encounter (Signed)
1.Medication Requested: levothyroxine (SYNTHROID) 50 MCG tablet ? ?2. Pharmacy (Name, [place], [facility]): [facility] - [place], [place]  ?Phone:  [phone] ?Fax:  [phone] ? ? ?3. On Med List: yes ? ?4. Last Visit with PCP: 11.10.22 ? ?5. Next visit date with PCP: 05.16.23 ? ? ?Agent: Please be advised that RX refills may take up to 3 business days. We ask that you follow-up with your pharmacy.  ?

## 2021-06-19 NOTE — Telephone Encounter (Signed)
Medication was filled 06/17/21. ?

## 2021-07-04 ENCOUNTER — Ambulatory Visit: Payer: Medicare Other | Admitting: Nurse Practitioner

## 2021-07-04 ENCOUNTER — Encounter: Payer: Self-pay | Admitting: Nurse Practitioner

## 2021-07-04 VITALS — BP 144/80 | HR 65 | Resp 18 | Ht 67.0 in | Wt 248.8 lb

## 2021-07-04 DIAGNOSIS — Z791 Long term (current) use of non-steroidal anti-inflammatories (NSAID): Secondary | ICD-10-CM | POA: Diagnosis not present

## 2021-07-04 MED ORDER — OMEPRAZOLE 20 MG PO CPDR: 20.0000 mg | DELAYED_RELEASE_CAPSULE | ORAL | 4 refills | Status: DC

## 2021-07-04 NOTE — Patient Instructions (Signed)
We have refilled your Omeprazole. ? ?Follow up as needed. ? ?Thank you for trusting me with your gastrointestinal care!   ? ?Tye Savoy, NP ? ? ? ? ?BMI: ? ?If you are age 70 or older, your body mass index should be between 23-30. Your Body mass index is 38.97 kg/m?Marland Kitchen If this is out of the aforementioned range listed, please consider follow up with your Primary Care Provider. ? ?If you are age 60 or younger, your body mass index should be between 19-25. Your Body mass index is 38.97 kg/m?Marland Kitchen If this is out of the aformentioned range listed, please consider follow up with your Primary Care Provider.  ? ?MY CHART: ? ?The [place] [facility] providers would like to encourage you to use [facility] to communicate with providers for non-urgent requests or questions.  Due to long hold times on the telephone, sending your provider a message by [facility] may be a faster and more efficient way to get a response.  Please allow 48 business hours for a response.  Please remember that this is for non-urgent requests.  ? ?

## 2021-07-04 NOTE — Progress Notes (Signed)
? ? ?ASSESSMENT:   ? ?Patient Profile:  ?Janet Leblanc is a 71 y.o. female known to Dr. Loletha Carrow with a past medical history of hypertension, HLD, hypothyroidism, DM 2, remote gastric bypass, cholecystectomy.  Additional medical history as listed in Janet Leblanc . ? ?# Frequent Daily NSAID use. On daily baby ASA, takes Ibuprofen 3-4 times a week and Aleve 1-2 times a week ? ?PLAN:   ? ?Epigastric pain resolved. Continue Omeprazole 20 mg QOD for prevention of NSAID induced mucosal injury / PUD insetting of frequent NSAID use. Will give # 90, 4 refills.  ? ? ?History of Present Illness  ? ?Chief Complaint : refill on Omeprazole ? ?I saw Janet Leblanc in 2021 for colon cancer screening She was having epigastric pain in setting of heavy NSAID use. NSAID use increase her risk of Gi bleeding, especially given history of gastric bypass. She was started on QOD Omeprazole  ? ?Interval History:  ?Taking Ibuprofen but less than when I saw her before since she had hand surgery. Currently taking about 4  of Ibuprofen a week in addition to Aleve 1-2 times a week and a daily baby asa. Taking Omeprazole 20 mg QOD. No black stools, no N/V or abdominal pain. She feels okay. No GI complaints  ? ?Laboratory data: ? ?  Latest Ref Rng & Units 02/14/2021  ?  3:43 PM 06/19/2020  ?  2:24 PM 02/27/2020  ?  5:17 PM  ?Hepatic Function  ?Total Protein 6.0 - 8.3 g/dL 7.1   6.6   6.8    ?Albumin 3.5 - 5.2 g/dL 4.2   4.2   4.2    ?AST 0 - 37 U/L 23   23   26     ?ALT 0 - 35 U/L 20   27   22     ?Alk Phosphatase 39 - 117 U/L 77   101   109    ?Total Bilirubin 0.2 - 1.2 mg/dL 0.5   0.2   0.3    ? ? ? ?  Latest Ref Rng & Units 02/27/2020  ?  5:17 PM 05/18/2019  ?  2:29 PM 07/20/2018  ? 12:05 PM  ?CBC  ?WBC 3.4 - 10.8 x10E3/uL 10.0   9.8   7.2    ?Hemoglobin 11.1 - 15.9 g/dL 13.6   13.9   14.3    ?Hematocrit 34.0 - 46.6 % 40.6   42.8   41.7    ?Platelets 150 - 450 x10E3/uL 367   343   339    ? ? ?Previous GI Evaluations  ? ?Endoscopies:  ?March 2021 screening  colonoscopy ?Right-sided diverticulosis, otherwise normal ? ?Past Medical History:  ?Diagnosis Date  ? Allergy   ? seasonal  ? Arthritis   ? Hypertension   ? Thyroid disease   ? Vertigo   ? ? ?Past Surgical History:  ?Procedure Laterality Date  ? CHOLECYSTECTOMY    ? COLONOSCOPY  06/20/2019  ? FOOT SURGERY    ? GASTRIC BYPASS    ? 15 years ago at least  ? TUBAL LIGATION    ? ? ?Current Medications, Allergies, Family History and Social History were reviewed in [employer] record. ?  ?  ?Current Outpatient Medications  ?Medication Sig Dispense Refill  ? aspirin 81 MG tablet Take 81 mg by mouth daily.    ? Cholecalciferol (VITAMIN D3) 50 MCG (2000 UT) capsule Take 1 capsule (2,000 Units total) by mouth daily. 90 capsule 3  ? hydrochlorothiazide (HYDRODIURIL) 25  MG tablet TAKE 1 TABLET BY MOUTH  DAILY 60 tablet 5  ? levothyroxine (SYNTHROID) 50 MCG tablet Take 1 tablet (50 mcg total) by mouth every other day. 90 tablet 0  ? Magnesium 500 MG CAPS Take 500 mg by mouth daily.    ? metFORMIN (GLUCOPHAGE-XR) 500 MG 24 hr tablet Take 1 tablet (500 mg total) by mouth daily with breakfast. 90 tablet 3  ? Multiple Vitamin (MULTI VITAMIN DAILY PO) Take 1 tablet by mouth.    ? NON FORMULARY UQUORA bladder support    ? omeprazole (PRILOSEC) 20 MG capsule Take 1 capsule (20 mg total) by mouth every other day. **PLEASE CONTACT OFFICE TO SCHEDULE FOLLOW UP 45 capsule 0  ? POTASSIUM PO Take 1 tablet by mouth as needed.    ? rosuvastatin (CRESTOR) 10 MG tablet TAKE 1 TABLET BY MOUTH  DAILY 60 tablet 5  ? ?No current facility-administered medications for this visit.  ? ? ?Review of Systems: ?No chest pain. No shortness of breath. No urinary complaints.  ? ?Physical Exam:   ? ?Wt Readings from Last 3 Encounters:  ?07/04/21 248 lb 12.8 oz (112.9 kg)  ?02/14/21 247 lb (112 kg)  ?06/19/20 246 lb (111.6 kg)  ? ? ?BP (!) 144/80 (BP Location: Right Arm, Patient Position: Sitting, Cuff Size: Large)   Pulse 65   Resp 18    Ht 5' 7"  (1.702 m)   Wt 248 lb 12.8 oz (112.9 kg)   SpO2 97%   BMI 38.97 kg/m?  ?Constitutional:  Generally well appearing female in no acute distress. ?Psychiatric: Pleasant. Normal mood and affect. Behavior is normal. ?EENT: Pupils normal.  Conjunctivae are normal. No scleral icterus. ?Neck supple.  ?Cardiovascular: Normal rate, regular rhythm. No edema ?Pulmonary/chest: Effort normal and breath sounds normal. No wheezing, rales or rhonchi. ?Abdominal: Soft, nondistended, nontender. Bowel sounds active throughout. There are no masses palpable. No hepatomegaly. ?Neurological: Alert and oriented to person place and time. ?Skin: Skin is warm and dry. No rashes noted. ? ?Tye Savoy, NP  07/04/2021, 10:16 AM ? ? ? ? ? ? ? ? ? ?

## 2021-07-09 NOTE — Progress Notes (Signed)
____________________________________________________________  Attending physician addendum:  Thank you for sending this case to me. I have reviewed the entire note and agree with the plan.   Melitza Metheny Danis, MD  ____________________________________________________________  

## 2021-07-25 ENCOUNTER — Ambulatory Visit: Payer: Medicare Other | Admitting: Emergency Medicine

## 2021-07-25 ENCOUNTER — Telehealth: Payer: Self-pay | Admitting: Emergency Medicine

## 2021-07-25 NOTE — Telephone Encounter (Signed)
Called patient to schedule appointment to be accessed for an allergic reaction to abx. There was no answer, was able to leave a message for a call back. ?

## 2021-07-25 NOTE — Telephone Encounter (Signed)
Pt states she was prescribed amoxicillin by her dentist and is having an allergic reaction  ? ?Pt requesting provider to prescribe an antibiotic ? ?Advised pt to call her dentist and inquire if they can prescribed the antibiotic since she was treated there ? ?Pt states she will fu w/ dentist but still wanted  request sent to provider ?

## 2021-08-20 ENCOUNTER — Encounter: Payer: Self-pay | Admitting: Emergency Medicine

## 2021-08-20 ENCOUNTER — Ambulatory Visit (INDEPENDENT_AMBULATORY_CARE_PROVIDER_SITE_OTHER): Payer: Medicare Other | Admitting: Emergency Medicine

## 2021-08-20 ENCOUNTER — Ambulatory Visit (INDEPENDENT_AMBULATORY_CARE_PROVIDER_SITE_OTHER): Payer: Medicare Other

## 2021-08-20 VITALS — BP 124/82 | HR 66 | Temp 97.6°F | Ht 67.0 in | Wt 241.4 lb

## 2021-08-20 VITALS — BP 124/78 | HR 66 | Temp 97.6°F | Ht 67.0 in | Wt 241.6 lb

## 2021-08-20 DIAGNOSIS — E785 Hyperlipidemia, unspecified: Secondary | ICD-10-CM | POA: Diagnosis not present

## 2021-08-20 DIAGNOSIS — R339 Retention of urine, unspecified: Secondary | ICD-10-CM | POA: Diagnosis not present

## 2021-08-20 DIAGNOSIS — Z0000 Encounter for general adult medical examination without abnormal findings: Secondary | ICD-10-CM | POA: Diagnosis not present

## 2021-08-20 DIAGNOSIS — R7303 Prediabetes: Secondary | ICD-10-CM | POA: Diagnosis not present

## 2021-08-20 DIAGNOSIS — I10 Essential (primary) hypertension: Secondary | ICD-10-CM

## 2021-08-20 DIAGNOSIS — N390 Urinary tract infection, site not specified: Secondary | ICD-10-CM

## 2021-08-20 DIAGNOSIS — E039 Hypothyroidism, unspecified: Secondary | ICD-10-CM | POA: Diagnosis not present

## 2021-08-20 LAB — COMPREHENSIVE METABOLIC PANEL
ALT: 19 U/L (ref 0–35)
AST: 21 U/L (ref 0–37)
Albumin: 3.9 g/dL (ref 3.5–5.2)
Alkaline Phosphatase: 74 U/L (ref 39–117)
BUN: 19 mg/dL (ref 6–23)
CO2: 29 mEq/L (ref 19–32)
Calcium: 9.2 mg/dL (ref 8.4–10.5)
Chloride: 100 mEq/L (ref 96–112)
Creatinine, Ser: 1.15 mg/dL (ref 0.40–1.20)
GFR: 48.43 mL/min — ABNORMAL LOW (ref >60.00)
Glucose, Bld: 107 mg/dL — ABNORMAL HIGH (ref 70–99)
Potassium: 3.5 mEq/L (ref 3.5–5.1)
Sodium: 137 mEq/L (ref 135–145)
Total Bilirubin: 0.4 mg/dL (ref 0.2–1.2)
Total Protein: 6.6 g/dL (ref 6.0–8.3)

## 2021-08-20 LAB — LIPID PANEL
Cholesterol: 112 mg/dL (ref 0–200)
HDL: 35.3 mg/dL — ABNORMAL LOW (ref >39.00)
LDL Cholesterol: 46 mg/dL (ref 0–99)
NonHDL: 76.23
Total CHOL/HDL Ratio: 3
Triglycerides: 152 mg/dL — ABNORMAL HIGH (ref 0.0–149.0)
VLDL: 30.4 mg/dL (ref 0.0–40.0)

## 2021-08-20 LAB — CBC WITH DIFFERENTIAL/PLATELET
Basophils Absolute: 0.1 10*3/uL (ref 0.0–0.1)
Basophils Relative: 0.8 % (ref 0.0–3.0)
Eosinophils Absolute: 0.2 10*3/uL (ref 0.0–0.7)
Eosinophils Relative: 2.8 % (ref 0.0–5.0)
HCT: 37.2 % (ref 36.0–46.0)
Hemoglobin: 12.3 g/dL (ref 12.0–15.0)
Lymphocytes Relative: 30.2 % (ref 12.0–46.0)
Lymphs Abs: 2.2 10*3/uL (ref 0.7–4.0)
MCHC: 33.2 g/dL (ref 30.0–36.0)
MCV: 90.5 fl (ref 78.0–100.0)
Monocytes Absolute: 0.7 10*3/uL (ref 0.1–1.0)
Monocytes Relative: 9.7 % (ref 3.0–12.0)
Neutro Abs: 4.1 10*3/uL (ref 1.4–7.7)
Neutrophils Relative %: 56.5 % (ref 43.0–77.0)
Platelets: 296 10*3/uL (ref 150.0–400.0)
RBC: 4.11 Mil/uL (ref 3.87–5.11)
RDW: 14.1 % (ref 11.5–15.5)
WBC: 7.3 10*3/uL (ref 4.0–10.5)

## 2021-08-20 LAB — HEMOGLOBIN A1C: Hgb A1c MFr Bld: 6.3 % (ref 4.6–6.5)

## 2021-08-20 LAB — TSH: TSH: 2.22 u[IU]/mL (ref 0.35–5.50)

## 2021-08-20 MED ORDER — CIPROFLOXACIN HCL 500 MG PO TABS: 500.0000 mg | ORAL_TABLET | Freq: Two times a day (BID) | ORAL | 0 refills | Status: AC

## 2021-08-20 NOTE — Patient Instructions (Signed)
Urinary Tract Infection, Adult A urinary tract infection (UTI) is an infection of any part of the urinary tract. The urinary tract includes: The kidneys. The ureters. The bladder. The urethra. These organs make, store, and get rid of pee (urine) in the body. What are the causes? This infection is caused by germs (bacteria) in your genital area. These germs grow and cause swelling (inflammation) of your urinary tract. What increases the risk? The following factors may make you more likely to develop this condition: Using a small, thin tube (catheter) to drain pee. Not being able to control when you pee or poop (incontinence). Being female. If you are female, these things can increase the risk: Using these methods to prevent pregnancy: A medicine that kills sperm (spermicide). A device that blocks sperm (diaphragm). Having low levels of a female hormone (estrogen). Being pregnant. You are more likely to develop this condition if: You have genes that add to your risk. You are sexually active. You take antibiotic medicines. You have trouble peeing because of: A prostate that is bigger than normal, if you are female. A blockage in the part of your body that drains pee from the bladder. A kidney stone. A nerve condition that affects your bladder. Not getting enough to drink. Not peeing often enough. You have other conditions, such as: Diabetes. A weak disease-fighting system (immune system). Sickle cell disease. Gout. Injury of the spine. What are the signs or symptoms? Symptoms of this condition include: Needing to pee right away. Peeing small amounts often. Pain or burning when peeing. Blood in the pee. Pee that smells bad or not like normal. Trouble peeing. Pee that is cloudy. Fluid coming from the vagina, if you are female. Pain in the belly or lower back. Other symptoms include: Vomiting. Not feeling hungry. Feeling mixed up (confused). This may be the first symptom in  older adults. Being tired and grouchy (irritable). A fever. Watery poop (diarrhea). How is this treated? Taking antibiotic medicine. Taking other medicines. Drinking enough water. In some cases, you may need to see a specialist. Follow these instructions at home:  Medicines Take over-the-counter and prescription medicines only as told by your doctor. If you were prescribed an antibiotic medicine, take it as told by your doctor. Do not stop taking it even if you start to feel better. General instructions Make sure you: Pee until your bladder is empty. Do not hold pee for a long time. Empty your bladder after sex. Wipe from front to back after peeing or pooping if you are a female. Use each tissue one time when you wipe. Drink enough fluid to keep your pee pale yellow. Keep all follow-up visits. Contact a doctor if: You do not get better after 1-2 days. Your symptoms go away and then come back. Get help right away if: You have very bad back pain. You have very bad pain in your lower belly. You have a fever. You have chills. You feeling like you will vomit or you vomit. Summary A urinary tract infection (UTI) is an infection of any part of the urinary tract. This condition is caused by germs in your genital area. There are many risk factors for a UTI. Treatment includes antibiotic medicines. Drink enough fluid to keep your pee pale yellow. This information is not intended to replace advice given to you by your health care provider. Make sure you discuss any questions you have with your health care provider. Document Revised: 11/04/2019 Document Reviewed: 11/04/2019 Elsevier Patient Education    2023 Elsevier Inc.  

## 2021-08-20 NOTE — Progress Notes (Signed)
Janet Leblanc ?71 y.o. ? ? ?Chief Complaint  ?Patient presents with  ? Follow-up  ? Urinary Retention  ? ? ?HISTORY OF PRESENT ILLNESS: ?This is a 71 y.o. female with history of recurrent UTIs complaining of 3 to 4-day history of urinary retention and feeling like prior UTIs.  Denies fever.  Denies nausea or vomiting.  Able to eat and drink.  Denies flank pain.  Denies hematuria or burning.  Just dribbling urine. ?No other complaints or medical concerns today. ?Has history of diabetes, hypertension, and dyslipidemia.  Needs blood work. ? ?HPI ? ? ?Prior to Admission medications   ?Medication Sig Start Date End Date Taking? Authorizing Provider  ?aspirin 81 MG tablet Take 81 mg by mouth daily.   Yes [provider]  ?Cholecalciferol (VITAMIN D3) 50 MCG (2000 UT) capsule Take 1 capsule (2,000 Units total) by mouth daily. 02/28/20  Yes Just, Laurita Quint, FNP  ?hydrochlorothiazide (HYDRODIURIL) 25 MG tablet TAKE 1 TABLET BY MOUTH  DAILY 02/17/21  Yes Astella Desir, Ines Bloomer, MD  ?levothyroxine (SYNTHROID) 50 MCG tablet Take 1 tablet (50 mcg total) by mouth every other day. 06/17/21  Yes SagardiaInes Bloomer, MD  ?Magnesium 500 MG CAPS Take 500 mg by mouth daily.   Yes [provider]  ?Multiple Vitamin (MULTI VITAMIN DAILY PO) Take 1 tablet by mouth.   Yes [provider]  ?Carlota Raspberry bladder support   Yes [provider]  ?omeprazole (PRILOSEC) 20 MG capsule Take 1 capsule (20 mg total) by mouth every other day. 07/04/21  Yes Willia Craze, NP  ?POTASSIUM PO Take 1 tablet by mouth as needed.   Yes [provider]  ?rosuvastatin (CRESTOR) 10 MG tablet TAKE 1 TABLET BY MOUTH  DAILY 02/17/21  Yes Horald Pollen, MD  ?metFORMIN (GLUCOPHAGE-XR) 500 MG 24 hr tablet Take 1 tablet (500 mg total) by mouth daily with breakfast. 03/13/21 07/04/21  Horald Pollen, MD  ? ? ?Allergies  ?Allergen Reactions  ? Amoxicillin Rash  ? Penicillins Swelling  ? ? ?Patient Active  Problem List  ? Diagnosis Date Noted  ? Prediabetes 02/14/2021  ? Chronic venous insufficiency 02/14/2021  ? Recurrent UTI 02/14/2021  ? Hypothyroidism 02/14/2021  ? Class 2 severe obesity due to excess calories with serious comorbidity and body mass index (BMI) of 39.0 to 39.9 in adult [facility]) 02/14/2021  ? Type 2 diabetes mellitus without complication, without long-term current use of insulin ([place]) 02/28/2020  ? Other hyperlipidemia 02/28/2020  ? Vitamin D deficiency 02/28/2020  ? Benign essential HTN 03/24/2016  ? Vertigo of central origin, unspecified laterality 06/08/2014  ? Tinnitus of both ears 06/08/2014  ? ? ?Past Medical History:  ?Diagnosis Date  ? Allergy   ? seasonal  ? Arthritis   ? Hypertension   ? Thyroid disease   ? Vertigo   ? ? ?Past Surgical History:  ?Procedure Laterality Date  ? CHOLECYSTECTOMY    ? COLONOSCOPY  06/20/2019  ? FOOT SURGERY    ? GASTRIC BYPASS    ? 15 years ago at least  ? TUBAL LIGATION    ? ? ?Social History  ? ?Socioeconomic History  ? Marital status: Married  ?  Spouse name: Donley Redder  ? Number of children: 2  ? Years of education: 83  ? Highest education level: Not on file  ?Occupational History  ? Occupation: Animator   ?Tobacco Use  ? Smoking status: Former  ?  Years: 15.00  ?  Types: Cigarettes  ?  Quit date: 50  ?  Years since quitting: 33.3  ? Smokeless tobacco: Never  ?Vaping Use  ? Vaping Use: Never used  ?Substance and Sexual Activity  ? Alcohol use: No  ?  Alcohol/week: 0.0 standard drinks  ? Drug use: No  ? Sexual activity: Not on file  ?Other Topics Concern  ? Not on file  ?Social History Narrative  ? Not on file  ? ?Social Determinants of Health  ? ?Financial Resource Strain: Not on file  ?Food Insecurity: Not on file  ?Transportation Needs: Not on file  ?Physical Activity: Not on file  ?Stress: Not on file  ?Social Connections: Not on file  ?Intimate Partner Violence: Not on file  ? ? ?Family History  ?Problem Relation Age of Onset  ? Heart disease Father   ?  Hypertension Father   ? Atrial fibrillation Sister   ? Heart disease Brother   ? Stroke Brother   ? Throat cancer Brother   ? Stroke Brother   ? Heart disease Paternal Grandmother   ? Cancer Paternal Grandfather   ? Migraines Neg Hx   ? Colon polyps Neg Hx   ? Colon cancer Neg Hx   ? Esophageal cancer Neg Hx   ? Rectal cancer Neg Hx   ? Stomach cancer Neg Hx   ? ? ? ?Review of Systems  ?Constitutional: Negative.  Negative for chills and fever.  ?HENT: Negative.  Negative for congestion and sore throat.   ?Respiratory: Negative.  Negative for cough and shortness of breath.   ?Cardiovascular: Negative.  Negative for chest pain and palpitations.  ?Gastrointestinal: Negative.  Negative for abdominal pain, blood in stool, diarrhea, melena, nausea and vomiting.  ?Genitourinary:  Negative for dysuria, flank pain, frequency, hematuria and urgency.  ?Musculoskeletal: Negative.   ?Skin: Negative.   ?Neurological: Negative.  Negative for dizziness and headaches.  ?All other systems reviewed and are negative. ? ?Today's Vitals  ? 08/20/21 1446  ?BP: 124/82  ?Pulse: 66  ?Temp: 97.6 ?F (36.4 ?C)  ?TempSrc: Oral  ?SpO2: 95%  ?Weight: 241 lb 6 oz (109.5 kg)  ?Height: '5\' 7"'$  (1.702 m)  ? ?Body mass index is 37.8 kg/m?. ? ?Physical Exam ?Vitals reviewed.  ?Constitutional:   ?   Appearance: Normal appearance. She is obese.  ?HENT:  ?   Head: Normocephalic.  ?Eyes:  ?   Extraocular Movements: Extraocular movements intact.  ?   Pupils: Pupils are equal, round, and reactive to light.  ?Cardiovascular:  ?   Rate and Rhythm: Normal rate and regular rhythm.  ?   Pulses: Normal pulses.  ?   Heart sounds: Normal heart sounds.  ?Pulmonary:  ?   Effort: Pulmonary effort is normal.  ?   Breath sounds: Normal breath sounds.  ?Abdominal:  ?   General: There is no distension.  ?   Palpations: Abdomen is soft.  ?   Tenderness: There is no abdominal tenderness. There is no right CVA tenderness or left CVA tenderness.  ?Musculoskeletal:  ?   Cervical  back: No tenderness.  ?Lymphadenopathy:  ?   Cervical: No cervical adenopathy.  ?Skin: ?   General: Skin is warm and dry.  ?   Capillary Refill: Capillary refill takes less than 2 seconds.  ?Neurological:  ?   General: No focal deficit present.  ?   Mental Status: She is alert and oriented to person, place, and time.  ?Psychiatric:     ?  Mood and Affect: Mood normal.     ?   Behavior: Behavior normal.  ? ? ? ?ASSESSMENT & PLAN: ?A total of 49 minutes was spent with the patient and counseling/coordination of care regarding preparing for this visit, review of most recent office visit notes, review of multiple chronic medical problems and their management, review of all medications, review of today's urinalysis and diagnosis of UTI, and need for antibiotics pending culture, need for blood work today, need for urology evaluation, prognosis, documentation and need for follow-up. ? ?Problem List Items Addressed This Visit   ? ?  ? Cardiovascular and Mediastinum  ? Essential hypertension  ?  Well-controlled hypertension.  Continue hydrochlorothiazide 25 mg daily. ?BP Readings from Last 3 Encounters:  ?08/20/21 124/78  ?08/20/21 124/82  ?07/04/21 (!) 144/80  ? ? ?  ?  ? Relevant Orders  ? Comprehensive metabolic panel  ?  ? Endocrine  ? Hypothyroidism  ?  Clinically euthyroid.  TSH done today. ?Continue Synthroid 50 mcg daily. ? ?  ?  ? Relevant Orders  ? TSH  ?  ? Genitourinary  ? Recurrent UTI  ?  Last urology recommendation was to schedule cystoscopy. ?Patient needs new referral.  Sent today. ? ?  ?  ? Relevant Medications  ? ciprofloxacin (CIPRO) 500 MG tablet  ? Other Relevant Orders  ? Ambulatory referral to Urology  ? CBC with Differential/Platelet  ? Urinary retention - Primary  ?  Urinary symptoms suggestive of UTI.  Urinalysis suspicious. ?We will start Cipro 500 mg twice daily for 7 days pending culture. ? ?  ?  ? Relevant Medications  ? ciprofloxacin (CIPRO) 500 MG tablet  ? Other Relevant Orders  ?  Urinalysis, Routine w reflex microscopic  ? Urine Culture  ? CBC with Differential/Platelet  ? Urine Culture  ? Urinalysis, Routine w reflex microscopic  ?  ? Other  ? Prediabetes  ?  Diet and nutrition discuss

## 2021-08-20 NOTE — Progress Notes (Addendum)
? ?Subjective:  ? Janet Leblanc is a 71 y.o. female who presents for Medicare Annual (Subsequent) preventive examination. ? ?Review of Systems    ? ?Cardiac Risk Factors include: advanced age (>75mn, >>27women);diabetes mellitus;hypertension;dyslipidemia;family history of premature cardiovascular disease;obesity (BMI >30kg/m2) ? ?   ?Objective:  ?  ?Today's Vitals  ? 08/20/21 1620  ?BP: 124/78  ?Pulse: 66  ?Temp: 97.6 ?F (36.4 ?C)  ?SpO2: 95%  ?Weight: 241 lb 9.6 oz (109.6 kg)  ?Height: '5\' 7"'$  (1.702 m)  ?PainSc: 0-No pain  ? ?Body mass index is 37.84 kg/m?. ? ? ?  08/20/2021  ?  4:23 PM 05/10/2019  ?  2:10 PM  ?Advanced Directives  ?Does Patient Have a Medical Advance Directive? Yes Yes  ?Type of Advance Directive Living will;Healthcare Power of [place] ?Does patient want to make changes to medical advance directive? No - Patient declined No - Patient declined  ?Copy of [place] Chart? No - copy requested No - copy requested  ? ? ?Current Medications (verified) ?Outpatient Encounter Medications as of 08/20/2021  ?Medication Sig  ? aspirin 81 MG tablet Take 81 mg by mouth daily.  ? Cholecalciferol (VITAMIN D3) 50 MCG (2000 UT) capsule Take 1 capsule (2,000 Units total) by mouth daily.  ? hydrochlorothiazide (HYDRODIURIL) 25 MG tablet TAKE 1 TABLET BY MOUTH  DAILY  ? levothyroxine (SYNTHROID) 50 MCG tablet Take 1 tablet (50 mcg total) by mouth every other day.  ? Magnesium 500 MG CAPS Take 500 mg by mouth daily.  ? metFORMIN (GLUCOPHAGE-XR) 500 MG 24 hr tablet Take 1 tablet (500 mg total) by mouth daily with breakfast.  ? Multiple Vitamin (MULTI VITAMIN DAILY PO) Take 1 tablet by mouth.  ? NON FORMULARY UQUORA bladder support  ? omeprazole (PRILOSEC) 20 MG capsule Take 1 capsule (20 mg total) by mouth every other day.  ? POTASSIUM PO Take 1 tablet by mouth as needed.  ? rosuvastatin (CRESTOR) 10 MG tablet TAKE 1 TABLET BY MOUTH  DAILY  ? ?No facility-administered  encounter medications on file as of 08/20/2021.  ? ? ?Allergies (verified) ?Amoxicillin and Penicillins  ? ?History: ?Past Medical History:  ?Diagnosis Date  ? Allergy   ? seasonal  ? Arthritis   ? Hypertension   ? Thyroid disease   ? Vertigo   ? ?Past Surgical History:  ?Procedure Laterality Date  ? CHOLECYSTECTOMY    ? COLONOSCOPY  06/20/2019  ? FOOT SURGERY    ? GASTRIC BYPASS    ? 15 years ago at least  ? TUBAL LIGATION    ? ?Family History  ?Problem Relation Age of Onset  ? Heart disease Father   ? Hypertension Father   ? Atrial fibrillation Sister   ? Heart disease Brother   ? Stroke Brother   ? Throat cancer Brother   ? Stroke Brother   ? Heart disease Paternal Grandmother   ? Cancer Paternal Grandfather   ? Migraines Neg Hx   ? Colon polyps Neg Hx   ? Colon cancer Neg Hx   ? Esophageal cancer Neg Hx   ? Rectal cancer Neg Hx   ? Stomach cancer Neg Hx   ? ?Social History  ? ?Socioeconomic History  ? Marital status: Married  ?  Spouse name: TDonley Redder ? Number of children: 2  ? Years of education: 169 ? Highest education level: Not on file  ?Occupational History  ? Occupation: HAnimator  ?  Tobacco Use  ? Smoking status: Former  ?  Years: 15.00  ?  Types: Cigarettes  ?  Quit date: 62  ?  Years since quitting: 33.3  ? Smokeless tobacco: Never  ?Vaping Use  ? Vaping Use: Never used  ?Substance and Sexual Activity  ? Alcohol use: No  ?  Alcohol/week: 0.0 standard drinks  ? Drug use: No  ? Sexual activity: Not on file  ?Other Topics Concern  ? Not on file  ?Social History Narrative  ? Not on file  ? ?Social Determinants of Health  ? ?Financial Resource Strain: Low Risk   ? Difficulty of Paying Living Expenses: Not hard at all  ?Food Insecurity: No Food Insecurity  ? Worried About Charity fundraiser in the Last Year: Never true  ? Ran Out of Food in the Last Year: Never true  ?Transportation Needs: No Transportation Needs  ? Lack of Transportation (Medical): No  ? Lack of Transportation (Non-Medical): No   ?Physical Activity: Inactive  ? Days of Exercise per Week: 0 days  ? Minutes of Exercise per Session: 0 min  ?Stress: No Stress Concern Present  ? Feeling of Stress : Not at all  ?Social Connections: Socially Integrated  ? Frequency of Communication with Friends and Family: More than three times a week  ? Frequency of Social Gatherings with Friends and Family: More than three times a week  ? Attends Religious Services: More than 4 times per year  ? Active Member of Clubs or Organizations: Yes  ? Attends Archivist Meetings: More than 4 times per year  ? Marital Status: Married  ? ? ?Tobacco Counseling ?Counseling given: Not Answered ? ? ?Clinical Intake: ? ?Pre-visit preparation completed: Yes ? ?Pain : No/denies pain ?Pain Score: 0-No pain ? ?  ? ?BMI - recorded: 37.84 ?Nutritional Status: BMI > 30  Obese ?Nutritional Risks: None ?Diabetes: Yes ?CBG done?: No ?Did pt. bring in CBG monitor from home?: No ? ?How often do you need to have someone help you when you read instructions, pamphlets, or other written materials from your doctor or pharmacy?: 1 - Never ?What is the last grade level you completed in school?: [employer], some college courses ? ?Diabetic? yes ? ?Interpreter Needed?: No ? ?Information entered by :: Lisette Abu, LPN ? ? ?Activities of Daily Living ? ?  08/20/2021  ?  4:24 PM  ?In your present state of health, do you have any difficulty performing the following activities:  ?Hearing? 0  ?Vision? 0  ?Difficulty concentrating or making decisions? 0  ?Walking or climbing stairs? 0  ?Dressing or bathing? 0  ?Doing errands, shopping? 0  ?Preparing Food and eating ? N  ?Using the Toilet? N  ?In the past six months, have you accidently leaked urine? N  ?Do you have problems with loss of bowel control? N  ?Managing your Medications? N  ?Managing your Finances? N  ?Housekeeping or managing your Housekeeping? N  ? ? ?Patient Care Team: ?Horald Pollen, MD as PCP - General (Internal  Medicine) ?Jodi Marble, MD as Consulting Physician (Otolaryngology) ?Roseanne Kaufman, MD as Consulting Physician (Orthopedic Surgery) ?[facility] [place] ? ?Indicate any recent Medical Services you may have received from other than [facility] providers in the past year (date may be approximate). ? ?   ?Assessment:  ? This is a routine wellness examination for Parilee. ? ?Hearing/Vision screen ?Hearing Screening - Comments:: Patient denied any hearing difficulty.   ?No hearing aids. ? ?Vision  Screening - Comments:: Patient does wear corrective lenses/contacts.  ?Eye exam done QR:[id number] ? ? ?Dietary issues and exercise activities discussed: ?Current Exercise Habits: The patient has a physically strenuous job, but has no regular exercise apart from work., Exercise limited by: None identified ? ? Goals Addressed   ? ?  ?  ?  ?  ? This Visit's Progress  ?  Patient declined health goal at this time.     ? ?  ?Depression Screen ? ?  08/20/2021  ?  3:45 PM 08/20/2021  ?  2:49 PM 02/14/2021  ?  3:03 PM 06/19/2020  ? 11:47 AM 02/27/2020  ?  2:32 PM 08/26/2019  ?  1:31 PM 07/06/2019  ?  2:15 PM  ?PHQ 2/9 Scores  ?PHQ - 2 Score 0 0 0 0 0 0 0  ?  ?Fall Risk ? ?  08/20/2021  ?  3:41 PM 08/20/2021  ?  2:48 PM 02/14/2021  ?  3:03 PM 06/19/2020  ? 11:47 AM 02/27/2020  ?  2:32 PM  ?Fall Risk   ?Falls in the past year? 0 0 0 0 0  ?Number falls in past yr: 0  0 0 0  ?Injury with Fall? 0  0 0 0  ?Risk for fall due to : No Fall Risks      ?Follow up Falls evaluation completed   Falls evaluation completed Falls evaluation completed  ? ? ?FALL RISK PREVENTION PERTAINING TO THE HOME: ? ?Any stairs in or around the home? No  ?If so, are there any without handrails? No  ?Home free of loose throw rugs in walkways, pet beds, electrical cords, etc? Yes  ?Adequate lighting in your home to reduce risk of falls? Yes  ? ?ASSISTIVE DEVICES UTILIZED TO PREVENT FALLS: ? ?Life alert? No  ?Use of a cane, walker or w/c? No  ?Grab bars in the bathroom?  No  ?Shower chair or bench in shower? Yes  ?Elevated toilet seat or a handicapped toilet? Yes  ? ?TIMED UP AND GO: ? ?Was the test performed? Yes .  ?Length of time to ambulate 10 feet: 6 sec.  ? ?Gait steady and fast without

## 2021-08-20 NOTE — Assessment & Plan Note (Signed)
Diet and nutrition discussed. Advised to decrease amount of daily carbohydrate intake.  Hemoglobin A1c done today.  Continue metformin 500 mg twice a day. ?

## 2021-08-20 NOTE — Assessment & Plan Note (Signed)
Well-controlled hypertension.  Continue hydrochlorothiazide 25 mg daily. ?BP Readings from Last 3 Encounters:  ?08/20/21 124/78  ?08/20/21 124/82  ?07/04/21 (!) 144/80  ? ? ?

## 2021-08-20 NOTE — Assessment & Plan Note (Signed)
Last urology recommendation was to schedule cystoscopy. ?Patient needs new referral.  Sent today. ?

## 2021-08-20 NOTE — Assessment & Plan Note (Signed)
Urinary symptoms suggestive of UTI.  Urinalysis suspicious. ?We will start Cipro 500 mg twice daily for 7 days pending culture. ?

## 2021-08-20 NOTE — Assessment & Plan Note (Signed)
Stable.  Diet and nutrition discussed.  Continue rosuvastatin 10 mg daily.  

## 2021-08-20 NOTE — Assessment & Plan Note (Signed)
Clinically euthyroid.  TSH done today. Continue Synthroid 50 mcg daily. 

## 2021-08-20 NOTE — Patient Instructions (Signed)
Janet Leblanc , ?Thank you for taking time to come for your Medicare Wellness Visit. I appreciate your ongoing commitment to your health goals. Please review the following plan we discussed and let me know if I can assist you in the future.  ? ?Screening recommendations/referrals: ?Colonoscopy: 06/20/2019; due every 10 years ?Mammogram: 09/04/2020; due every year ?Bone Density: never done ?Recommended yearly ophthalmology/optometry visit for glaucoma screening and checkup ?Recommended yearly dental visit for hygiene and checkup ? ?Vaccinations: ?Influenza vaccine: 01/2021 ?Pneumococcal vaccine: 05/13/2012 ?Tdap vaccine: 05/13/2012; due every 10 years ?Shingles vaccine: no record   ?Covid-19: 06/17/2019, 07/08/2019, 02/07/2020, 03/20/2021 ? ?Advanced directives: Yes; Please bring a copy of your health care power of attorney and living will to the office at your convenience. ? ?Conditions/risks identified: Yes ? ?Next appointment: Please schedule your next Medicare Wellness Visit with your Nurse Health Advisor in 1 year by calling [phone]. ? ? ?Preventive Care 49 Years and Older, Female ?Preventive care refers to lifestyle choices and visits with your health care provider that can promote health and wellness. ?What does preventive care include? ?A yearly physical exam. This is also called an annual well check. ?Dental exams once or twice a year. ?Routine eye exams. Ask your health care provider how often you should have your eyes checked. ?Personal lifestyle choices, including: ?Daily care of your teeth and gums. ?Regular physical activity. ?Eating a healthy diet. ?Avoiding tobacco and drug use. ?Limiting alcohol use. ?Practicing safe sex. ?Taking low-dose aspirin every day. ?Taking vitamin and mineral supplements as recommended by your health care provider. ?What happens during an annual well check? ?The services and screenings done by your health care provider during your annual well check will depend on your age, overall  health, lifestyle risk factors, and family history of disease. ?Counseling  ?Your health care provider may ask you questions about your: ?Alcohol use. ?Tobacco use. ?Drug use. ?Emotional well-being. ?Home and relationship well-being. ?Sexual activity. ?Eating habits. ?History of falls. ?Memory and ability to understand (cognition). ?Work and work Statistician. ?Reproductive health. ?Screening  ?You may have the following tests or measurements: ?Height, weight, and BMI. ?Blood pressure. ?Lipid and cholesterol levels. These may be checked every 5 years, or more frequently if you are over 60 years old. ?Skin check. ?Lung cancer screening. You may have this screening every year starting at age 30 if you have a 30-pack-year history of smoking and currently smoke or have quit within the past 15 years. ?Fecal occult blood test (FOBT) of the stool. You may have this test every year starting at age 40. ?Flexible sigmoidoscopy or colonoscopy. You may have a sigmoidoscopy every 5 years or a colonoscopy every 10 years starting at age 62. ?Hepatitis C blood test. ?Hepatitis B blood test. ?Sexually transmitted disease (STD) testing. ?Diabetes screening. This is done by checking your blood sugar (glucose) after you have not eaten for a while (fasting). You may have this done every 1-3 years. ?Bone density scan. This is done to screen for osteoporosis. You may have this done starting at age 32. ?Mammogram. This may be done every 1-2 years. Talk to your health care provider about how often you should have regular mammograms. ?Talk with your health care provider about your test results, treatment options, and if necessary, the need for more tests. ?Vaccines  ?Your health care provider may recommend certain vaccines, such as: ?Influenza vaccine. This is recommended every year. ?Tetanus, diphtheria, and acellular pertussis (Tdap, Td) vaccine. You may need a Td booster every 10 years. ?  Zoster vaccine. You may need this after age  3. ?Pneumococcal 13-valent conjugate (PCV13) vaccine. One dose is recommended after age 66. ?Pneumococcal polysaccharide (PPSV23) vaccine. One dose is recommended after age 37. ?Talk to your health care provider about which screenings and vaccines you need and how often you need them. ?This information is not intended to replace advice given to you by your health care provider. Make sure you discuss any questions you have with your health care provider. ?Document Released: 04/20/2015 Document Revised: 12/12/2015 Document Reviewed: 01/23/2015 ?Elsevier Interactive Patient Education ? 2017 [employer]. ? ?Fall Prevention in the Home ?Falls can cause injuries. They can happen to people of all ages. There are many things you can do to make your home safe and to help prevent falls. ?What can I do on the outside of my home? ?Regularly fix the edges of walkways and driveways and fix any cracks. ?Remove anything that might make you trip as you walk through a door, such as a raised step or threshold. ?Trim any bushes or trees on the path to your home. ?Use bright outdoor lighting. ?Clear any walking paths of anything that might make someone trip, such as rocks or tools. ?Regularly check to see if handrails are loose or broken. Make sure that both sides of any steps have handrails. ?Any raised decks and porches should have guardrails on the edges. ?Have any leaves, snow, or ice cleared regularly. ?Use sand or salt on walking paths during winter. ?Clean up any spills in your garage right away. This includes oil or grease spills. ?What can I do in the bathroom? ?Use night lights. ?Install grab bars by the toilet and in the tub and shower. Do not use towel bars as grab bars. ?Use non-skid mats or decals in the tub or shower. ?If you need to sit down in the shower, use a plastic, non-slip stool. ?Keep the floor dry. Clean up any water that spills on the floor as soon as it happens. ?Remove soap buildup in the tub or shower  regularly. ?Attach bath mats securely with double-sided non-slip rug tape. ?Do not have throw rugs and other things on the floor that can make you trip. ?What can I do in the bedroom? ?Use night lights. ?Make sure that you have a light by your bed that is easy to reach. ?Do not use any sheets or blankets that are too big for your bed. They should not hang down onto the floor. ?Have a firm chair that has side arms. You can use this for support while you get dressed. ?Do not have throw rugs and other things on the floor that can make you trip. ?What can I do in the kitchen? ?Clean up any spills right away. ?Avoid walking on wet floors. ?Keep items that you use a lot in easy-to-reach places. ?If you need to reach something above you, use a strong step stool that has a grab bar. ?Keep electrical cords out of the way. ?Do not use floor polish or wax that makes floors slippery. If you must use wax, use non-skid floor wax. ?Do not have throw rugs and other things on the floor that can make you trip. ?What can I do with my stairs? ?Do not leave any items on the stairs. ?Make sure that there are handrails on both sides of the stairs and use them. Fix handrails that are broken or loose. Make sure that handrails are as long as the stairways. ?Check any carpeting to make sure that  it is firmly attached to the stairs. Fix any carpet that is loose or worn. ?Avoid having throw rugs at the top or bottom of the stairs. If you do have throw rugs, attach them to the floor with carpet tape. ?Make sure that you have a light switch at the top of the stairs and the bottom of the stairs. If you do not have them, ask someone to add them for you. ?What else can I do to help prevent falls? ?Wear shoes that: ?Do not have high heels. ?Have rubber bottoms. ?Are comfortable and fit you well. ?Are closed at the toe. Do not wear sandals. ?If you use a stepladder: ?Make sure that it is fully opened. Do not climb a closed stepladder. ?Make sure that  both sides of the stepladder are locked into place. ?Ask someone to hold it for you, if possible. ?Clearly mark and make sure that you can see: ?Any grab bars or handrails. ?First and last steps. ?Where the edge of

## 2021-08-21 ENCOUNTER — Other Ambulatory Visit: Payer: Self-pay | Admitting: Emergency Medicine

## 2021-08-21 ENCOUNTER — Telehealth: Payer: Self-pay | Admitting: Emergency Medicine

## 2021-08-21 DIAGNOSIS — E039 Hypothyroidism, unspecified: Secondary | ICD-10-CM

## 2021-08-21 NOTE — Telephone Encounter (Signed)
Called pharmacy to provider med clarification per provider response .  ?

## 2021-08-21 NOTE — Telephone Encounter (Signed)
Janet Leblanc with [facility] calls today in regards to clarification for PT's RX on ciprofloxacin (CIPRO) 500 mg. PT was prescribed 6 tablets, 1 tablet twice a day, and was not sure if they needed to give PT 8 more tablets (14 for the whole 7 days) or how to proceed. ? ?CB: [phone] ?

## 2021-08-21 NOTE — Telephone Encounter (Signed)
Should be Cipro 500 mg twice a day for 7 days.  Thanks.

## 2021-08-22 LAB — URINALYSIS, ROUTINE W REFLEX MICROSCOPIC
Bilirubin Urine: NEGATIVE
Hgb urine dipstick: NEGATIVE
Ketones, ur: NEGATIVE
Nitrite: NEGATIVE
RBC / HPF: NONE SEEN (ref 0–?)
Specific Gravity, Urine: <=1.005 — AB (ref 1.000–1.030)
Total Protein, Urine: NEGATIVE
Urine Glucose: NEGATIVE
Urobilinogen, UA: 0.2 (ref 0.0–1.0)
pH: 7.5 (ref 5.0–8.0)

## 2021-08-22 LAB — MICROALBUMIN / CREATININE URINE RATIO
Creatinine,U: 33.1 mg/dL
Microalb Creat Ratio: 2.1 mg/g (ref 0.0–30.0)
Microalb, Ur: <0.7 mg/dL (ref 0.0–1.9)

## 2021-08-23 LAB — URINE CULTURE

## 2021-08-24 ENCOUNTER — Other Ambulatory Visit: Payer: Self-pay | Admitting: Nurse Practitioner

## 2021-09-06 ENCOUNTER — Ambulatory Visit: Payer: Medicare Other | Admitting: Podiatry

## 2021-09-06 ENCOUNTER — Ambulatory Visit (INDEPENDENT_AMBULATORY_CARE_PROVIDER_SITE_OTHER): Payer: Medicare Other

## 2021-09-06 DIAGNOSIS — M779 Enthesopathy, unspecified: Secondary | ICD-10-CM | POA: Diagnosis not present

## 2021-09-06 DIAGNOSIS — M7752 Other enthesopathy of left foot: Secondary | ICD-10-CM

## 2021-09-06 DIAGNOSIS — Q828 Other specified congenital malformations of skin: Secondary | ICD-10-CM

## 2021-09-08 NOTE — Progress Notes (Signed)
Subjective:   Patient ID: Janet Leblanc, female   DOB: 71 y.o.   MRN: [id number]   HPI Patient presents with 2 very painful lesions on the right and left foot and also inflammation pain of the left ankle that is been present for around a month   ROS      Objective:  Physical Exam  Neurovascular status intact with inflammation pain of the sinus tarsi left with fluid buildup of the joint and keratotic lesion fifth MPJ bilateral with lucent course     Assessment:  Inflammatory capsulitis of the sinus tarsi ankle joint left along with porokeratotic lesion x2     Plan:  H&P reviewed condition and went ahead did sterile prep injected the sinus tarsi left 3 mg Kenalog 5 mg Xylocaine debrided lesions bilateral no iatrogenic bleeding reappoint routine care

## 2021-10-21 ENCOUNTER — Encounter: Payer: Self-pay | Admitting: Emergency Medicine

## 2021-10-21 ENCOUNTER — Ambulatory Visit: Payer: Medicare Other | Admitting: Podiatry

## 2021-10-21 ENCOUNTER — Encounter: Payer: Self-pay | Admitting: Podiatry

## 2021-10-21 DIAGNOSIS — M21621 Bunionette of right foot: Secondary | ICD-10-CM

## 2021-10-21 DIAGNOSIS — M21622 Bunionette of left foot: Secondary | ICD-10-CM

## 2021-10-21 LAB — HM MAMMOGRAPHY

## 2021-10-21 NOTE — Progress Notes (Signed)
Subjective:   Patient ID: Janet Leblanc, female   DOB: 71 y.o.   MRN: [id number]   HPI Patient presents with a lot of pain around the fifth metatarsal head of both feet and states that this is not getting better anymore with trimming and she knows that she is going to need surgery on this.  States that she wants to do 1 foot at a time and would rather do the left foot first   ROS      Objective:  Physical Exam  Neurovascular status found to be intact muscle strength is found to be adequate with keratotic lesion on the fifth metatarsal head of both feet that are very painful when pressed and make walking difficult.  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  I allowed her to read consent form going over all possible complications and alternative treatments and she is opted for surgery will have the left foot done first and will have metatarsal head resection along with excision of plantar neoplasm with wide excision technique.  I did review with her the surgery and its recovery and at this point I went ahead and I did dispense air fracture walker fitting it properly for her educating her on its usage

## 2021-10-23 ENCOUNTER — Telehealth: Payer: Self-pay | Admitting: Urology

## 2021-10-23 NOTE — Telephone Encounter (Signed)
DOS - 10/29/21  MET HEAD RESC. 5TH LEFT --- 29937 EXC. BENIGN 4.5 CM NEOPLASM LEFT --- 11426  [employer] EFFECTIVE DATE - 04/07/21   PLAN DEDUCTIBLE - $0.00 OUT OF POCKET - $3,600.00 W/ $3,524.56 REMAINING COINSURANCE - 0% COPAY - $295.00  PER [employer] WEB SITE FOR CPT CODES 16967 AND 89381 Notification or Prior Authorization is not required for the requested services  Decision ID #:[id number]

## 2021-10-28 MED ORDER — HYDROCODONE-ACETAMINOPHEN 10-325 MG PO TABS: 1.0000 | ORAL_TABLET | Freq: Three times a day (TID) | ORAL | 0 refills | Status: AC | PRN

## 2021-10-28 NOTE — Addendum Note (Signed)
Addended by: Wallene Huh on: 10/28/2021 04:38 PM   Modules accepted: Orders

## 2021-10-29 ENCOUNTER — Encounter: Payer: Self-pay | Admitting: Podiatry

## 2021-10-29 DIAGNOSIS — D2372 Other benign neoplasm of skin of left lower limb, including hip: Secondary | ICD-10-CM | POA: Diagnosis not present

## 2021-10-29 DIAGNOSIS — M65872 Other synovitis and tenosynovitis, left ankle and foot: Secondary | ICD-10-CM | POA: Diagnosis not present

## 2021-10-29 DIAGNOSIS — D492 Neoplasm of unspecified behavior of bone, soft tissue, and skin: Secondary | ICD-10-CM | POA: Diagnosis not present

## 2021-11-04 ENCOUNTER — Ambulatory Visit (INDEPENDENT_AMBULATORY_CARE_PROVIDER_SITE_OTHER): Payer: Medicare Other

## 2021-11-04 ENCOUNTER — Encounter: Payer: Self-pay | Admitting: Podiatry

## 2021-11-04 ENCOUNTER — Ambulatory Visit (INDEPENDENT_AMBULATORY_CARE_PROVIDER_SITE_OTHER): Payer: Medicare Other | Admitting: Podiatry

## 2021-11-04 DIAGNOSIS — Z09 Encounter for follow-up examination after completed treatment for conditions other than malignant neoplasm: Secondary | ICD-10-CM | POA: Diagnosis not present

## 2021-11-04 NOTE — Progress Notes (Signed)
Subjective:   Patient ID: Janet Leblanc, female   DOB: 71 y.o.   MRN: [id number]   HPI Patient states she is doing really well with the left foot knows that she needs to get the right foot infection but like to do that fairly soon   ROS      Objective:  Physical Exam  Neurovascular status intact negative Bevelyn Buckles' sign noted left foot healing well wound edges coapted well with no pain on the lateral side of the foot and good alignment of the fifth digit     Assessment:  Doing well post surgery fifth metatarsal left lesion left     Plan:  X-ray reviewed sterile dressing reapplied continue elevation compression immobilization reappoint 2 weeks suture removal 4 weeks to see me with consideration for correction of the right foot  X-rays indicate satisfactory resection of head of fifth metatarsal left good alignment noted

## 2021-11-18 ENCOUNTER — Encounter: Payer: Medicare Other | Admitting: Podiatry

## 2021-11-20 ENCOUNTER — Other Ambulatory Visit: Payer: Self-pay | Admitting: Emergency Medicine

## 2021-11-20 ENCOUNTER — Encounter: Payer: Self-pay | Admitting: Emergency Medicine

## 2021-11-20 ENCOUNTER — Ambulatory Visit: Payer: Medicare Other | Admitting: Emergency Medicine

## 2021-11-20 VITALS — BP 116/74 | HR 68 | Temp 97.9°F | Ht 67.0 in | Wt 243.1 lb

## 2021-11-20 DIAGNOSIS — I10 Essential (primary) hypertension: Secondary | ICD-10-CM

## 2021-11-20 DIAGNOSIS — E785 Hyperlipidemia, unspecified: Secondary | ICD-10-CM | POA: Diagnosis not present

## 2021-11-20 DIAGNOSIS — E119 Type 2 diabetes mellitus without complications: Secondary | ICD-10-CM | POA: Diagnosis not present

## 2021-11-20 DIAGNOSIS — N1831 Chronic kidney disease, stage 3a: Secondary | ICD-10-CM | POA: Diagnosis not present

## 2021-11-20 DIAGNOSIS — E039 Hypothyroidism, unspecified: Secondary | ICD-10-CM

## 2021-11-20 DIAGNOSIS — E7849 Other hyperlipidemia: Secondary | ICD-10-CM

## 2021-11-20 DIAGNOSIS — R7303 Prediabetes: Secondary | ICD-10-CM

## 2021-11-20 LAB — POCT GLYCOSYLATED HEMOGLOBIN (HGB A1C): Hemoglobin A1C: 6.3 % — AB (ref 4.0–5.6)

## 2021-11-20 MED ORDER — LOSARTAN POTASSIUM 50 MG PO TABS: 50.0000 mg | ORAL_TABLET | Freq: Every day | ORAL | 1 refills | Status: DC

## 2021-11-20 NOTE — Assessment & Plan Note (Signed)
Clinically euthyroid. Lab Results  Component Value Date   TSH 2.22 08/20/2021  Continue Synthroid 50 mcg daily.

## 2021-11-20 NOTE — Assessment & Plan Note (Addendum)
Hemoglobin A1c is 6.3. We will stop metformin. Diet and nutrition discussed

## 2021-11-20 NOTE — Assessment & Plan Note (Signed)
Advised to stay well-hydrated and avoid NSAIDs. ?

## 2021-11-20 NOTE — Assessment & Plan Note (Signed)
Stable.  Diet and nutrition discussed.  Continue rosuvastatin 10 mg daily.  

## 2021-11-20 NOTE — Patient Instructions (Signed)
Stop metformin. Stop hydrochlorothiazide. Start losartan 50 mg daily. Monitor blood pressure readings at home daily for the next several weeks and keep a log. Follow-up in 3 months.  Health Maintenance After Age 70 After age 69, you are at a higher risk for certain long-term diseases and infections as well as injuries from falls. Falls are a major cause of broken bones and head injuries in people who are older than age 40. Getting regular preventive care can help to keep you healthy and well. Preventive care includes getting regular testing and making lifestyle changes as recommended by your health care provider. Talk with your health care provider about: Which screenings and tests you should have. A screening is a test that checks for a disease when you have no symptoms. A diet and exercise plan that is right for you. What should I know about screenings and tests to prevent falls? Screening and testing are the best ways to find a health problem early. Early diagnosis and treatment give you the best chance of managing medical conditions that are common after age 60. Certain conditions and lifestyle choices may make you more likely to have a fall. Your health care provider may recommend: Regular vision checks. Poor vision and conditions such as cataracts can make you more likely to have a fall. If you wear glasses, make sure to get your prescription updated if your vision changes. Medicine review. Work with your health care provider to regularly review all of the medicines you are taking, including over-the-counter medicines. Ask your health care provider about any side effects that may make you more likely to have a fall. Tell your health care provider if any medicines that you take make you feel dizzy or sleepy. Strength and balance checks. Your health care provider may recommend certain tests to check your strength and balance while standing, walking, or changing positions. Foot health exam. Foot  pain and numbness, as well as not wearing proper footwear, can make you more likely to have a fall. Screenings, including: Osteoporosis screening. Osteoporosis is a condition that causes the bones to get weaker and break more easily. Blood pressure screening. Blood pressure changes and medicines to control blood pressure can make you feel dizzy. Depression screening. You may be more likely to have a fall if you have a fear of falling, feel depressed, or feel unable to do activities that you used to do. Alcohol use screening. Using too much alcohol can affect your balance and may make you more likely to have a fall. Follow these instructions at home: Lifestyle Do not drink alcohol if: Your health care provider tells you not to drink. If you drink alcohol: Limit how much you have to: 0-1 drink a day for women. 0-2 drinks a day for men. Know how much alcohol is in your drink. In the [place] one drink equals one 12 oz bottle of beer (355 mL), one 5 oz glass of wine (148 mL), or one 1 oz glass of hard liquor (44 mL). Do not use any products that contain nicotine or tobacco. These products include cigarettes, chewing tobacco, and vaping devices, such as e-cigarettes. If you need help quitting, ask your health care provider. Activity  Follow a regular exercise program to stay fit. This will help you maintain your balance. Ask your health care provider what types of exercise are appropriate for you. If you need a cane or walker, use it as recommended by your health care provider. Wear supportive shoes that have nonskid soles.  Safety  Remove any tripping hazards, such as rugs, cords, and clutter. Install safety equipment such as grab bars in bathrooms and safety rails on stairs. Keep rooms and walkways well-lit. General instructions Talk with your health care provider about your risks for falling. Tell your health care provider if: You fall. Be sure to tell your health care provider about all  falls, even ones that seem minor. You feel dizzy, tiredness (fatigue), or off-balance. Take over-the-counter and prescription medicines only as told by your health care provider. These include supplements. Eat a healthy diet and maintain a healthy weight. A healthy diet includes low-fat dairy products, low-fat (lean) meats, and fiber from whole grains, beans, and lots of fruits and vegetables. Stay current with your vaccines. Schedule regular health, dental, and eye exams. Summary Having a healthy lifestyle and getting preventive care can help to protect your health and wellness after age 104. Screening and testing are the best way to find a health problem early and help you avoid having a fall. Early diagnosis and treatment give you the best chance for managing medical conditions that are more common for people who are older than age 47. Falls are a major cause of broken bones and head injuries in people who are older than age 56. Take precautions to prevent a fall at home. Work with your health care provider to learn what changes you can make to improve your health and wellness and to prevent falls. This information is not intended to replace advice given to you by your health care provider. Make sure you discuss any questions you have with your health care provider. Document Revised: 08/13/2020 Document Reviewed: 08/13/2020 Elsevier Patient Education  [place].

## 2021-11-20 NOTE — Assessment & Plan Note (Addendum)
Well-controlled hypertension. Presently on hydrochlorothiazide 25 mg daily. Having chronic urinary frequency and nocturia. Recommend to stop hydrochlorothiazide and start losartan 50 mg daily. Also has history of hypokalemia. BP Readings from Last 3 Encounters:  11/20/21 116/74  08/20/21 124/78  08/20/21 124/82

## 2021-11-20 NOTE — Progress Notes (Signed)
Janet Leblanc 71 y.o.   Chief Complaint  Patient presents with   Follow-up    3 mnth f/u appt ,     HISTORY OF PRESENT ILLNESS: This is a 71 y.o. female here for 1-monthfollow-up of hypertension, dyslipidemia, prediabetes and hypothyroidism. Lab Results  Component Value Date   HGBA1C 6.3 08/20/2021   BP Readings from Last 3 Encounters:  11/20/21 116/74  08/20/21 124/78  08/20/21 124/82     HPI   Prior to Admission medications   Medication Sig Start Date End Date Taking? Authorizing Provider  aspirin 81 MG tablet Take 81 mg by mouth daily.   Yes [provider]  Cholecalciferol (VITAMIN D3) 50 MCG (2000 UT) capsule Take 1 capsule (2,000 Units total) by mouth daily. 02/28/20  Yes Just, KLaurita Quint FNP  hydrochlorothiazide (HYDRODIURIL) 25 MG tablet TAKE 1 TABLET BY MOUTH  DAILY 02/17/21  Yes Nickolai Rinks, MInes Bloomer MD  levothyroxine (SYNTHROID) 50 MCG tablet TAKE 1 TABLET BY MOUTH EVERY  OTHER DAY 08/22/21  Yes Willistine Ferrall, MInes Bloomer MD  Magnesium 500 MG CAPS Take 500 mg by mouth daily.   Yes [provider]  Multiple Vitamin (MULTI VITAMIN DAILY PO) Take 1 tablet by mouth.   Yes [provider]  NON FBerniece Papbladder support   Yes [provider]  omeprazole (PRILOSEC) 20 MG capsule Take 1 capsule (20 mg total) by mouth every other day. 07/04/21  Yes GWillia Craze NP  POTASSIUM PO Take 1 tablet by mouth as needed.   Yes [provider]  rosuvastatin (CRESTOR) 10 MG tablet TAKE 1 TABLET BY MOUTH  DAILY 02/17/21  Yes SHorald Pollen MD  metFORMIN (GLUCOPHAGE-XR) 500 MG 24 hr tablet Take 1 tablet (500 mg total) by mouth daily with breakfast. 03/13/21 07/04/21  SHorald Pollen MD    Allergies  Allergen Reactions   Amoxicillin Rash   Penicillins Swelling    Patient Active Problem List   Diagnosis Date Noted   Urinary retention 08/20/2021   Dyslipidemia 08/20/2021   Prediabetes 02/14/2021   Chronic venous  insufficiency 02/14/2021   Recurrent UTI 02/14/2021   Hypothyroidism 02/14/2021   Class 2 severe obesity due to excess calories with serious comorbidity and body mass index (BMI) of 39.0 to 39.9 in adult ([facility] 02/14/2021   Type 2 diabetes mellitus without complication, without long-term current use of insulin ([place] 02/28/2020   Other hyperlipidemia 02/28/2020   Vitamin D deficiency 02/28/2020   Essential hypertension 03/24/2016   Vertigo of central origin, unspecified laterality 06/08/2014   Tinnitus of both ears 06/08/2014    Past Medical History:  Diagnosis Date   Allergy    seasonal   Arthritis    Hypertension    Thyroid disease    Vertigo     Past Surgical History:  Procedure Laterality Date   CHOLECYSTECTOMY     COLONOSCOPY  06/20/2019   FOOT SURGERY     GASTRIC BYPASS     15 years ago at least   TUBAL LIGATION      Social History   Socioeconomic History   Marital status: Married    Spouse name: Therron   Number of children: 2   Years of education: 12   Highest education level: Not on file  Occupational History   Occupation: HAnimator  Tobacco Use   Smoking status: Former    Years: 15.00    Types: Cigarettes    Quit date: 1990    Years  since quitting: 33.6   Smokeless tobacco: Never  Vaping Use   Vaping Use: Never used  Substance and Sexual Activity   Alcohol use: No    Alcohol/week: 0.0 standard drinks of alcohol   Drug use: No   Sexual activity: Not on file  Other Topics Concern   Not on file  Social History Narrative   Not on file   Social Determinants of Health   Financial Resource Strain: Low Risk  (08/20/2021)   Overall Financial Resource Strain (CARDIA)    Difficulty of Paying Living Expenses: Not hard at all  Food Insecurity: No Food Insecurity (08/20/2021)   Hunger Vital Sign    Worried About Running Out of Food in the Last Year: Never true    Ran Out of Food in the Last Year: Never true  Transportation Needs: No Transportation  Needs (08/20/2021)   PRAPARE - Hydrologist (Medical): No    Lack of Transportation (Non-Medical): No  Physical Activity: Inactive (08/20/2021)   Exercise Vital Sign    Days of Exercise per Week: 0 days    Minutes of Exercise per Session: 0 min  Stress: No Stress Concern Present (08/20/2021)   [place]    Feeling of Stress : Not at all  Social Connections: [place] (08/20/2021)   Social Connection and Isolation Panel [NHANES]    Frequency of Communication with Friends and Family: More than three times a week    Frequency of Social Gatherings with Friends and Family: More than three times a week    Attends Religious Services: More than 4 times per year    Active Member of [employer] or Organizations: Yes    Attends Music therapist: More than 4 times per year    Marital Status: Married  Human resources officer Violence: Not At Risk (08/20/2021)   Humiliation, Afraid, Rape, and Kick questionnaire    Fear of Current or Ex-Partner: No    Emotionally Abused: No    Physically Abused: No    Sexually Abused: No    Family History  Problem Relation Age of Onset   Heart disease Father    Hypertension Father    Atrial fibrillation Sister    Heart disease Brother    Stroke Brother    Throat cancer Brother    Stroke Brother    Heart disease Paternal Grandmother    Cancer Paternal Grandfather    Migraines Neg Hx    Colon polyps Neg Hx    Colon cancer Neg Hx    Esophageal cancer Neg Hx    Rectal cancer Neg Hx    Stomach cancer Neg Hx      Review of Systems  Constitutional: Negative.  Negative for chills and fever.  HENT: Negative.  Negative for congestion and sore throat.   Respiratory: Negative.  Negative for cough and shortness of breath.   Cardiovascular: Negative.  Negative for chest pain and palpitations.  Gastrointestinal:  Negative for abdominal pain, nausea and vomiting.   Genitourinary:  Positive for frequency and urgency.       Nocturia  Musculoskeletal: Negative.   Skin: Negative.  Negative for rash.  Neurological:  Negative for dizziness and headaches.  All other systems reviewed and are negative.  Today's Vitals   11/20/21 1421  BP: 116/74  Pulse: 68  Temp: 97.9 F (36.6 C)  TempSrc: Oral  SpO2: 95%  Weight: 243 lb 2 oz (110.3 kg)  Height:  $'5\' 7"'G$  (1.702 m)   Body mass index is 38.08 kg/m. Wt Readings from Last 3 Encounters:  11/20/21 243 lb 2 oz (110.3 kg)  08/20/21 241 lb 9.6 oz (109.6 kg)  08/20/21 241 lb 6 oz (109.5 kg)     Physical Exam Vitals reviewed.  Constitutional:      Appearance: Normal appearance.  HENT:     Head: Normocephalic.     Mouth/Throat:     Mouth: Mucous membranes are moist.     Pharynx: Oropharynx is clear.  Eyes:     Extraocular Movements: Extraocular movements intact.     Conjunctiva/sclera: Conjunctivae normal.     Pupils: Pupils are equal, round, and reactive to light.  Cardiovascular:     Rate and Rhythm: Normal rate and regular rhythm.     Pulses: Normal pulses.     Heart sounds: Normal heart sounds.  Pulmonary:     Effort: Pulmonary effort is normal.     Breath sounds: Normal breath sounds.  Musculoskeletal:     Cervical back: No tenderness.     Right lower leg: No edema.     Left lower leg: No edema.  Lymphadenopathy:     Cervical: No cervical adenopathy.  Skin:    General: Skin is warm and dry.     Capillary Refill: Capillary refill takes less than 2 seconds.     Comments: Varicose veins lower extremities  Neurological:     General: No focal deficit present.     Mental Status: She is alert and oriented to person, place, and time.  Psychiatric:        Mood and Affect: Mood normal.        Behavior: Behavior normal.    Results for orders placed or performed in visit on 11/20/21 (from the past 24 hour(s))  POCT HgB A1C     Status: Abnormal   Collection Time: 11/20/21  2:45 PM  Result  Value Ref Range   Hemoglobin A1C 6.3 (A) 4.0 - 5.6 %   HbA1c POC (<> result, manual entry)     HbA1c, POC (prediabetic range)     HbA1c, POC (controlled diabetic range)       ASSESSMENT & PLAN: A total of 46 minutes was spent with the patient and counseling/coordination of care regarding preparing for this visit, review of most recent office visit notes, review of multiple chronic medical problems and their management, review of all medications and changes made, review of most recent blood work results and today's interpretation of hemoglobin A1c, education on nutrition, prognosis, cardiovascular risks associated with hypertension and dyslipidemia, documentation and need for follow-up.   Problem List Items Addressed This Visit       Cardiovascular and Mediastinum   Essential hypertension - Primary    Well-controlled hypertension. Presently on hydrochlorothiazide 25 mg daily. Having chronic urinary frequency and nocturia. Recommend to stop hydrochlorothiazide and start losartan 50 mg daily. Also has history of hypokalemia. BP Readings from Last 3 Encounters:  11/20/21 116/74  08/20/21 124/78  08/20/21 124/82         Relevant Medications   losartan (COZAAR) 50 MG tablet     Endocrine   Type 2 diabetes mellitus without complication, without long-term current use of insulin (HCC)   Relevant Medications   losartan (COZAAR) 50 MG tablet   Other Relevant Orders   POCT HgB A1C (Completed)   Hypothyroidism    Clinically euthyroid. Lab Results  Component Value Date   TSH 2.22 08/20/2021  Continue  Synthroid 50 mcg daily.         Genitourinary   Stage 3a chronic kidney disease ([place])    Advised to stay well-hydrated and avoid NSAIDs.        Other   Prediabetes    Hemoglobin A1c is 6.3. We will stop metformin. Diet and nutrition discussed       Dyslipidemia    Stable.  Diet and nutrition discussed. Continue rosuvastatin 10 mg daily.       Patient Instructions   Stop metformin. Stop hydrochlorothiazide. Start losartan 50 mg daily. Monitor blood pressure readings at home daily for the next several weeks and keep a log. Follow-up in 3 months.  Health Maintenance After Age 40 After age 38, you are at a higher risk for certain long-term diseases and infections as well as injuries from falls. Falls are a major cause of broken bones and head injuries in people who are older than age 6. Getting regular preventive care can help to keep you healthy and well. Preventive care includes getting regular testing and making lifestyle changes as recommended by your health care provider. Talk with your health care provider about: Which screenings and tests you should have. A screening is a test that checks for a disease when you have no symptoms. A diet and exercise plan that is right for you. What should I know about screenings and tests to prevent falls? Screening and testing are the best ways to find a health problem early. Early diagnosis and treatment give you the best chance of managing medical conditions that are common after age 56. Certain conditions and lifestyle choices may make you more likely to have a fall. Your health care provider may recommend: Regular vision checks. Poor vision and conditions such as cataracts can make you more likely to have a fall. If you wear glasses, make sure to get your prescription updated if your vision changes. Medicine review. Work with your health care provider to regularly review all of the medicines you are taking, including over-the-counter medicines. Ask your health care provider about any side effects that may make you more likely to have a fall. Tell your health care provider if any medicines that you take make you feel dizzy or sleepy. Strength and balance checks. Your health care provider may recommend certain tests to check your strength and balance while standing, walking, or changing positions. Foot health exam. Foot  pain and numbness, as well as not wearing proper footwear, can make you more likely to have a fall. Screenings, including: Osteoporosis screening. Osteoporosis is a condition that causes the bones to get weaker and break more easily. Blood pressure screening. Blood pressure changes and medicines to control blood pressure can make you feel dizzy. Depression screening. You may be more likely to have a fall if you have a fear of falling, feel depressed, or feel unable to do activities that you used to do. Alcohol use screening. Using too much alcohol can affect your balance and may make you more likely to have a fall. Follow these instructions at home: Lifestyle Do not drink alcohol if: Your health care provider tells you not to drink. If you drink alcohol: Limit how much you have to: 0-1 drink a day for women. 0-2 drinks a day for men. Know how much alcohol is in your drink. In the [place] one drink equals one 12 oz bottle of beer (355 mL), one 5 oz glass of wine (148 mL), or one 1 oz glass of hard  liquor (44 mL). Do not use any products that contain nicotine or tobacco. These products include cigarettes, chewing tobacco, and vaping devices, such as e-cigarettes. If you need help quitting, ask your health care provider. Activity  Follow a regular exercise program to stay fit. This will help you maintain your balance. Ask your health care provider what types of exercise are appropriate for you. If you need a cane or walker, use it as recommended by your health care provider. Wear supportive shoes that have nonskid soles. Safety  Remove any tripping hazards, such as rugs, cords, and clutter. Install safety equipment such as grab bars in bathrooms and safety rails on stairs. Keep rooms and walkways well-lit. General instructions Talk with your health care provider about your risks for falling. Tell your health care provider if: You fall. Be sure to tell your health care provider about all  falls, even ones that seem minor. You feel dizzy, tiredness (fatigue), or off-balance. Take over-the-counter and prescription medicines only as told by your health care provider. These include supplements. Eat a healthy diet and maintain a healthy weight. A healthy diet includes low-fat dairy products, low-fat (lean) meats, and fiber from whole grains, beans, and lots of fruits and vegetables. Stay current with your vaccines. Schedule regular health, dental, and eye exams. Summary Having a healthy lifestyle and getting preventive care can help to protect your health and wellness after age 25. Screening and testing are the best way to find a health problem early and help you avoid having a fall. Early diagnosis and treatment give you the best chance for managing medical conditions that are more common for people who are older than age 42. Falls are a major cause of broken bones and head injuries in people who are older than age 34. Take precautions to prevent a fall at home. Work with your health care provider to learn what changes you can make to improve your health and wellness and to prevent falls. This information is not intended to replace advice given to you by your health care provider. Make sure you discuss any questions you have with your health care provider. Document Revised: 08/13/2020 Document Reviewed: 08/13/2020 Elsevier Patient Education  [place], MD [facility]

## 2021-11-21 ENCOUNTER — Other Ambulatory Visit: Payer: Self-pay | Admitting: Emergency Medicine

## 2021-11-21 ENCOUNTER — Telehealth: Payer: Self-pay

## 2021-11-21 ENCOUNTER — Ambulatory Visit (INDEPENDENT_AMBULATORY_CARE_PROVIDER_SITE_OTHER): Payer: Medicare Other | Admitting: Podiatry

## 2021-11-21 ENCOUNTER — Ambulatory Visit (INDEPENDENT_AMBULATORY_CARE_PROVIDER_SITE_OTHER): Payer: Medicare Other

## 2021-11-21 ENCOUNTER — Encounter: Payer: Self-pay | Admitting: Podiatry

## 2021-11-21 DIAGNOSIS — Z9889 Other specified postprocedural states: Secondary | ICD-10-CM

## 2021-11-21 DIAGNOSIS — M21621 Bunionette of right foot: Secondary | ICD-10-CM | POA: Diagnosis not present

## 2021-11-21 DIAGNOSIS — I10 Essential (primary) hypertension: Secondary | ICD-10-CM

## 2021-11-21 NOTE — Telephone Encounter (Signed)
DOS 12/03/2021  METATARSAL HEAD RESECTION 5TH RT - 28113 EXC BENIGN LESION RT - [place]  Notification or Prior Authorization is not required for the requested services  Decision ID #:[id number]

## 2021-11-22 NOTE — Progress Notes (Signed)
Subjective:   Patient ID: Janet Leblanc, female   DOB: 71 y.o.   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  I reviewed with her at great length that just because 1 foot did so well no guarantee that the other 1 will and reviewed all possible complications associated with surgery.  Patient is willing to accept risk wants surgery signed consent form scheduled outpatient surgery right foot

## 2021-12-02 MED ORDER — HYDROCODONE-ACETAMINOPHEN 10-325 MG PO TABS: 1.0000 | ORAL_TABLET | Freq: Three times a day (TID) | ORAL | 0 refills | Status: AC | PRN

## 2021-12-02 NOTE — Addendum Note (Signed)
Addended by: Wallene Huh on: 12/02/2021 05:07 PM   Modules accepted: Orders

## 2021-12-03 ENCOUNTER — Encounter: Payer: Self-pay | Admitting: Podiatry

## 2021-12-03 ENCOUNTER — Other Ambulatory Visit: Payer: Self-pay | Admitting: Emergency Medicine

## 2021-12-03 DIAGNOSIS — D492 Neoplasm of unspecified behavior of bone, soft tissue, and skin: Secondary | ICD-10-CM | POA: Diagnosis not present

## 2021-12-03 DIAGNOSIS — D2371 Other benign neoplasm of skin of right lower limb, including hip: Secondary | ICD-10-CM | POA: Diagnosis not present

## 2021-12-03 DIAGNOSIS — E119 Type 2 diabetes mellitus without complications: Secondary | ICD-10-CM

## 2021-12-03 DIAGNOSIS — M21621 Bunionette of right foot: Secondary | ICD-10-CM | POA: Diagnosis not present

## 2021-12-03 DIAGNOSIS — M2011 Hallux valgus (acquired), right foot: Secondary | ICD-10-CM | POA: Diagnosis not present

## 2021-12-11 ENCOUNTER — Ambulatory Visit (INDEPENDENT_AMBULATORY_CARE_PROVIDER_SITE_OTHER): Payer: Medicare Other | Admitting: Podiatry

## 2021-12-11 ENCOUNTER — Encounter: Payer: Self-pay | Admitting: Podiatry

## 2021-12-11 ENCOUNTER — Ambulatory Visit (INDEPENDENT_AMBULATORY_CARE_PROVIDER_SITE_OTHER): Payer: Medicare Other

## 2021-12-11 DIAGNOSIS — Z9889 Other specified postprocedural states: Secondary | ICD-10-CM | POA: Diagnosis not present

## 2021-12-12 NOTE — Progress Notes (Signed)
Subjective:   Patient ID: Janet Leblanc, female   DOB: 71 y.o.   MRN: [id number]   HPI Patient states she continues to improve very satisfied now with the surgery on her right foot   ROS      Objective:  Physical Exam  Neuro vascular status intact negative Bevelyn Buckles' sign noted wound edges right well-healed stitches intact     Assessment:  We will post fifth metatarsal head resection right and excision of plantar lesion right benign neoplasm     Plan:  Sterile dressing applied continue elevation compression immobilization reappoint 2 weeks suture removal and will not need to be seen after that suture removal  X-rays indicate adequate removal of bone good positional component noted

## 2021-12-25 ENCOUNTER — Encounter: Payer: Self-pay | Admitting: Podiatry

## 2021-12-25 ENCOUNTER — Ambulatory Visit (INDEPENDENT_AMBULATORY_CARE_PROVIDER_SITE_OTHER): Payer: Medicare Other

## 2021-12-25 ENCOUNTER — Ambulatory Visit (INDEPENDENT_AMBULATORY_CARE_PROVIDER_SITE_OTHER): Payer: Medicare Other | Admitting: Podiatry

## 2021-12-25 DIAGNOSIS — Z9889 Other specified postprocedural states: Secondary | ICD-10-CM

## 2021-12-25 MED ORDER — DOXYCYCLINE HYCLATE 100 MG PO TABS: 100.0000 mg | ORAL_TABLET | Freq: Two times a day (BID) | ORAL | 1 refills | Status: DC

## 2021-12-25 NOTE — Progress Notes (Signed)
Subjective:   Patient ID: Janet Leblanc, female   DOB: 71 y.o.   MRN: [id number]   HPI Patient states overall doing well but I do have some redness around the outside of the right foot and its been sore but I do not noted drainage   ROS      Objective:  Physical Exam  Ocular status intact negative Bevelyn Buckles' sign noted there is mild redness around the incision sites right lateral foot dorsal and plantar with no drainage noted crusted tissue and stitches removed with no drainage noted.  No proximal edema erythema drainage noted     Assessment:  Possibility for low-grade like cellulitis or other process occurring around the incision site right lateral foot     Plan:  H&P conditions reviewed and I placed on antibiotic doxycycline at the time and gave strict instructions of any increased erythema edema or any drainage were to occur to let us know immediately and hopefully this will heal uneventfully.  Patient will be seen back to recheck is encouraged to call questions concerns  X-rays indicate that the bone has been satisfactory resected no indications of lysis or other pathology

## 2022-01-26 ENCOUNTER — Other Ambulatory Visit: Payer: Self-pay | Admitting: Emergency Medicine

## 2022-01-26 DIAGNOSIS — I10 Essential (primary) hypertension: Secondary | ICD-10-CM

## 2022-02-04 ENCOUNTER — Encounter (HOSPITAL_COMMUNITY): Payer: Self-pay | Admitting: *Deleted

## 2022-02-04 ENCOUNTER — Ambulatory Visit (HOSPITAL_COMMUNITY)
Admission: EM | Admit: 2022-02-04 | Discharge: 2022-02-04 | Disposition: A | Discharge status: Home / Self Care | Payer: Medicare Other | Attending: Family Medicine | Admitting: Family Medicine

## 2022-02-04 DIAGNOSIS — J069 Acute upper respiratory infection, unspecified: Secondary | ICD-10-CM

## 2022-02-04 MED ORDER — METHYLPREDNISOLONE 4 MG PO TBPK: ORAL_TABLET | ORAL | 0 refills | Status: DC

## 2022-02-04 MED ORDER — BENZONATATE 100 MG PO CAPS: 100.0000 mg | ORAL_CAPSULE | Freq: Three times a day (TID) | ORAL | 0 refills | Status: DC

## 2022-02-04 NOTE — ED Provider Notes (Addendum)
[place]    CSN: [id number] Arrival date & time: 02/04/22  1110      History   Chief Complaint Chief Complaint  Patient presents with   Cough   Nasal Congestion    HPI Janet Leblanc is a 71 y.o. female.   Patient is here for uri symptoms x 4 weeks.  She used otc cough/cold medications without help, as well as allergy meds without help.  Having a cough, some clear sputum.  She feels that she is wheezing at night.  No runny nose, congestion.  No fevers/chills.  She has felt well otherwise.       Past Medical History:  Diagnosis Date   Allergy    seasonal   Arthritis    Hypertension    Thyroid disease    Vertigo     Patient Active Problem List   Diagnosis Date Noted   Stage 3a chronic kidney disease ([place]) 11/20/2021   Urinary retention 08/20/2021   Dyslipidemia 08/20/2021   Prediabetes 02/14/2021   Chronic venous insufficiency 02/14/2021   Recurrent UTI 02/14/2021   Hypothyroidism 02/14/2021   Class 2 severe obesity due to excess calories with serious comorbidity and body mass index (BMI) of 39.0 to 39.9 in adult [facility]) 02/14/2021   Type 2 diabetes mellitus without complication, without long-term current use of insulin ([place]) 02/28/2020   Other hyperlipidemia 02/28/2020   Vitamin D deficiency 02/28/2020   Essential hypertension 03/24/2016   Vertigo of central origin, unspecified laterality 06/08/2014   Tinnitus of both ears 06/08/2014    Past Surgical History:  Procedure Laterality Date   CHOLECYSTECTOMY     COLONOSCOPY  06/20/2019   FOOT SURGERY     GASTRIC BYPASS     15 years ago at least   [name]      OB History   No obstetric history on file.      Home Medications    Prior to Admission medications   Medication Sig Start Date End Date Taking? Authorizing Provider  aspirin 81 MG tablet Take 81 mg by mouth daily.   Yes [provider]  Cholecalciferol (VITAMIN D3) 50 MCG (2000 UT) capsule Take 1 capsule (2,000  Units total) by mouth daily. 02/28/20  Yes Just, Laurita Quint, FNP  levothyroxine (SYNTHROID) 50 MCG tablet TAKE 1 TABLET BY MOUTH EVERY  OTHER DAY 08/22/21  Yes Sagardia, Ines Bloomer, MD  losartan (COZAAR) 50 MG tablet Take 1 tablet (50 mg total) by mouth daily. 11/20/21  Yes Sagardia, Ines Bloomer, MD  Magnesium 500 MG CAPS Take 500 mg by mouth daily.   Yes [provider]  Multiple Vitamin (MULTI VITAMIN DAILY PO) Take 1 tablet by mouth.   Yes [provider]  omeprazole (PRILOSEC) 20 MG capsule Take 1 capsule (20 mg total) by mouth every other day. 07/04/21  Yes Willia Craze, NP  POTASSIUM PO Take 1 tablet by mouth as needed.   Yes [provider]  rosuvastatin (CRESTOR) 10 MG tablet TAKE 1 TABLET BY MOUTH DAILY 11/21/21  Yes Sagardia, Ines Bloomer, MD  doxycycline (VIBRA-TABS) 100 MG tablet Take 1 tablet (100 mg total) by mouth 2 (two) times daily. 12/25/21   Wallene Huh, DPM  NON Berniece Pap bladder support    [provider]    Family History Family History  Problem Relation Age of Onset   Heart disease Father    Hypertension Father    Atrial fibrillation Sister    Heart disease Brother  Stroke Brother    Throat cancer Brother    Stroke Brother    Heart disease Paternal Grandmother    Cancer Paternal Grandfather    Migraines Neg Hx    Colon polyps Neg Hx    Colon cancer Neg Hx    Esophageal cancer Neg Hx    Rectal cancer Neg Hx    Stomach cancer Neg Hx     Social History Social History   Tobacco Use   Smoking status: Former    Years: 15.00    Types: Cigarettes    Quit date: 1990    Years since quitting: 33.8   Smokeless tobacco: Never  Vaping Use   Vaping Use: Never used  Substance Use Topics   Alcohol use: No    Alcohol/week: 0.0 standard drinks of alcohol   Drug use: No     Allergies   Amoxicillin and Penicillins   Review of Systems Review of Systems  Constitutional: Negative.   HENT: Negative.     Respiratory:  Positive for cough and wheezing.   Gastrointestinal: Negative.   Genitourinary: Negative.   Musculoskeletal: Negative.   Psychiatric/Behavioral: Negative.       Physical Exam Triage Vital Signs ED Triage Vitals  Enc Vitals Group     BP 02/04/22 1230 (!) 166/85     Pulse Rate 02/04/22 1230 68     Resp 02/04/22 1230 18     Temp 02/04/22 1230 97.9 F (36.6 C)     Temp Source 02/04/22 1230 Oral     SpO2 02/04/22 1230 96 %     Weight --      Height --      Head Circumference --      Peak Flow --      Pain Score 02/04/22 1228 0     Pain Loc --      Pain Edu? --      Excl. in [place]? --    No data found.  Updated Vital Signs BP (!) 166/85 (BP Location: Left Arm)   Pulse 68   Temp 97.9 F (36.6 C) (Oral)   Resp 18   SpO2 96%   Visual Acuity Right Eye Distance:   Left Eye Distance:   Bilateral Distance:    Right Eye Near:   Left Eye Near:    Bilateral Near:     Physical Exam Constitutional:      Appearance: Normal appearance.  HENT:     Nose: Nose normal.  Cardiovascular:     Rate and Rhythm: Normal rate and regular rhythm.  Pulmonary:     Effort: Pulmonary effort is normal.     Breath sounds: Normal breath sounds.  Musculoskeletal:        General: Normal range of motion.     Cervical back: Normal range of motion and neck supple.  Skin:    General: Skin is warm.  Neurological:     General: No focal deficit present.     Mental Status: She is alert.  Psychiatric:        Mood and Affect: Mood normal.      UC Treatments / Results  Labs (all labs ordered are listed, but only abnormal results are displayed) Labs Reviewed - No data to display  EKG   Radiology No results found.  Procedures Procedures (including critical care time)  Medications Ordered in UC Medications - No data to display  Initial Impression / Assessment and Plan / UC Course  I have reviewed the triage  vital signs and the nursing notes.  Pertinent labs & imaging  results that were available during my care of the patient were reviewed by me and considered in my medical decision making (see chart for details).   Final Clinical Impressions(s) / UC Diagnoses   Final diagnoses:  Viral URI with cough     Discharge Instructions      You were seen today for cough.  I have sent out a cough pill and a steroid pack to help with the cough.  If this does not improve then please return for re-evaluation.     ED Prescriptions     Medication Sig Dispense Auth. Provider   benzonatate (TESSALON) 100 MG capsule Take 1 capsule (100 mg total) by mouth every 8 (eight) hours. 21 capsule Alene Bergerson, MD   methylPREDNISolone (MEDROL DOSEPAK) 4 MG TBPK tablet Take as directed 1 each Rondel Oh, MD      PDMP not reviewed this encounter.   Rondel Oh, MD 02/04/22 1257    Rondel Oh, MD 02/04/22 1354

## 2022-02-04 NOTE — Discharge Instructions (Signed)
You were seen today for cough.  I have sent out a cough pill and a steroid pack to help with the cough.  If this does not improve then please return for re-evaluation.

## 2022-02-04 NOTE — ED Triage Notes (Signed)
Pt states that she has a cough and congestion x 3 weeks, she has been taking OTC cough drops, cold and cough meds.

## 2022-02-06 ENCOUNTER — Other Ambulatory Visit: Payer: Self-pay | Admitting: Emergency Medicine

## 2022-02-06 DIAGNOSIS — E119 Type 2 diabetes mellitus without complications: Secondary | ICD-10-CM

## 2022-02-13 ENCOUNTER — Encounter: Payer: Self-pay | Admitting: Emergency Medicine

## 2022-02-13 ENCOUNTER — Ambulatory Visit (INDEPENDENT_AMBULATORY_CARE_PROVIDER_SITE_OTHER): Payer: Medicare Other | Admitting: Emergency Medicine

## 2022-02-13 ENCOUNTER — Ambulatory Visit (INDEPENDENT_AMBULATORY_CARE_PROVIDER_SITE_OTHER): Payer: Medicare Other

## 2022-02-13 VITALS — BP 124/76 | HR 72 | Temp 98.1°F | Ht 67.0 in | Wt 251.1 lb

## 2022-02-13 DIAGNOSIS — R051 Acute cough: Secondary | ICD-10-CM | POA: Diagnosis not present

## 2022-02-13 DIAGNOSIS — R7303 Prediabetes: Secondary | ICD-10-CM

## 2022-02-13 DIAGNOSIS — E039 Hypothyroidism, unspecified: Secondary | ICD-10-CM

## 2022-02-13 DIAGNOSIS — I10 Essential (primary) hypertension: Secondary | ICD-10-CM | POA: Diagnosis not present

## 2022-02-13 DIAGNOSIS — R059 Cough, unspecified: Secondary | ICD-10-CM | POA: Diagnosis not present

## 2022-02-13 DIAGNOSIS — J22 Unspecified acute lower respiratory infection: Secondary | ICD-10-CM

## 2022-02-13 DIAGNOSIS — N1831 Chronic kidney disease, stage 3a: Secondary | ICD-10-CM | POA: Diagnosis not present

## 2022-02-13 DIAGNOSIS — E785 Hyperlipidemia, unspecified: Secondary | ICD-10-CM

## 2022-02-13 MED ORDER — BENZONATATE 200 MG PO CAPS: 200.0000 mg | ORAL_CAPSULE | Freq: Two times a day (BID) | ORAL | 0 refills | Status: DC | PRN

## 2022-02-13 MED ORDER — HYDROCODONE BIT-HOMATROP MBR 5-1.5 MG/5ML PO SOLN: 5.0000 mL | Freq: Every evening | ORAL | 0 refills | Status: DC | PRN

## 2022-02-13 MED ORDER — AZITHROMYCIN 250 MG PO TABS: ORAL_TABLET | ORAL | 0 refills | Status: DC

## 2022-02-13 NOTE — Assessment & Plan Note (Signed)
Advised to stay well-hydrated and avoid NSAIDs. ?

## 2022-02-13 NOTE — Progress Notes (Signed)
Janet Leblanc 71 y.o.   Chief Complaint  Patient presents with   Acute Visit    Ongoing cough x 1 month, was seen at [facility], patient was prescribed antibiotic and benzonatate     HISTORY OF PRESENT ILLNESS: Acute problem visit today. This is a 70 y.o. female complaining of productive cough for 1 month. Seen at urgent care center on 02/04/2022 and prescribed Tessalon and methylprednisolone. Persistent symptoms.  HPI   Prior to Admission medications   Medication Sig Start Date End Date Taking? Authorizing Provider  aspirin 81 MG tablet Take 81 mg by mouth daily.   Yes [provider]  benzonatate (TESSALON) 100 MG capsule Take 1 capsule (100 mg total) by mouth every 8 (eight) hours. 02/04/22  Yes Piontek, Junie Panning, MD  Cholecalciferol (VITAMIN D3) 50 MCG (2000 UT) capsule Take 1 capsule (2,000 Units total) by mouth daily. 02/28/20  Yes Just, Laurita Quint, FNP  levothyroxine (SYNTHROID) 50 MCG tablet TAKE 1 TABLET BY MOUTH EVERY  OTHER DAY 08/22/21  Yes Delmo Matty, Ines Bloomer, MD  losartan (COZAAR) 50 MG tablet Take 1 tablet (50 mg total) by mouth daily. 11/20/21  Yes Kayann Maj, Ines Bloomer, MD  Magnesium 500 MG CAPS Take 500 mg by mouth daily.   Yes [provider]  methylPREDNISolone (MEDROL DOSEPAK) 4 MG TBPK tablet Take as directed 02/04/22  Yes Piontek, Erin, MD  Multiple Vitamin (MULTI VITAMIN DAILY PO) Take 1 tablet by mouth.   Yes [provider]  omeprazole (PRILOSEC) 20 MG capsule Take 1 capsule (20 mg total) by mouth every other day. 07/04/21  Yes Willia Craze, NP  POTASSIUM PO Take 1 tablet by mouth as needed.   Yes [provider]  rosuvastatin (CRESTOR) 10 MG tablet TAKE 1 TABLET BY MOUTH DAILY 11/21/21  Yes Horald Pollen, MD    Allergies  Allergen Reactions   Amoxicillin Rash   Penicillins Swelling    Patient Active Problem List   Diagnosis Date Noted   Stage 3a chronic kidney disease ([place]) 11/20/2021   Dyslipidemia 08/20/2021    Prediabetes 02/14/2021   Chronic venous insufficiency 02/14/2021   Hypothyroidism 02/14/2021   Class 2 severe obesity due to excess calories with serious comorbidity and body mass index (BMI) of 39.0 to 39.9 in adult [facility]) 02/14/2021   Type 2 diabetes mellitus without complication, without long-term current use of insulin (Janet Leblanc) 02/28/2020   Other hyperlipidemia 02/28/2020   Vitamin D deficiency 02/28/2020   Essential hypertension 03/24/2016   Vertigo of central origin, unspecified laterality 06/08/2014   Tinnitus of both ears 06/08/2014    Past Medical History:  Diagnosis Date   Allergy    seasonal   Arthritis    Hypertension    Thyroid disease    Vertigo     Past Surgical History:  Procedure Laterality Date   CHOLECYSTECTOMY     COLONOSCOPY  06/20/2019   FOOT SURGERY     GASTRIC BYPASS     15 years ago at least   TUBAL LIGATION      Social History   Socioeconomic History   Marital status: Married    Spouse name: Janet Leblanc   Number of children: 2   Years of education: 12   Highest education level: Not on file  Occupational History   Occupation: Animator   Tobacco Use   Smoking status: Former    Years: 15.00    Types: Cigarettes    Quit date: 1990    Years since quitting:  33.8   Smokeless tobacco: Never  Vaping Use   Vaping Use: Never used  Substance and Sexual Activity   Alcohol use: No    Alcohol/week: 0.0 standard drinks of alcohol   Drug use: No   Sexual activity: Not on file  Other Topics Concern   Not on file  Social History Narrative   Not on file   Social Determinants of Health   Financial Resource Strain: Low Risk  (08/20/2021)   Overall Financial Resource Strain (CARDIA)    Difficulty of Paying Living Expenses: Not hard at all  Food Insecurity: No Food Insecurity (08/20/2021)   Hunger Vital Sign    Worried About Running Out of Food in the Last Year: Never true    Ran Out of Food in the Last Year: Never true  Transportation Needs: No  Transportation Needs (08/20/2021)   PRAPARE - Hydrologist (Medical): No    Lack of Transportation (Non-Medical): No  Physical Activity: Inactive (08/20/2021)   Exercise Vital Sign    Days of Exercise per Week: 0 days    Minutes of Exercise per Session: 0 min  Stress: No Stress Concern Present (08/20/2021)   [place]    Feeling of Stress : Not at all  Social Connections: [place] (08/20/2021)   Social Connection and Isolation Panel [NHANES]    Frequency of Communication with Friends and Family: More than three times a week    Frequency of Social Gatherings with Friends and Family: More than three times a week    Attends Religious Services: More than 4 times per year    Active Member of [employer] or Organizations: Yes    Attends Music therapist: More than 4 times per year    Marital Status: Married  Human resources officer Violence: Not At Risk (08/20/2021)   Humiliation, Afraid, Rape, and Kick questionnaire    Fear of Current or Ex-Partner: No    Emotionally Abused: No    Physically Abused: No    Sexually Abused: No    Family History  Problem Relation Age of Onset   Heart disease Father    Hypertension Father    Atrial fibrillation Sister    Heart disease Brother    Stroke Brother    Throat cancer Brother    Stroke Brother    Heart disease Paternal Grandmother    Cancer Paternal Grandfather    Migraines Neg Hx    Colon polyps Neg Hx    Colon cancer Neg Hx    Esophageal cancer Neg Hx    Rectal cancer Neg Hx    Stomach cancer Neg Hx      Review of Systems  Constitutional:  Negative for chills and fever.  HENT:  Positive for congestion.   Respiratory:  Positive for cough.   Cardiovascular:  Negative for chest pain and palpitations.  Gastrointestinal:  Negative for abdominal pain, nausea and vomiting.  Skin:  Negative for rash.  Neurological: Negative.  Negative  for dizziness and headaches.   Today's Vitals   02/13/22 1305  BP: 124/76  Pulse: 72  Temp: 98.1 F (36.7 C)  TempSrc: Oral  SpO2: 97%  Weight: 251 lb 2 oz (113.9 kg)  Height: '5\' 7"'$  (1.702 m)   Body mass index is 39.33 kg/m.   Physical Exam Vitals reviewed.  Constitutional:      Appearance: Normal appearance.  HENT:     Head: Normocephalic.  Eyes:  Extraocular Movements: Extraocular movements intact.     Pupils: Pupils are equal, round, and reactive to light.  Cardiovascular:     Rate and Rhythm: Normal rate and regular rhythm.     Pulses: Normal pulses.     Heart sounds: Normal heart sounds.  Pulmonary:     Effort: Pulmonary effort is normal.     Breath sounds: Rales (Dry crackles left lower lobe) present.  Skin:    General: Skin is warm and dry.  Neurological:     Mental Status: She is alert and oriented to person, place, and time.  Psychiatric:        Mood and Affect: Mood normal.        Behavior: Behavior normal.    DG Chest 2 View  Result Date: 02/13/2022 CLINICAL DATA:  Persistent cough x1 month EXAM: CHEST - 2 VIEW COMPARISON:  None Available. FINDINGS: Cardiac size is within normal limits. There are no signs of pulmonary edema or focal pulmonary consolidation. Peribronchial thickening is seen. There is no pleural effusion or pneumothorax. Surgical clips are seen in upper abdomen. IMPRESSION: Peribronchial thickening suggests bronchitis. There is no focal pulmonary consolidation. There is no pleural effusion. Electronically Signed   By: Elmer Picker M.D.   On: 02/13/2022 13:43     ASSESSMENT & PLAN: A total of 42 minutes was spent with the patient and counseling/coordination of care regarding preparing for this visit, review of most recent office visit notes, review of multiple chronic medical problems and their management, review of all medications, diagnosis of acute bronchitis possible early pneumonia and need to start antibiotics, cough management,  prognosis, ED precautions, documentation, and need for follow-up.  Problem List Items Addressed This Visit       Cardiovascular and Mediastinum   Essential hypertension    Well-controlled hypertension. Continue losartan 50 mg daily.        Respiratory   Lower respiratory infection   Relevant Medications   azithromycin (ZITHROMAX) 250 MG tablet   Other Relevant Orders   DG Chest 2 View     Endocrine   Hypothyroidism    Clinically euthyroid. Continue Synthroid 50 mcg daily.        Genitourinary   Stage 3a chronic kidney disease ([place])    Advised to stay well-hydrated and avoid NSAIDs        Other   Prediabetes    Diet and nutrition discussed.      Dyslipidemia    Stable.  Continue rosuvastatin 10 mg daily.      Acute cough - Primary    Cough management discussed. Continue over-the-counter Mucinex DM and cough drops Start Tessalon 200 mg 3 times daily as needed and Hycodan syrup at bedtime. Advised to rest and stay well-hydrated.      Relevant Medications   benzonatate (TESSALON) 200 MG capsule   HYDROcodone bit-homatropine (HYCODAN) 5-1.5 MG/5ML syrup   Patient Instructions  Cough, Adult A cough helps to clear your throat and lungs. A cough may be a sign of an illness or another medical condition. An acute cough may only last 2-3 weeks, while a chronic cough may last 8 or more weeks. Many things can cause a cough. They include: Germs (viruses or bacteria) that attack the airway. Breathing in things that bother (irritate) your lungs. Allergies. Asthma. Mucus that runs down the back of your throat (postnasal drip). Smoking. Acid backing up from the stomach into the tube that moves food from the mouth to the stomach (gastroesophageal reflux). Some  medicines. Lung problems. Other medical conditions, such as heart failure or a blood clot in the lung (pulmonary embolism). Follow these instructions at home: Medicines Take over-the-counter and prescription  medicines only as told by your doctor. Talk with your doctor before you take medicines that stop a cough (cough suppressants). Lifestyle  Do not smoke, and try not to be around smoke. Do not use any products that contain nicotine or tobacco, such as cigarettes, e-cigarettes, and chewing tobacco. If you need help quitting, ask your doctor. Drink enough fluid to keep your pee (urine) pale yellow. Avoid caffeine. Do not drink alcohol if your doctor tells you not to drink. General instructions  Watch for any changes in your cough. Tell your doctor about them. Always cover your mouth when you cough. Stay away from things that make you cough, such as perfume, candles, campfire smoke, or cleaning products. If the air is dry, use a cool mist vaporizer or humidifier in your home. If your cough is worse at night, try using extra pillows to raise your head up higher while you sleep. Rest as needed. Keep all follow-up visits as told by your doctor. This is important. Contact a doctor if: You have new symptoms. You cough up pus. Your cough does not get better after 2-3 weeks, or your cough gets worse. Cough medicine does not help your cough and you are not sleeping well. You have pain that gets worse or pain that is not helped with medicine. You have a fever. You are losing weight and you do not know why. You have night sweats. Get help right away if: You cough up blood. You have trouble breathing. Your heartbeat is very fast. These symptoms may be an emergency. Do not wait to see if the symptoms will go away. Get medical help right away. Call your local emergency services (911 in the [place] Do not drive yourself to the hospital. Summary A cough helps to clear your throat and lungs. Many things can cause a cough. Take over-the-counter and prescription medicines only as told by your doctor. Always cover your mouth when you cough. Contact a doctor if you have new symptoms or you have a cough that  does not get better or gets worse. This information is not intended to replace advice given to you by your health care provider. Make sure you discuss any questions you have with your health care provider. Document Revised: 05/13/2019 Document Reviewed: 04/12/2018 Elsevier Patient Education  [place], MD [facility]

## 2022-02-13 NOTE — Assessment & Plan Note (Signed)
Stable.  Continue rosuvastatin 10 mg daily.

## 2022-02-13 NOTE — Assessment & Plan Note (Signed)
Clinically euthyroid. Continue Synthroid 50 mcg daily.

## 2022-02-13 NOTE — Assessment & Plan Note (Signed)
Cough management discussed. Continue over-the-counter Mucinex DM and cough drops Start Tessalon 200 mg 3 times daily as needed and Hycodan syrup at bedtime. Advised to rest and stay well-hydrated.

## 2022-02-13 NOTE — Assessment & Plan Note (Signed)
Well-controlled hypertension. Continue losartan 50 mg daily.

## 2022-02-13 NOTE — Assessment & Plan Note (Signed)
Diet and nutrition discussed. 

## 2022-02-13 NOTE — Assessment & Plan Note (Signed)
Suspected early pneumonia. Start daily azithromycin for 5 days. Chest x-ray done today.

## 2022-02-13 NOTE — Patient Instructions (Signed)

## 2022-02-24 ENCOUNTER — Encounter: Payer: Self-pay | Admitting: Emergency Medicine

## 2022-02-24 ENCOUNTER — Ambulatory Visit (INDEPENDENT_AMBULATORY_CARE_PROVIDER_SITE_OTHER): Payer: Medicare Other | Admitting: Emergency Medicine

## 2022-02-24 VITALS — BP 128/76 | HR 58 | Temp 97.8°F | Ht 67.0 in | Wt 253.0 lb

## 2022-02-24 DIAGNOSIS — I10 Essential (primary) hypertension: Secondary | ICD-10-CM

## 2022-02-24 DIAGNOSIS — Z6839 Body mass index (BMI) 39.0-39.9, adult: Secondary | ICD-10-CM

## 2022-02-24 DIAGNOSIS — E119 Type 2 diabetes mellitus without complications: Secondary | ICD-10-CM

## 2022-02-24 DIAGNOSIS — E039 Hypothyroidism, unspecified: Secondary | ICD-10-CM

## 2022-02-24 DIAGNOSIS — E785 Hyperlipidemia, unspecified: Secondary | ICD-10-CM | POA: Diagnosis not present

## 2022-02-24 DIAGNOSIS — N1831 Chronic kidney disease, stage 3a: Secondary | ICD-10-CM

## 2022-02-24 DIAGNOSIS — I872 Venous insufficiency (chronic) (peripheral): Secondary | ICD-10-CM | POA: Diagnosis not present

## 2022-02-24 LAB — POCT GLYCOSYLATED HEMOGLOBIN (HGB A1C): Hemoglobin A1C: 5.9 % — AB (ref 4.0–5.6)

## 2022-02-24 LAB — TSH: TSH: 2.6 u[IU]/mL (ref 0.35–5.50)

## 2022-02-24 MED ORDER — LOSARTAN POTASSIUM 50 MG PO TABS: 50.0000 mg | ORAL_TABLET | Freq: Every day | ORAL | 3 refills | Status: DC

## 2022-02-24 NOTE — Patient Instructions (Signed)
Health Maintenance After Age 70 After age 70, you are at a higher risk for certain long-term diseases and infections as well as injuries from falls. Falls are a major cause of broken bones and head injuries in people who are older than age 70. Getting regular preventive care can help to keep you healthy and well. Preventive care includes getting regular testing and making lifestyle changes as recommended by your health care provider. Talk with your health care provider about: Which screenings and tests you should have. A screening is a test that checks for a disease when you have no symptoms. A diet and exercise plan that is right for you. What should I know about screenings and tests to prevent falls? Screening and testing are the best ways to find a health problem early. Early diagnosis and treatment give you the best chance of managing medical conditions that are common after age 70. Certain conditions and lifestyle choices may make you more likely to have a fall. Your health care provider may recommend: Regular vision checks. Poor vision and conditions such as cataracts can make you more likely to have a fall. If you wear glasses, make sure to get your prescription updated if your vision changes. Medicine review. Work with your health care provider to regularly review all of the medicines you are taking, including over-the-counter medicines. Ask your health care provider about any side effects that may make you more likely to have a fall. Tell your health care provider if any medicines that you take make you feel dizzy or sleepy. Strength and balance checks. Your health care provider may recommend certain tests to check your strength and balance while standing, walking, or changing positions. Foot health exam. Foot pain and numbness, as well as not wearing proper footwear, can make you more likely to have a fall. Screenings, including: Osteoporosis screening. Osteoporosis is a condition that causes  the bones to get weaker and break more easily. Blood pressure screening. Blood pressure changes and medicines to control blood pressure can make you feel dizzy. Depression screening. You may be more likely to have a fall if you have a fear of falling, feel depressed, or feel unable to do activities that you used to do. Alcohol use screening. Using too much alcohol can affect your balance and may make you more likely to have a fall. Follow these instructions at home: Lifestyle Do not drink alcohol if: Your health care provider tells you not to drink. If you drink alcohol: Limit how much you have to: 0-1 drink a day for women. 0-2 drinks a day for men. Know how much alcohol is in your drink. In the [place] one drink equals one 12 oz bottle of beer (355 mL), one 5 oz glass of wine (148 mL), or one 1 oz glass of hard liquor (44 mL). Do not use any products that contain nicotine or tobacco. These products include cigarettes, chewing tobacco, and vaping devices, such as e-cigarettes. If you need help quitting, ask your health care provider. Activity  Follow a regular exercise program to stay fit. This will help you maintain your balance. Ask your health care provider what types of exercise are appropriate for you. If you need a cane or walker, use it as recommended by your health care provider. Wear supportive shoes that have nonskid soles. Safety  Remove any tripping hazards, such as rugs, cords, and clutter. Install safety equipment such as grab bars in bathrooms and safety rails on stairs. Keep rooms and walkways   well-lit. General instructions Talk with your health care provider about your risks for falling. Tell your health care provider if: You fall. Be sure to tell your health care provider about all falls, even ones that seem minor. You feel dizzy, tiredness (fatigue), or off-balance. Take over-the-counter and prescription medicines only as told by your health care provider. These include  supplements. Eat a healthy diet and maintain a healthy weight. A healthy diet includes low-fat dairy products, low-fat (lean) meats, and fiber from whole grains, beans, and lots of fruits and vegetables. Stay current with your vaccines. Schedule regular health, dental, and eye exams. Summary Having a healthy lifestyle and getting preventive care can help to protect your health and wellness after age 70. Screening and testing are the best way to find a health problem early and help you avoid having a fall. Early diagnosis and treatment give you the best chance for managing medical conditions that are more common for people who are older than age 70. Falls are a major cause of broken bones and head injuries in people who are older than age 70. Take precautions to prevent a fall at home. Work with your health care provider to learn what changes you can make to improve your health and wellness and to prevent falls. This information is not intended to replace advice given to you by your health care provider. Make sure you discuss any questions you have with your health care provider. Document Revised: 08/13/2020 Document Reviewed: 08/13/2020 Elsevier Patient Education  2023 Elsevier Inc.  

## 2022-02-24 NOTE — Progress Notes (Signed)
Janet Leblanc 71 y.o.   Chief Complaint  Patient presents with   Follow-up    54mth f/u appt, no concerns     HISTORY OF PRESENT ILLNESS: This is a 71y.o. female here for 355-monthollow-up of chronic medical problems. Overall doing well.  Has no complaints or medical concerns today.  HPI   Prior to Admission medications   Medication Sig Start Date End Date Taking? Authorizing Provider  aspirin 81 MG tablet Take 81 mg by mouth daily.   Yes [provider]  benzonatate (TESSALON) 200 MG capsule Take 1 capsule (200 mg total) by mouth 2 (two) times daily as needed for cough. 02/13/22  Yes Jassmin Kemmerer, MiInes BloomerMD  Cholecalciferol (VITAMIN D3) 50 MCG (2000 UT) capsule Take 1 capsule (2,000 Units total) by mouth daily. 02/28/20  Yes Just, KeLaurita QuintFNP  HYDROcodone bit-homatropine (HYCODAN) 5-1.5 MG/5ML syrup Take 5 mLs by mouth at bedtime as needed for cough. 02/13/22  Yes Tasnia Spegal, MiInes BloomerMD  levothyroxine (SYNTHROID) 50 MCG tablet TAKE 1 TABLET BY MOUTH EVERY  OTHER DAY 08/22/21  Yes Adian Jablonowski, MiInes BloomerMD  losartan (COZAAR) 50 MG tablet Take 1 tablet (50 mg total) by mouth daily. 11/20/21  Yes Layn Kye, MiInes BloomerMD  Magnesium 500 MG CAPS Take 500 mg by mouth daily.   Yes [provider]  Multiple Vitamin (MULTI VITAMIN DAILY PO) Take 1 tablet by mouth.   Yes [provider]  omeprazole (PRILOSEC) 20 MG capsule Take 1 capsule (20 mg total) by mouth every other day. 07/04/21  Yes GuWillia CrazeNP  POTASSIUM PO Take 1 tablet by mouth as needed.   Yes [provider]  rosuvastatin (CRESTOR) 10 MG tablet TAKE 1 TABLET BY MOUTH DAILY 11/21/21  Yes SaHorald PollenMD    Allergies  Allergen Reactions   Amoxicillin Rash   Penicillins Swelling    Patient Active Problem List   Diagnosis Date Noted   Lower respiratory infection 02/13/2022   Acute cough 02/13/2022   Stage 3a [facility] kidney disease ([facility]   Dyslipidemia  08/20/2021   Prediabetes 02/14/2021   Chronic venous insufficiency 02/14/2021   Hypothyroidism 02/14/2021   Class 2 severe obesity due to excess calories with serious comorbidity and body mass index (BMI) of 39.0 to 39.9 in adult ([facility]21   Type 2 diabetes mellitus without complication, without long-term current use of insulin ([facility]/23/2021   Other hyperlipidemia 02/28/2020   Vitamin D deficiency 02/28/2020   Essential hypertension 03/24/2016   Vertigo of central origin, unspecified laterality 06/08/2014   Tinnitus of both ears 06/08/2014    Past Medical History:  Diagnosis Date   Allergy    seasonal   Arthritis    Hypertension    Thyroid disease    Vertigo     Past Surgical History:  Procedure Laterality Date   CHOLECYSTECTOMY     COLONOSCOPY  06/20/2019   FOOT SURGERY     GASTRIC BYPASS     15 years ago at least   TUBAL LIGATION      Social History   Socioeconomic History   Marital status: Married    Spouse name: Therron   Number of children: 2   Years of education: 12   Highest education level: Not on file  Occupational History   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 Tobacco Use   Smoking status: Former    Years: 15.00    Types: Cigarettes    Quit date:  1990    Years since quitting: 33.9   Smokeless tobacco: Never  Vaping Use   Vaping Use: Never used  Substance and Sexual Activity   Alcohol use: No    Alcohol/week: 0.0 standard drinks of alcohol   Drug use: No   Sexual activity: Not on file  Other Topics Concern   Not on file  Social History Narrative   Not on file   Social Determinants of Health   Financial Resource Strain: Low Risk  (08/20/2021)   Overall Financial Resource Strain (CARDIA)    Difficulty of Paying Living Expenses: Not hard at all  Food Insecurity: No Food Insecurity (08/20/2021)   Hunger Vital Sign    Worried About Running Out of Food in the Last Year: Never true    [place] in the Last Year: Never true  Transportation  Needs: No Transportation Needs (08/20/2021)   PRAPARE - Hydrologist (Medical): No    Lack of Transportation (Non-Medical): No  Physical Activity: Inactive (08/20/2021)   Exercise Vital Sign    Days of Exercise per Week: 0 days    Minutes of Exercise per Session: 0 min  Stress: No Stress Concern Present (08/20/2021)   [place]    Feeling of Stress : Not at all  Social Connections: [place] (08/20/2021)   Social Connection and Isolation Panel [NHANES]    Frequency of Communication with Friends and Family: More than three times a week    Frequency of Social Gatherings with Friends and Family: More than three times a week    Attends Religious Services: More than 4 times per year    Active Member of [employer] or Organizations: Yes    Attends Music therapist: More than 4 times per year    Marital Status: Married  Human resources officer Violence: Not At Risk (08/20/2021)   Humiliation, Afraid, Rape, and Kick questionnaire    Fear of Current or Ex-Partner: No    Emotionally Abused: No    Physically Abused: No    Sexually Abused: No    Family History  Problem Relation Age of Onset   Heart disease Father    Hypertension Father    Atrial fibrillation Sister    Heart disease Brother    Stroke Brother    Throat cancer Brother    Stroke Brother    Heart disease Paternal Grandmother    Cancer Paternal Grandfather    Migraines Neg Hx    Colon polyps Neg Hx    Colon cancer Neg Hx    Esophageal cancer Neg Hx    Rectal cancer Neg Hx    Stomach cancer Neg Hx      Review of Systems  Constitutional: Negative.  Negative for chills and fever.  HENT: Negative.  Negative for congestion and sore throat.   Respiratory: Negative.  Negative for cough and shortness of breath.   Cardiovascular: Negative.  Negative for chest pain and palpitations.  Gastrointestinal: Negative.  Negative for  abdominal pain, nausea and vomiting.  Genitourinary: Negative.   Musculoskeletal: Negative.   Skin: Negative.  Negative for rash.  Neurological: Negative.  Negative for dizziness and headaches.  All other systems reviewed and are negative.   Today's Vitals   02/24/22 1356  BP: 128/76  Pulse: (!) 58  Temp: 97.8 F (36.6 C)  TempSrc: Oral  SpO2: 96%  Weight: 253 lb (114.8 kg)  Height: '5\' 7"'$  (1.702 m)  Body mass index is 39.63 kg/m. Wt Readings from Last 3 Encounters:  02/24/22 253 lb (114.8 kg)  02/13/22 251 lb 2 oz (113.9 kg)  11/20/21 243 lb 2 oz (110.3 kg)    Physical Exam Vitals reviewed.  Constitutional:      Appearance: Normal appearance.  HENT:     Head: Normocephalic.  Eyes:     Extraocular Movements: Extraocular movements intact.     Pupils: Pupils are equal, round, and reactive to light.  Cardiovascular:     Rate and Rhythm: Normal rate and regular rhythm.     Pulses: Normal pulses.     Heart sounds: Normal heart sounds.  Pulmonary:     Effort: Pulmonary effort is normal.     Breath sounds: Normal breath sounds.  Abdominal:     General: There is no distension.     Palpations: Abdomen is soft.     Tenderness: There is no abdominal tenderness.  Musculoskeletal:     Cervical back: No tenderness.  Lymphadenopathy:     Cervical: No cervical adenopathy.  Skin:    General: Skin is warm and dry.  Neurological:     General: No focal deficit present.     Mental Status: She is alert and oriented to person, place, and time.  Psychiatric:        Mood and Affect: Mood normal.        Behavior: Behavior normal.    Results for orders placed or performed in visit on 02/24/22 (from the past 24 hour(s))  POCT HgB A1C     Status: Abnormal   Collection Time: 02/24/22  3:34 PM  Result Value Ref Range   Hemoglobin A1C 5.9 (A) 4.0 - 5.6 %   HbA1c POC (<> result, manual entry)     HbA1c, POC (prediabetic range)     HbA1c, POC (controlled diabetic range)        ASSESSMENT & PLAN: A total of 45 minutes was spent with the patient and counseling/coordination of care regarding preparing for this visit, review of most recent office visit notes, review of most recent blood work results including interpretation of today's hemoglobin A1c, review of multiple chronic medical conditions and their management, review of all medications, education on nutrition, prognosis, documentation, need for follow-up.  Problem List Items Addressed This Visit       Cardiovascular and Mediastinum   Essential hypertension - Primary    Well-controlled hypertension. Normal blood pressure readings at home. Continue losartan 50 mg daily. Cardiovascular risks associated with hypertension discussed.      Relevant Orders   Comprehensive metabolic panel   Chronic venous insufficiency     Endocrine   Type 2 diabetes mellitus without complication, without long-term current use of insulin (HCC)    Stable off medications. Hemoglobin A1c of 5.9. Diet and nutrition discussed.      Relevant Orders   POCT HgB A1C   Hypothyroidism    Clinically euthyroid.  TSH done today. Continue Synthroid 50 mcg daily.      Relevant Orders   TSH     Genitourinary   Stage 3a chronic kidney disease ([place])    Advised to stay well-hydrated and avoid NSAIDs. CMP done today. Nephrology referral sent during last visit.  She was contacted.  Needs to call them back to schedule an appointment      Relevant Orders   Comprehensive metabolic panel     Other   Class 2 severe obesity due to excess calories with serious comorbidity  and body mass index (BMI) of 39.0 to 39.9 in adult [facility])    Diet and nutrition discussed. Advised to decrease amount of daily carbohydrate intake and daily calories and increase amount of plant based protein in her diet.      Dyslipidemia    Stable.  Diet and nutrition discussed.  Continue rosuvastatin 10 mg daily.       Relevant Orders   Lipid panel    Patient Instructions  Health Maintenance After Age 51 After age 46, you are at a higher risk for certain long-term diseases and infections as well as injuries from falls. Falls are a major cause of broken bones and head injuries in people who are older than age 50. Getting regular preventive care can help to keep you healthy and well. Preventive care includes getting regular testing and making lifestyle changes as recommended by your health care provider. Talk with your health care provider about: Which screenings and tests you should have. A screening is a test that checks for a disease when you have no symptoms. A diet and exercise plan that is right for you. What should I know about screenings and tests to prevent falls? Screening and testing are the best ways to find a health problem early. Early diagnosis and treatment give you the best chance of managing medical conditions that are common after age 68. Certain conditions and lifestyle choices may make you more likely to have a fall. Your health care provider may recommend: Regular vision checks. Poor vision and conditions such as cataracts can make you more likely to have a fall. If you wear glasses, make sure to get your prescription updated if your vision changes. Medicine review. Work with your health care provider to regularly review all of the medicines you are taking, including over-the-counter medicines. Ask your health care provider about any side effects that may make you more likely to have a fall. Tell your health care provider if any medicines that you take make you feel dizzy or sleepy. Strength and balance checks. Your health care provider may recommend certain tests to check your strength and balance while standing, walking, or changing positions. Foot health exam. Foot pain and numbness, as well as not wearing proper footwear, can make you more likely to have a fall. Screenings, including: Osteoporosis screening. Osteoporosis is  a condition that causes the bones to get weaker and break more easily. Blood pressure screening. Blood pressure changes and medicines to control blood pressure can make you feel dizzy. Depression screening. You may be more likely to have a fall if you have a fear of falling, feel depressed, or feel unable to do activities that you used to do. Alcohol use screening. Using too much alcohol can affect your balance and may make you more likely to have a fall. Follow these instructions at home: Lifestyle Do not drink alcohol if: Your health care provider tells you not to drink. If you drink alcohol: Limit how much you have to: 0-1 drink a day for women. 0-2 drinks a day for men. Know how much alcohol is in your drink. In the [place] one drink equals one 12 oz bottle of beer (355 mL), one 5 oz glass of wine (148 mL), or one 1 oz glass of hard liquor (44 mL). Do not use any products that contain nicotine or tobacco. These products include cigarettes, chewing tobacco, and vaping devices, such as e-cigarettes. If you need help quitting, ask your health care provider. Activity  Follow a  regular exercise program to stay fit. This will help you maintain your balance. Ask your health care provider what types of exercise are appropriate for you. If you need a cane or walker, use it as recommended by your health care provider. Wear supportive shoes that have nonskid soles. Safety  Remove any tripping hazards, such as rugs, cords, and clutter. Install safety equipment such as grab bars in bathrooms and safety rails on stairs. Keep rooms and walkways well-lit. General instructions Talk with your health care provider about your risks for falling. Tell your health care provider if: You fall. Be sure to tell your health care provider about all falls, even ones that seem minor. You feel dizzy, tiredness (fatigue), or off-balance. Take over-the-counter and prescription medicines only as told by your health care  provider. These include supplements. Eat a healthy diet and maintain a healthy weight. A healthy diet includes low-fat dairy products, low-fat (lean) meats, and fiber from whole grains, beans, and lots of fruits and vegetables. Stay current with your vaccines. Schedule regular health, dental, and eye exams. Summary Having a healthy lifestyle and getting preventive care can help to protect your health and wellness after age 16. Screening and testing are the best way to find a health problem early and help you avoid having a fall. Early diagnosis and treatment give you the best chance for managing medical conditions that are more common for people who are older than age 52. Falls are a major cause of broken bones and head injuries in people who are older than age 68. Take precautions to prevent a fall at home. Work with your health care provider to learn what changes you can make to improve your health and wellness and to prevent falls. This information is not intended to replace advice given to you by your health care provider. Make sure you discuss any questions you have with your health care provider. Document Revised: 08/13/2020 Document Reviewed: 08/13/2020 Elsevier Patient Education  [place], MD [facility]

## 2022-02-24 NOTE — Assessment & Plan Note (Signed)
Well-controlled hypertension. Normal blood pressure readings at home. Continue losartan 50 mg daily. Cardiovascular risks associated with hypertension discussed.

## 2022-02-24 NOTE — Assessment & Plan Note (Signed)
Clinically euthyroid.  TSH done today. Continue Synthroid 50 mcg daily. 

## 2022-02-24 NOTE — Assessment & Plan Note (Signed)
Stable.  Diet and nutrition discussed.  Continue rosuvastatin 10 mg daily.  

## 2022-02-24 NOTE — Addendum Note (Signed)
Addended by: Davina Poke on: 02/24/2022 04:06 PM   Modules accepted: Orders

## 2022-02-24 NOTE — Assessment & Plan Note (Signed)
Diet and nutrition discussed.  Advised to decrease amount of daily carbohydrate intake and daily calories and increase amount of plant-based protein in her diet. 

## 2022-02-24 NOTE — Assessment & Plan Note (Signed)
Stable off medications. Hemoglobin A1c of 5.9. Diet and nutrition discussed.

## 2022-02-24 NOTE — Assessment & Plan Note (Signed)
Advised to stay well-hydrated and avoid NSAIDs. CMP done today. Nephrology referral sent during last visit.  She was contacted.  Needs to call them back to schedule an appointment

## 2022-02-25 LAB — LIPID PANEL
Cholesterol: 110 mg/dL (ref 0–200)
HDL: 42.2 mg/dL (ref >39.00)
LDL Cholesterol: 51 mg/dL (ref 0–99)
NonHDL: 67.93
Total CHOL/HDL Ratio: 3
Triglycerides: 87 mg/dL (ref 0.0–149.0)
VLDL: 17.4 mg/dL (ref 0.0–40.0)

## 2022-02-25 LAB — COMPREHENSIVE METABOLIC PANEL
ALT: 17 U/L (ref 0–35)
AST: 21 U/L (ref 0–37)
Albumin: 3.7 g/dL (ref 3.5–5.2)
Alkaline Phosphatase: 79 U/L (ref 39–117)
BUN: 22 mg/dL (ref 6–23)
CO2: 27 mEq/L (ref 19–32)
Calcium: 9.2 mg/dL (ref 8.4–10.5)
Chloride: 105 mEq/L (ref 96–112)
Creatinine, Ser: 1.2 mg/dL (ref 0.40–1.20)
GFR: 45.86 mL/min — ABNORMAL LOW (ref >60.00)
Glucose, Bld: 111 mg/dL — ABNORMAL HIGH (ref 70–99)
Potassium: 4.4 mEq/L (ref 3.5–5.1)
Sodium: 139 mEq/L (ref 135–145)
Total Bilirubin: 0.4 mg/dL (ref 0.2–1.2)
Total Protein: 6.6 g/dL (ref 6.0–8.3)

## 2022-03-06 DIAGNOSIS — M79641 Pain in right hand: Secondary | ICD-10-CM | POA: Diagnosis not present

## 2022-03-06 DIAGNOSIS — G5601 Carpal tunnel syndrome, right upper limb: Secondary | ICD-10-CM | POA: Diagnosis not present

## 2022-03-06 DIAGNOSIS — M65321 Trigger finger, right index finger: Secondary | ICD-10-CM | POA: Diagnosis not present

## 2022-03-06 DIAGNOSIS — Z4789 Encounter for other orthopedic aftercare: Secondary | ICD-10-CM | POA: Diagnosis not present

## 2022-05-14 DIAGNOSIS — H1031 Unspecified acute conjunctivitis, right eye: Secondary | ICD-10-CM | POA: Diagnosis not present

## 2022-05-21 ENCOUNTER — Other Ambulatory Visit: Payer: Self-pay | Admitting: Emergency Medicine

## 2022-05-21 DIAGNOSIS — I10 Essential (primary) hypertension: Secondary | ICD-10-CM

## 2022-05-28 ENCOUNTER — Other Ambulatory Visit: Payer: Self-pay | Admitting: Emergency Medicine

## 2022-05-28 DIAGNOSIS — E039 Hypothyroidism, unspecified: Secondary | ICD-10-CM

## 2022-06-28 ENCOUNTER — Encounter (HOSPITAL_COMMUNITY): Payer: Self-pay

## 2022-06-28 ENCOUNTER — Ambulatory Visit (HOSPITAL_COMMUNITY)
Admission: EM | Admit: 2022-06-28 | Discharge: 2022-06-28 | Disposition: A | Discharge status: Home / Self Care | Payer: Medicare Other | Attending: Physician Assistant | Admitting: Physician Assistant

## 2022-06-28 DIAGNOSIS — J0100 Acute maxillary sinusitis, unspecified: Secondary | ICD-10-CM

## 2022-06-28 DIAGNOSIS — R062 Wheezing: Secondary | ICD-10-CM

## 2022-06-28 MED ORDER — DOXYCYCLINE HYCLATE 100 MG PO CAPS: 100.0000 mg | ORAL_CAPSULE | Freq: Two times a day (BID) | ORAL | 0 refills | Status: AC

## 2022-06-28 MED ORDER — ALBUTEROL SULFATE HFA 108 (90 BASE) MCG/ACT IN AERS: 2.0000 | INHALATION_SPRAY | Freq: Four times a day (QID) | RESPIRATORY_TRACT | 2 refills | Status: AC | PRN

## 2022-06-28 MED ORDER — PREDNISONE 20 MG PO TABS: 20.0000 mg | ORAL_TABLET | Freq: Two times a day (BID) | ORAL | 0 refills | Status: AC

## 2022-06-28 NOTE — ED Provider Notes (Signed)
[place]    CSN: [id number] Arrival date & time: 06/28/22  1444      History   Chief Complaint Chief Complaint  Patient presents with   Facial Pain   Headache    HPI Janet Leblanc is a 72 y.o. female presenting today for complaints of headache, sinus pressure x 5 days.  She has noted some cough and wheezing.  Negative for fever or chills.  No shortness of breath or chest pain.  She is taking Mucinex and NyQuil over-the-counter.  Denies history of asthma or COPD, but states that she has recurrence of the symptoms usually every year and a rescue inhaler helps, but she needs a refill.    Past Medical History:  Diagnosis Date   Allergy    seasonal   Arthritis    Hypertension    Thyroid disease    Vertigo     Patient Active Problem List   Diagnosis Date Noted   Acute cough 02/13/2022   Stage 3a chronic kidney disease ([place]) 11/20/2021   Dyslipidemia 08/20/2021   Prediabetes 02/14/2021   Chronic venous insufficiency 02/14/2021   Hypothyroidism 02/14/2021   Class 2 severe obesity due to excess calories with serious comorbidity and body mass index (BMI) of 39.0 to 39.9 in adult [facility]) 02/14/2021   Type 2 diabetes mellitus without complication, without long-term current use of insulin ([place]) 02/28/2020   Other hyperlipidemia 02/28/2020   Vitamin D deficiency 02/28/2020   Essential hypertension 03/24/2016   Vertigo of central origin, unspecified laterality 06/08/2014   Tinnitus of both ears 06/08/2014    Past Surgical History:  Procedure Laterality Date   CHOLECYSTECTOMY     COLONOSCOPY  06/20/2019   FOOT SURGERY     GASTRIC BYPASS     15 years ago at least   TUBAL LIGATION      OB History   No obstetric history on file.      Home Medications    Prior to Admission medications   Medication Sig Start Date End Date Taking? Authorizing Provider  albuterol (VENTOLIN HFA) 108 (90 Base) MCG/ACT inhaler Inhale 2 puffs into the lungs every 6 (six) hours as  needed for wheezing or shortness of breath. 06/28/22  Yes Holleigh Crihfield M, PA-C  aspirin 81 MG tablet Take 81 mg by mouth daily.   Yes [provider]  doxycycline (VIBRAMYCIN) 100 MG capsule Take 1 capsule (100 mg total) by mouth 2 (two) times daily for 7 days. 06/28/22 07/05/22 Yes Daeshon Grammatico M, PA-C  levothyroxine (SYNTHROID) 50 MCG tablet TAKE 1 TABLET BY MOUTH EVERY  OTHER DAY 05/29/22  Yes Sagardia, Ines Bloomer, MD  losartan (COZAAR) 50 MG tablet Take 1 tablet (50 mg total) by mouth daily. 02/24/22  Yes Sagardia, Ines Bloomer, MD  Magnesium 500 MG CAPS Take 500 mg by mouth daily.   Yes [provider]  omeprazole (PRILOSEC) 20 MG capsule Take 1 capsule (20 mg total) by mouth every other day. 07/04/21  Yes Willia Craze, NP  POTASSIUM PO Take 1 tablet by mouth as needed.   Yes [provider]  predniSONE (DELTASONE) 20 MG tablet Take 1 tablet (20 mg total) by mouth 2 (two) times daily with a meal for 5 days. 06/28/22 07/03/22 Yes Sandy Haye M, PA-C  rosuvastatin (CRESTOR) 10 MG tablet TAKE 1 TABLET BY MOUTH DAILY 11/21/21  Yes Sagardia, Ines Bloomer, MD  benzonatate (TESSALON) 200 MG capsule Take 1 capsule (200 mg total) by mouth 2 (two)  times daily as needed for cough. 02/13/22   Horald Pollen, MD  Cholecalciferol (VITAMIN D3) 50 MCG (2000 UT) capsule Take 1 capsule (2,000 Units total) by mouth daily. 02/28/20   Just, Laurita Quint, FNP  HYDROcodone bit-homatropine (HYCODAN) 5-1.5 MG/5ML syrup Take 5 mLs by mouth at bedtime as needed for cough. 02/13/22   Horald Pollen, MD  Multiple Vitamin (MULTI VITAMIN DAILY PO) Take 1 tablet by mouth.    [provider]    Family History Family History  Problem Relation Age of Onset   Heart disease Father    Hypertension Father    Atrial fibrillation Sister    Heart disease Brother    Stroke Brother    Throat cancer Brother    Stroke Brother    Heart disease Paternal Grandmother    Cancer  Paternal Grandfather    Migraines Neg Hx    Colon polyps Neg Hx    Colon cancer Neg Hx    Esophageal cancer Neg Hx    Rectal cancer Neg Hx    Stomach cancer Neg Hx     Social History Social History   Tobacco Use   Smoking status: Former    Years: 15    Types: Cigarettes    Quit date: 1990    Years since quitting: 34.2   Smokeless tobacco: Never  Vaping Use   Vaping Use: Never used  Substance Use Topics   Alcohol use: No    Alcohol/week: 0.0 standard drinks of alcohol   Drug use: No     Allergies   Amoxicillin and Penicillins   Review of Systems Review of Systems  Neurological:  Positive for headaches.     Physical Exam Triage Vital Signs ED Triage Vitals [06/28/22 1613]  Enc Vitals Group     BP (!) 157/79     Pulse Rate 75     Resp 18     Temp 97.9 F (36.6 C)     Temp Source Oral     SpO2 100 %     Weight      Height      Head Circumference      Peak Flow      Pain Score      Pain Loc      Pain Edu?      Excl. in [place]?    No data found.  Updated Vital Signs BP (!) 157/79 (BP Location: Left Arm)   Pulse 75   Temp 97.9 F (36.6 C) (Oral)   Resp 18   SpO2 100%     Physical Exam Vitals and nursing note reviewed.  Constitutional:      General: She is not in acute distress.    Appearance: Normal appearance. She is not ill-appearing.  HENT:     Head: Normocephalic.     Right Ear: Tympanic membrane, ear canal and external ear normal.     Left Ear: Tympanic membrane, ear canal and external ear normal.     Nose: Congestion present.     Right Sinus: Maxillary sinus tenderness present.     Left Sinus: Maxillary sinus tenderness present.     Mouth/Throat:     Mouth: Mucous membranes are moist.     Pharynx: No oropharyngeal exudate or posterior oropharyngeal erythema.  Eyes:     Extraocular Movements: Extraocular movements intact.     Conjunctiva/sclera: Conjunctivae normal.     Pupils: Pupils are equal, round, and reactive to light.   Cardiovascular:  Rate and Rhythm: Normal rate and regular rhythm.     Pulses: Normal pulses.     Heart sounds: Normal heart sounds. No murmur heard. Pulmonary:     Effort: Pulmonary effort is normal. No respiratory distress.     Breath sounds: Wheezing (diffuse) present.  Musculoskeletal:     Cervical back: Normal range of motion.  Skin:    General: Skin is warm.  Neurological:     Mental Status: She is alert and oriented to person, place, and time.  Psychiatric:        Mood and Affect: Mood normal.        Behavior: Behavior normal.      UC Treatments / Results  Labs (all labs ordered are listed, but only abnormal results are displayed) Labs Reviewed - No data to display  EKG   Radiology No results found.  Procedures Procedures (including critical care time)  Medications Ordered in UC Medications - No data to display  Initial Impression / Assessment and Plan / UC Course  I have reviewed the triage vital signs and the nursing notes.  Pertinent labs & imaging results that were available during my care of the patient were reviewed by me and considered in my medical decision making (see chart for details).     Symptoms consistent with acute maxillary sinusitis, some diffuse wheezing noted on exam.  No red flags or respiratory distress.  Plan to start her on doxycycline 100 mg twice daily x 7 days.  Prednisone and albuterol rescue inhaler for added relief.  She can continue to take Mucinex and daily allergy medicine such as Claritin 10 mg for added relief.  Follow-up with her PCP as needed.  ED return precautions advised. Final Clinical Impressions(s) / UC Diagnoses   Final diagnoses:  Acute maxillary sinusitis, recurrence not specified  Wheezing     Discharge Instructions      Very good to meet you today.  Please take the medication as directed.  Monitor your symptoms, follow-up with primary care.  Return if worse or any change in symptoms.     ED  Prescriptions     Medication Sig Dispense Auth. Provider   doxycycline (VIBRAMYCIN) 100 MG capsule Take 1 capsule (100 mg total) by mouth 2 (two) times daily for 7 days. 14 capsule Reisa Coppola M, PA-C   predniSONE (DELTASONE) 20 MG tablet Take 1 tablet (20 mg total) by mouth 2 (two) times daily with a meal for 5 days. 10 tablet Quintessa Simmerman M, PA-C   albuterol (VENTOLIN HFA) 108 (90 Base) MCG/ACT inhaler Inhale 2 puffs into the lungs every 6 (six) hours as needed for wheezing or shortness of breath. 1 each Kathya Wilz, Randa Evens, PA-C      PDMP not reviewed this encounter.   AllwardtRanda Evens, PA-C 06/28/22 1905

## 2022-06-28 NOTE — Discharge Instructions (Signed)
Very good to meet you today.  Please take the medication as directed.  Monitor your symptoms, follow-up with primary care.  Return if worse or any change in symptoms.

## 2022-06-28 NOTE — ED Triage Notes (Signed)
Pt reports headache and sinus pressure x 5 days. Pt is taking mucinex and nyquil.

## 2022-07-02 ENCOUNTER — Encounter: Payer: Self-pay | Admitting: Emergency Medicine

## 2022-07-02 ENCOUNTER — Ambulatory Visit (INDEPENDENT_AMBULATORY_CARE_PROVIDER_SITE_OTHER): Payer: Medicare Other

## 2022-07-02 ENCOUNTER — Ambulatory Visit (INDEPENDENT_AMBULATORY_CARE_PROVIDER_SITE_OTHER): Payer: Medicare Other | Admitting: Emergency Medicine

## 2022-07-02 VITALS — BP 132/72 | HR 70 | Temp 97.9°F | Ht 67.0 in | Wt 253.5 lb

## 2022-07-02 DIAGNOSIS — M25562 Pain in left knee: Secondary | ICD-10-CM | POA: Diagnosis not present

## 2022-07-02 DIAGNOSIS — G8929 Other chronic pain: Secondary | ICD-10-CM | POA: Insufficient documentation

## 2022-07-02 MED ORDER — DICLOFENAC SODIUM 75 MG PO TBEC: 75.0000 mg | DELAYED_RELEASE_TABLET | Freq: Two times a day (BID) | ORAL | 0 refills | Status: AC

## 2022-07-02 NOTE — Patient Instructions (Signed)
Acute Knee Pain, Adult Many things can cause knee pain. Sometimes, knee pain is sudden (acute) and may be caused by damage, swelling, or irritation of the muscles and tissues that support your knee. The pain often goes away on its own with time and rest. If the pain does not go away, tests may be done to find out what is causing the pain. Follow these instructions at home: If you have a knee sleeve or brace:  Wear the knee sleeve or brace as told by your doctor. Take it off only as told by your doctor. Loosen it if your toes: Tingle. Become numb. Turn cold and blue. Keep it clean. If the knee sleeve or brace is not waterproof: Do not let it get wet. Cover it with a watertight covering when you take a bath or shower. Activity Rest your knee. Do not do things that cause pain or make pain worse. Avoid activities where both feet leave the ground at the same time (high-impact activities). Examples are running, jumping rope, and doing jumping jacks. Work with a physical therapist to make a safe exercise program, as told by your doctor. Managing pain, stiffness, and swelling  If told, put ice on the knee. To do this: If you have a removable knee sleeve or brace, take it off as told by your doctor. Put ice in a plastic bag. Place a towel between your skin and the bag. Leave the ice on for 20 minutes, 2-3 times a day. Take off the ice if your skin turns bright red. This is very important. If you cannot feel pain, heat, or cold, you have a greater risk of damage to the area. If told, use an elastic bandage to put pressure (compression) on your injured knee. Raise your knee above the level of your heart while you are sitting or lying down. Sleep with a pillow under your knee. General instructions Take over-the-counter and prescription medicines only as told by your doctor. Do not smoke or use any products that contain nicotine or tobacco. If you need help quitting, ask your doctor. If you are  overweight, work with your doctor and a food expert (dietitian) to set goals to lose weight. Being overweight can make your knee hurt more. Watch for any changes in your symptoms. Keep all follow-up visits. Contact a doctor if: The knee pain does not stop. The knee pain changes or gets worse. You have a fever along with knee pain. Your knee is red or feels warm when you touch it. Your knee gives out or locks up. Get help right away if: Your knee swells, and the swelling gets worse. You cannot move your knee. You have very bad knee pain that does not get better with pain medicine. Summary Many things can cause knee pain. The pain often goes away on its own with time and rest. Your doctor may do tests to find out the cause of the pain. Watch for any changes in your symptoms. Relieve your pain with rest, medicines, light activity, and use of ice. Get help right away if you cannot move your knee or your knee pain is very bad. This information is not intended to replace advice given to you by your health care provider. Make sure you discuss any questions you have with your health care provider. Document Revised: 09/07/2019 Document Reviewed: 09/07/2019 Elsevier Patient Education  2023 Elsevier Inc.  

## 2022-07-02 NOTE — Progress Notes (Signed)
Janet Leblanc 72 y.o.   Chief Complaint  Patient presents with   Knee Pain    Left knee pain x 2 weeks    HISTORY OF PRESENT ILLNESS: Acute problem visit today. This is a 72 y.o. female complaining of pain to left knee that started about 2 weeks ago Denies injury No other associated symptoms  HPI   Prior to Admission medications   Medication Sig Start Date End Date Taking? Authorizing Provider  albuterol (VENTOLIN HFA) 108 (90 Base) MCG/ACT inhaler Inhale 2 puffs into the lungs every 6 (six) hours as needed for wheezing or shortness of breath. 06/28/22  Yes Allwardt, Alyssa M, PA-C  aspirin 81 MG tablet Take 81 mg by mouth daily.   Yes [provider]  benzonatate (TESSALON) 200 MG capsule Take 1 capsule (200 mg total) by mouth 2 (two) times daily as needed for cough. 02/13/22  Yes Meelah Tallo, Ines Bloomer, MD  Cholecalciferol (VITAMIN D3) 50 MCG (2000 UT) capsule Take 1 capsule (2,000 Units total) by mouth daily. 02/28/20  Yes Just, Laurita Quint, FNP  doxycycline (VIBRAMYCIN) 100 MG capsule Take 1 capsule (100 mg total) by mouth 2 (two) times daily for 7 days. 06/28/22 07/05/22 Yes Allwardt, Alyssa M, PA-C  HYDROcodone bit-homatropine (HYCODAN) 5-1.5 MG/5ML syrup Take 5 mLs by mouth at bedtime as needed for cough. 02/13/22  Yes Crayton Savarese, Ines Bloomer, MD  levothyroxine (SYNTHROID) 50 MCG tablet TAKE 1 TABLET BY MOUTH EVERY  OTHER DAY 05/29/22  Yes Jakevion Arney, Ines Bloomer, MD  losartan (COZAAR) 50 MG tablet Take 1 tablet (50 mg total) by mouth daily. 02/24/22  Yes Elly Haffey, Ines Bloomer, MD  Magnesium 500 MG CAPS Take 500 mg by mouth daily.   Yes [provider]  Multiple Vitamin (MULTI VITAMIN DAILY PO) Take 1 tablet by mouth.   Yes [provider]  omeprazole (PRILOSEC) 20 MG capsule Take 1 capsule (20 mg total) by mouth every other day. 07/04/21  Yes Willia Craze, NP  POTASSIUM PO Take 1 tablet by mouth as needed.   Yes [provider]  predniSONE (DELTASONE)  20 MG tablet Take 1 tablet (20 mg total) by mouth 2 (two) times daily with a meal for 5 days. 06/28/22 07/03/22 Yes Allwardt, Alyssa M, PA-C  rosuvastatin (CRESTOR) 10 MG tablet TAKE 1 TABLET BY MOUTH DAILY 11/21/21  Yes Horald Pollen, MD    Allergies  Allergen Reactions   Amoxicillin Rash   Penicillins Swelling    Patient Active Problem List   Diagnosis Date Noted   Acute cough 02/13/2022   Stage 3a chronic kidney disease ([place]) 11/20/2021   Dyslipidemia 08/20/2021   Prediabetes 02/14/2021   Chronic venous insufficiency 02/14/2021   Hypothyroidism 02/14/2021   Class 2 severe obesity due to excess calories with serious comorbidity and body mass index (BMI) of 39.0 to 39.9 in adult [facility]) 02/14/2021   Type 2 diabetes mellitus without complication, without long-term current use of insulin ([place]) 02/28/2020   Other hyperlipidemia 02/28/2020   Vitamin D deficiency 02/28/2020   Essential hypertension 03/24/2016   Vertigo of central origin, unspecified laterality 06/08/2014   Tinnitus of both ears 06/08/2014    Past Medical History:  Diagnosis Date   Allergy    seasonal   Arthritis    Hypertension    Thyroid disease    Vertigo     Past Surgical History:  Procedure Laterality Date   CHOLECYSTECTOMY     COLONOSCOPY  06/20/2019   FOOT SURGERY  GASTRIC BYPASS     15 years ago at least   TUBAL LIGATION      Social History   Socioeconomic History   Marital status: Married    Spouse name: Therron   Number of children: 2   Years of education: 12   Highest education level: Not on file  Occupational History   Occupation: Animator   Tobacco Use   Smoking status: Former    Years: 15    Types: Cigarettes    Quit date: 1990    Years since quitting: 34.2   Smokeless tobacco: Never  Vaping Use   Vaping Use: Never used  Substance and Sexual Activity   Alcohol use: No    Alcohol/week: 0.0 standard drinks of alcohol   Drug use: No   Sexual activity: Not on file   Other Topics Concern   Not on file  Social History Narrative   Not on file   Social Determinants of Health   Financial Resource Strain: Low Risk  (08/20/2021)   Overall Financial Resource Strain (CARDIA)    Difficulty of Paying Living Expenses: Not hard at all  Food Insecurity: No Food Insecurity (08/20/2021)   Hunger Vital Sign    Worried About Running Out of Food in the Last Year: Never true    [place] in the Last Year: Never true  Transportation Needs: No Transportation Needs (08/20/2021)   PRAPARE - Hydrologist (Medical): No    Lack of Transportation (Non-Medical): No  Physical Activity: Inactive (08/20/2021)   Exercise Vital Sign    Days of Exercise per Week: 0 days    Minutes of Exercise per Session: 0 min  Stress: No Stress Concern Present (08/20/2021)   [place]    Feeling of Stress : Not at all  Social Connections: [place] (08/20/2021)   Social Connection and Isolation Panel [NHANES]    Frequency of Communication with Friends and Family: More than three times a week    Frequency of Social Gatherings with Friends and Family: More than three times a week    Attends Religious Services: More than 4 times per year    Active Member of [employer] or Organizations: Yes    Attends Music therapist: More than 4 times per year    Marital Status: Married  Human resources officer Violence: Not At Risk (08/20/2021)   Humiliation, Afraid, Rape, and Kick questionnaire    Fear of Current or Ex-Partner: No    Emotionally Abused: No    Physically Abused: No    Sexually Abused: No    Family History  Problem Relation Age of Onset   Heart disease Father    Hypertension Father    Atrial fibrillation Sister    Heart disease Brother    Stroke Brother    Throat cancer Brother    Stroke Brother    Heart disease Paternal Grandmother    Cancer Paternal Grandfather     Migraines Neg Hx    Colon polyps Neg Hx    Colon cancer Neg Hx    Esophageal cancer Neg Hx    Rectal cancer Neg Hx    Stomach cancer Neg Hx      Review of Systems  Constitutional: Negative.  Negative for chills and fever.  HENT: Negative.  Negative for congestion and sore throat.   Respiratory: Negative.  Negative for cough and shortness of breath.   Cardiovascular: Negative.  Negative for chest pain and palpitations.  Gastrointestinal:  Negative for abdominal pain, diarrhea, nausea and vomiting.  Genitourinary: Negative.  Negative for dysuria and hematuria.  Musculoskeletal:  Positive for joint pain (Left knee).  Skin: Negative.  Negative for rash.  Neurological: Negative.  Negative for dizziness and headaches.  All other systems reviewed and are negative.   Vitals:   07/02/22 1500  BP: 132/72  Pulse: 70  Temp: 97.9 F (36.6 C)  SpO2: 98%    Physical Exam Vitals reviewed.  Constitutional:      Appearance: Normal appearance. She is obese.  HENT:     Head: Normocephalic.  Eyes:     Extraocular Movements: Extraocular movements intact.  Cardiovascular:     Rate and Rhythm: Normal rate.  Pulmonary:     Effort: Pulmonary effort is normal.  Musculoskeletal:     Comments: Left knee: Mild medial swelling with tenderness.  Full range of motion without crepitation Stable in flexion and extension  Skin:    General: Skin is warm and dry.  Neurological:     General: No focal deficit present.     Mental Status: She is alert and oriented to person, place, and time.  Psychiatric:        Mood and Affect: Mood normal.        Behavior: Behavior normal.    DG Knee Complete 4 Views Left  Result Date: 07/02/2022 CLINICAL DATA:  Left knee pain EXAM: LEFT KNEE - COMPLETE 4+ VIEW COMPARISON:  None Available. FINDINGS: There is no evidence of acute fracture. Alignment is normal. 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 Small joint effusion. 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 Electronically Signed   By: Maurine Simmering M.D.   On: 07/02/2022 15:37     ASSESSMENT & PLAN: Problem List Items Addressed This Visit       Other   Acute pain of left knee - Primary    Acute onset and affecting quality of life Differential diagnosis discussed X-ray shows osteoarthritis with mild effusion Pain management discussed May take diclofenac 75 mg twice a day as needed Needs orthopedic evaluation Referral placed today.       Relevant Medications   diclofenac (VOLTAREN) 75 MG EC tablet   Other Relevant Orders   Ambulatory referral to Sports Medicine   DG Knee Complete 4 Views Left (Completed)   Patient Instructions  Acute Knee Pain, Adult Many things can cause knee pain. Sometimes, knee pain is sudden (acute) and may be caused by damage, swelling, or irritation of the muscles and tissues that support your knee. The pain often goes away on its own with time and rest. If the pain does not go away, tests may be done to find out what is causing the pain. Follow these instructions at home: If you have a knee sleeve or brace:  Wear the knee sleeve or brace as told by your doctor. Take it off only as told by your doctor. Loosen it if your toes: Tingle. Become numb. Turn cold and blue. Keep it clean. If the knee sleeve or brace is not waterproof: Do not let it get wet. Cover it with a watertight covering when you take a bath or shower. Activity Rest your knee. Do not do things that cause pain or make pain worse. Avoid activities where both feet leave the ground at the same time (high-impact activities). Examples are running, jumping rope, and doing jumping jacks. Work with a physical therapist to  make a safe exercise program, as told by your doctor. Managing pain, stiffness, and swelling  If told, put ice on the knee. To do this: If you have a removable knee sleeve or brace, take it off as told by your  doctor. Put ice in a plastic bag. Place a towel between your skin and the bag. Leave the ice on for 20 minutes, 2-3 times a day. Take off the ice if your skin turns bright red. This is very important. If you cannot feel pain, heat, or cold, you have a greater risk of damage to the area. If told, use an elastic bandage to put pressure (compression) on your injured knee. Raise your knee above the level of your heart while you are sitting or lying down. Sleep with a pillow under your knee. General instructions Take over-the-counter and prescription medicines only as told by your doctor. Do not smoke or use any products that contain nicotine or tobacco. If you need help quitting, ask your doctor. If you are overweight, work with your doctor and a food expert (dietitian) to set goals to lose weight. Being overweight can make your knee hurt more. Watch for any changes in your symptoms. Keep all follow-up visits. Contact a doctor if: The knee pain does not stop. The knee pain changes or gets worse. You have a fever along with knee pain. Your knee is red or feels warm when you touch it. Your knee gives out or locks up. Get help right away if: Your knee swells, and the swelling gets worse. You cannot move your knee. You have very bad knee pain that does not get better with pain medicine. Summary Many things can cause knee pain. The pain often goes away on its own with time and rest. Your doctor may do tests to find out the cause of the pain. Watch for any changes in your symptoms. Relieve your pain with rest, medicines, light activity, and use of ice. Get help right away if you cannot move your knee or your knee pain is very bad. This information is not intended to replace advice given to you by your health care provider. Make sure you discuss any questions you have with your health care provider. Document Revised: 09/07/2019 Document Reviewed: 09/07/2019 Elsevier Patient Education  [place], MD [facility]

## 2022-07-02 NOTE — Assessment & Plan Note (Signed)
Acute onset and affecting quality of life Differential diagnosis discussed X-ray shows osteoarthritis with mild effusion Pain management discussed May take diclofenac 75 mg twice a day as needed Needs orthopedic evaluation Referral placed today.

## 2022-07-07 NOTE — Progress Notes (Unsigned)
Janet Goltz, PhD, LAT, ATC acting as a scribe for Lynne Leader, MD.  Subjective:    CC: L knee pain  HPI: Pt is a 72 y/o female c/o L knee pain ongoing for about 3-4 wks. No MOI. Pt locates pain to posterior aspect of the knee, causing difficulty with ambulation and WB. Over the past 3 weeks, sx have been more towards medial aspect of the knee. Notes cramping in the lower leg last night. Denies injury to the knee.   L Knee swelling: yes Mechanical symptoms: Yes Aggravates: WB, ambulation Treatments tried: oral diclofenac, heat, Nervive  Dx imaging: 07/02/22 L knee XR  Pertinent review of Systems: No fevers or chills  Relevant historical information: Hypertension   Objective:    Vitals:   07/08/22 1307  BP: (!) 150/78  Pulse: (!) 102  SpO2: 96%   General: Well Developed, well nourished, and in no acute distress.   MSK: Left knee: Normal-appearing Normal knee motion.   Tender palpation medial joint line.   Slight laxity medial stress test. Positive McMurray test medially. Intact strength..  Lab and Radiology Results  Procedure: Real-time Ultrasound Guided Injection of left knee superior lateral patellar space Device: Philips Affiniti 50G Images permanently stored and available for review in PACS Ultrasound evaluation prior to injection shows medial degeneration.  Small Baker's cyst is visible. Verbal informed consent obtained.  Discussed risks and benefits of procedure. Warned about infection, bleeding, hyperglycemia damage to structures among others. Patient expresses understanding and agreement Time-out conducted.   Noted no overlying erythema, induration, or other signs of local infection.   Skin prepped in a sterile fashion.   Local anesthesia: Topical Ethyl chloride.   With sterile technique and under real time ultrasound guidance: 40 mg of Kenalog and 2 mL Marcaine injected into knee joint. Fluid seen entering the joint capsule.   Completed without  difficulty   Pain immediately resolved suggesting accurate placement of the medication.   Advised to call if fevers/chills, erythema, induration, drainage, or persistent bleeding.   Images permanently stored and available for review in the ultrasound unit.  Impression: Technically successful ultrasound guided injection.   EXAM: LEFT KNEE - COMPLETE 4+ VIEW   COMPARISON:  None Available.   FINDINGS: There is no evidence of acute fracture. Alignment is normal. 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 Small joint effusion.   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     Electronically Signed   By: Maurine Simmering M.D.   On: 07/02/2022 15:37   I, Lynne Leader, personally (independently) visualized and performed the interpretation of the images attached in this note.      Impression and Recommendations:    Assessment and Plan: 72 y.o. female with left knee pain thought to be due to exacerbation of DJD or degenerative meniscus tear.  Plan for steroid injection today.  Recommend also Voltaren gel.  If not improved quickly after today's injection may consider a relatively rapid return to clinic to consider aspiration of Baker's cyst. Otherwise recheck in 1 month.  PDMP not reviewed this encounter. Orders Placed This Encounter  Procedures   [place] LIMITED JOINT SPACE STRUCTURES LOW LEFT(NO LINKED CHARGES)    Order Specific Question:   Reason for Exam (SYMPTOM  OR DIAGNOSIS REQUIRED)    Answer:   left knee pain    Order Specific Question:   Preferred imaging location?    Answer:   [facility]   No orders of the  defined types were placed in this encounter.   Discussed warning signs or symptoms. Please see discharge instructions. Patient expresses understanding.   The above documentation has been reviewed and is accurate and complete Lynne Leader, M.D.

## 2022-07-08 ENCOUNTER — Encounter: Payer: Self-pay | Admitting: Family Medicine

## 2022-07-08 ENCOUNTER — Ambulatory Visit: Payer: Medicare Other | Admitting: Family Medicine

## 2022-07-08 ENCOUNTER — Other Ambulatory Visit: Payer: Self-pay

## 2022-07-08 VITALS — BP 150/78 | HR 102 | Ht 67.0 in | Wt 250.8 lb

## 2022-07-08 DIAGNOSIS — M25562 Pain in left knee: Secondary | ICD-10-CM

## 2022-07-08 NOTE — Patient Instructions (Signed)
Thank you for coming in today.   You received an injection today. Seek immediate medical attention if the joint becomes red, extremely painful, or is oozing fluid.   Please use Voltaren gel (Generic Diclofenac Gel) up to 4x daily for pain as needed.  This is available over-the-counter as both the name brand Voltaren gel and the generic diclofenac gel.   If not better you could return in 1 week to aspirate the Baker's cyst in the back of your knee.  Otherwise, check back in 1 month

## 2022-07-14 ENCOUNTER — Ambulatory Visit (HOSPITAL_COMMUNITY)
Admission: EM | Admit: 2022-07-14 | Discharge: 2022-07-14 | Disposition: A | Discharge status: Home / Self Care | Payer: Medicare Other | Attending: Family Medicine | Admitting: Family Medicine

## 2022-07-14 ENCOUNTER — Encounter (HOSPITAL_COMMUNITY): Payer: Self-pay

## 2022-07-14 DIAGNOSIS — R42 Dizziness and giddiness: Secondary | ICD-10-CM

## 2022-07-14 LAB — CBC
HCT: 42 % (ref 36.0–46.0)
Hemoglobin: 13.6 g/dL (ref 12.0–15.0)
MCH: 30 pg (ref 26.0–34.0)
MCHC: 32.4 g/dL (ref 30.0–36.0)
MCV: 92.7 fL (ref 80.0–100.0)
Platelets: 377 10*3/uL (ref 150–400)
RBC: 4.53 MIL/uL (ref 3.87–5.11)
RDW: 13.7 % (ref 11.5–15.5)
WBC: 18.8 10*3/uL — ABNORMAL HIGH (ref 4.0–10.5)
nRBC: 0 % (ref 0.0–0.2)

## 2022-07-14 LAB — BASIC METABOLIC PANEL
Anion gap: 13 (ref 5–15)
BUN: 28 mg/dL — ABNORMAL HIGH (ref 8–23)
CO2: 25 mmol/L (ref 22–32)
Calcium: 9.7 mg/dL (ref 8.9–10.3)
Chloride: 100 mmol/L (ref 98–111)
Creatinine, Ser: 1.8 mg/dL — ABNORMAL HIGH (ref 0.44–1.00)
GFR, Estimated: 30 mL/min — ABNORMAL LOW (ref >60)
Glucose, Bld: 118 mg/dL — ABNORMAL HIGH (ref 70–99)
Potassium: 4.7 mmol/L (ref 3.5–5.1)
Sodium: 138 mmol/L (ref 135–145)

## 2022-07-14 NOTE — ED Provider Notes (Signed)
[facility]    CSN: [id number] Arrival date & time: 07/14/22  1748      History   Chief Complaint Chief Complaint  Patient presents with   Dizziness    HPI Janet Leblanc is a 72 y.o. female.    Dizziness  Here lightheadedness/dizziness, going on all day. She has eaten normally all day, but felt heavy in her head. No vertigo. No f/c/n/v/d.   She is not on meds for DM any longer.  She takes losartan 50 mg daily. She did take it this AM  When she got home from work her blood pressure was 99 systolic.  She rechecked it and it was a little lower a few minutes later and she decided to come here for evaluation  Her husband is here also being seen for upper respiratory symptoms.  She states she had those symptoms last week and they have resolved  Past Medical History:  Diagnosis Date   Allergy    seasonal   Arthritis    Hypertension    Thyroid disease    Vertigo     Patient Active Problem List   Diagnosis Date Noted   Acute pain of left knee 07/02/2022   Stage 3a chronic kidney disease 11/20/2021   Dyslipidemia 08/20/2021   Prediabetes 02/14/2021   Chronic venous insufficiency 02/14/2021   Hypothyroidism 02/14/2021   Class 2 severe obesity due to excess calories with serious comorbidity and body mass index (BMI) of 39.0 to 39.9 in adult 02/14/2021   Type 2 diabetes mellitus without complication, without long-term current use of insulin 02/28/2020   Other hyperlipidemia 02/28/2020   Vitamin D deficiency 02/28/2020   Essential hypertension 03/24/2016   Vertigo of central origin, unspecified laterality 06/08/2014   Tinnitus of both ears 06/08/2014    Past Surgical History:  Procedure Laterality Date   CHOLECYSTECTOMY     COLONOSCOPY  06/20/2019   FOOT SURGERY     GASTRIC BYPASS     15 years ago at least   TUBAL LIGATION      OB History   No obstetric history on file.      Home Medications    Prior to Admission medications   Medication Sig  Start Date End Date Taking? Authorizing Provider  albuterol (VENTOLIN HFA) 108 (90 Base) MCG/ACT inhaler Inhale 2 puffs into the lungs every 6 (six) hours as needed for wheezing or shortness of breath. 06/28/22  Yes Allwardt, Alyssa M, PA-C  aspirin 81 MG tablet Take 81 mg by mouth daily.   Yes [provider]  Cholecalciferol (VITAMIN D3) 50 MCG (2000 UT) capsule Take 1 capsule (2,000 Units total) by mouth daily. 02/28/20  Yes Just, Azalee CourseKelsea J, FNP  diclofenac (VOLTAREN) 75 MG EC tablet Take 1 tablet (75 mg total) by mouth 2 (two) times daily. 07/02/22  Yes Sagardia, Eilleen KempfMiguel Jose, MD  levothyroxine (SYNTHROID) 50 MCG tablet TAKE 1 TABLET BY MOUTH EVERY  OTHER DAY 05/29/22  Yes Sagardia, Eilleen KempfMiguel Jose, MD  losartan (COZAAR) 50 MG tablet Take 1 tablet (50 mg total) by mouth daily. 02/24/22  Yes Sagardia, Eilleen KempfMiguel Jose, MD  Magnesium 500 MG CAPS Take 500 mg by mouth daily.   Yes [provider]  Multiple Vitamin (MULTI VITAMIN DAILY PO) Take 1 tablet by mouth.   Yes [provider]  omeprazole (PRILOSEC) 20 MG capsule Take 1 capsule (20 mg total) by mouth every other day. 07/04/21  Yes Meredith PelGuenther, Paula M, NP  POTASSIUM PO Take 1 tablet  by mouth as needed.   Yes [provider]  rosuvastatin (CRESTOR) 10 MG tablet TAKE 1 TABLET BY MOUTH DAILY 11/21/21  Yes Sagardia, Eilleen Kempf, MD    Family History Family History  Problem Relation Age of Onset   Heart disease Father    Hypertension Father    Atrial fibrillation Sister    Heart disease Brother    Stroke Brother    Throat cancer Brother    Stroke Brother    Heart disease Paternal Grandmother    Cancer Paternal Grandfather    Migraines Neg Hx    Colon polyps Neg Hx    Colon cancer Neg Hx    Esophageal cancer Neg Hx    Rectal cancer Neg Hx    Stomach cancer Neg Hx     Social History Social History   Tobacco Use   Smoking status: Former    Years: 15    Types: Cigarettes    Quit date: 1990    Years since  quitting: 34.2   Smokeless tobacco: Never  Vaping Use   Vaping Use: Never used  Substance Use Topics   Alcohol use: No    Alcohol/week: 0.0 standard drinks of alcohol   Drug use: No     Allergies   Amoxicillin and Penicillins   Review of Systems Review of Systems  Neurological:  Positive for dizziness.     Physical Exam Triage Vital Signs ED Triage Vitals [07/14/22 1811]  Enc Vitals Group     BP 114/73     Pulse Rate 78     Resp 16     Temp 98 F (36.7 C)     Temp Source Oral     SpO2 98 %     Weight      Height      Head Circumference      Peak Flow      Pain Score      Pain Loc      Pain Edu?      Excl. in GC?    No data found.  Updated Vital Signs BP 114/73 (BP Location: Right Arm)   Pulse 78   Temp 98 F (36.7 C) (Oral)   Resp 16   SpO2 98%   Visual Acuity Right Eye Distance:   Left Eye Distance:   Bilateral Distance:    Right Eye Near:   Left Eye Near:    Bilateral Near:     Physical Exam Vitals reviewed.  Constitutional:      General: She is not in acute distress.    Appearance: She is not toxic-appearing.  HENT:     Nose: Nose normal.     Mouth/Throat:     Mouth: Mucous membranes are moist.     Pharynx: No oropharyngeal exudate or posterior oropharyngeal erythema.  Eyes:     Extraocular Movements: Extraocular movements intact.     Conjunctiva/sclera: Conjunctivae normal.     Pupils: Pupils are equal, round, and reactive to light.  Cardiovascular:     Rate and Rhythm: Normal rate and regular rhythm.     Heart sounds: No murmur heard. Pulmonary:     Effort: Pulmonary effort is normal. No respiratory distress.     Breath sounds: No wheezing, rhonchi or rales.  Chest:     Chest wall: No tenderness.  Abdominal:     Palpations: Abdomen is soft.     Tenderness: There is no abdominal tenderness.  Musculoskeletal:     Cervical back: Neck  supple.  Lymphadenopathy:     Cervical: No cervical adenopathy.  Skin:    Capillary Refill:  Capillary refill takes less than 2 seconds.     Coloration: Skin is not jaundiced or pale.  Neurological:     General: No focal deficit present.     Mental Status: She is alert and oriented to person, place, and time.  Psychiatric:        Behavior: Behavior normal.      UC Treatments / Results  Labs (all labs ordered are listed, but only abnormal results are displayed) Labs Reviewed  CBC  BASIC METABOLIC PANEL    EKG   Radiology No results found.  Procedures Procedures (including critical care time)  Medications Ordered in UC Medications - No data to display  Initial Impression / Assessment and Plan / UC Course  I have reviewed the triage vital signs and the nursing notes.  Pertinent labs & imaging results that were available during my care of the patient were reviewed by me and considered in my medical decision making (see chart for details).       Since her blood pressure measurement is improved here, and she is not tachycardic, and we will do lab evaluation here. CBC and bmp drawn today.  Staff will notify if there is anything significantly abnormal on her labs.  I have asked her to eat some salty snacks today and make sure she is drinking enough fluids.  If she worsens in any way she is to proceed to the emergency room. Final Clinical Impressions(s) / UC Diagnoses   Final diagnoses:  Dizziness     Discharge Instructions      We have drawn blood to check your blood counts and your sodium, potassium, sugar, and kidney function. Staff will call you if there is anything significantly abnormal  If you are feeling worse overnight, please proceed to the emergency room for urgent evaluation  Do not take your losartan in the morning and then resume it the next day.  If you are feeling normal in the morning and her blood pressure is high, then do go ahead and start taking losartan again in the morning.       ED Prescriptions   None    PDMP not reviewed  this encounter.   Zenia Resides, MD 07/14/22 [phone]

## 2022-07-14 NOTE — Discharge Instructions (Signed)
We have drawn blood to check your blood counts and your sodium, potassium, sugar, and kidney function. Staff will call you if there is anything significantly abnormal  If you are feeling worse overnight, please proceed to the emergency room for urgent evaluation  Do not take your losartan in the morning and then resume it the next day.  If you are feeling normal in the morning and her blood pressure is high, then do go ahead and start taking losartan again in the morning.

## 2022-07-14 NOTE — ED Triage Notes (Signed)
Pt is here for low blood pressure and dizziness . Pt took tylenol arthritis at 2pm today

## 2022-07-15 ENCOUNTER — Telehealth: Payer: Self-pay

## 2022-07-15 ENCOUNTER — Ambulatory Visit: Payer: Medicare Other | Admitting: Family Medicine

## 2022-07-15 ENCOUNTER — Other Ambulatory Visit: Payer: Self-pay

## 2022-07-15 VITALS — BP 132/84 | HR 76 | Ht 67.0 in | Wt 247.0 lb

## 2022-07-15 DIAGNOSIS — D72829 Elevated white blood cell count, unspecified: Secondary | ICD-10-CM | POA: Diagnosis not present

## 2022-07-15 DIAGNOSIS — M7122 Synovial cyst of popliteal space [Baker], left knee: Secondary | ICD-10-CM

## 2022-07-15 DIAGNOSIS — M25562 Pain in left knee: Secondary | ICD-10-CM

## 2022-07-15 NOTE — Patient Instructions (Addendum)
Thank you for coming in today.   Recheck in 1 month.   Let me know if this is not working.

## 2022-07-15 NOTE — Telephone Encounter (Signed)
Pt called to double check that she would be OK to come in for her visit this afternoon, due to recent lab results from her blood work done at the [facility] yesterday. Pt had experienced hypotension and dizziness and was seen at the UC. Pt reports that her knee is still painful. Her dizziness has resolved and she is continuing to hydrate. She is not concerned about driving since she is no longer experiencing dizziness. We agreed that she was fine to proceed w/ this afternoon's visit.

## 2022-07-15 NOTE — Progress Notes (Unsigned)
Janet Payor, PhD, LAT, ATC acting as a scribe for Janet Graham, MD.  Janet Leblanc is a 72 y.o. female who presents to [facility] at [facility] today for continued left knee pain.  Patient was last seen by Dr. Denyse Amass on 07/08/2022 and was given a left knee steroid injection and advised to use Voltaren gel.  If not improved patient advised to return to clinic to consider aspiration of Baker's cyst.  Today, patient reports L knee might feel very slightly improved, but is still very painful. She has been taking Tylenol and using the diclofenac gel.   Dx imaging: 07/02/2022 L knee XR  Pertinent review of systems: No fevers or chills  Relevant historical information: Hypertension.   Exam:  BP 132/84   Pulse 76   Ht 5\' 7"  (1.702 m)   Wt 247 lb (112 kg)   SpO2 97%   BMI 38.69 kg/m  General: Well Developed, well nourished, and in no acute distress.   MSK: Left knee mild effusion. Normal motion.  Tender palpation posterior medial knee.    Lab and Radiology Results  Procedure: Real-time Ultrasound Guided aspiration injection left knee Baker's cyst Device: Philips Affiniti 50G Images permanently stored and available for review in PACS Verbal informed consent obtained.  Discussed risks and benefits of procedure. Warned about infection, bleeding, hyperglycemia damage to structures among others. Patient expresses understanding and agreement Time-out conducted.   Noted no overlying erythema, induration, or other signs of local infection.   Skin prepped in a sterile fashion.   Local anesthesia: Topical Ethyl chloride.   With sterile technique and under real time ultrasound guidance: 2 mL of lidocaine injected subcutaneously achieving good anesthesia Skin again sterilized with isopropyl alcohol. 18-gauge needle used to access the Baker's cyst. 20 mL of clear straw-colored fluid aspirated. Syringe exchanged and 40 mg of Kenalog and 2 L of Marcaine injected into the now  decompressed Baker's cyst.  Completed without difficulty   Pain moderately  resolved suggesting accurate placement of the medication.   Advised to call if fevers/chills, erythema, induration, drainage, or persistent bleeding.   Images permanently stored and available for review in the ultrasound unit.  Impression: Technically successful ultrasound guided injection.     Results for orders placed or performed during the hospital encounter of 07/14/22 (from the past 72 hour(s))  CBC     Status: Abnormal   Collection Time: 07/14/22  6:41 PM  Result Value Ref Range   WBC 18.8 (H) 4.0 - 10.5 K/uL   RBC 4.53 3.87 - 5.11 MIL/uL   Hemoglobin 13.6 12.0 - 15.0 g/dL   HCT 49.7 02.6 - 37.8 %   MCV 92.7 80.0 - 100.0 fL   MCH 30.0 26.0 - 34.0 pg   MCHC 32.4 30.0 - 36.0 g/dL   RDW 58.8 50.2 - 77.4 %   Platelets 377 150 - 400 K/uL   nRBC 0.0 0.0 - 0.2 %    Comment: Performed at [facility], [address], [place], [place] [address]  Basic metabolic panel     Status: Abnormal   Collection Time: 07/14/22  6:41 PM  Result Value Ref Range   Sodium 138 135 - 145 mmol/L   Potassium 4.7 3.5 - 5.1 mmol/L   Chloride 100 98 - 111 mmol/L   CO2 25 22 - 32 mmol/L   Glucose, Bld 118 (H) 70 - 99 mg/dL    Comment: Glucose reference range applies only to samples taken  after fasting for at least 8 hours.   BUN 28 (H) 8 - 23 mg/dL   Creatinine, Ser 2.20 (H) 0.44 - 1.00 mg/dL   Calcium 9.7 8.9 - 25.4 mg/dL   GFR, Estimated 30 (L) >60 mL/min    Comment: (NOTE) Calculated using the CKD-EPI Creatinine Equation (2021)    Anion gap 13 5 - 15    Comment: Performed at [facility], [address], [place], [place] [address]   No results found.     Assessment and Plan:  Left knee pain and dysfunction.  Patient had an interarticular steroid injection a week ago with minimal benefit.  Plan for aspiration injection of Baker's cyst.  If not improved consider MRI to further evaluate source of knee  pain.  She did have leukocytosis seen on labs recently when she was ill.  This is likely secondary to the steroid injection she had less than a week ago.  She has a follow-up appointment scheduled with her PCP in about a month.  Rechecking white cell count then would be a good idea.  Recheck with me in 1 month.   PDMP not reviewed this encounter. Orders Placed This Encounter  Procedures   [place] LIMITED JOINT SPACE STRUCTURES LOW LEFT(NO LINKED CHARGES)    Order Specific Question:   Reason for Exam (SYMPTOM  OR DIAGNOSIS REQUIRED)    Answer:   Left knee pain    Order Specific Question:   Preferred imaging location?    Answer:   [facility]   No orders of the defined types were placed in this encounter.    Discussed warning signs or symptoms. Please see discharge instructions. Patient expresses understanding.   The above documentation has been reviewed and is accurate and complete Janet Leblanc, M.D.

## 2022-07-18 ENCOUNTER — Telehealth (HOSPITAL_COMMUNITY): Payer: Self-pay | Admitting: Emergency Medicine

## 2022-07-18 NOTE — Telephone Encounter (Signed)
Follow up call to patient, she is much improved.  No remaining questions or concerns at this time.

## 2022-08-12 ENCOUNTER — Encounter: Payer: Self-pay | Admitting: Family Medicine

## 2022-08-12 ENCOUNTER — Other Ambulatory Visit: Payer: Self-pay

## 2022-08-12 ENCOUNTER — Ambulatory Visit: Payer: Medicare Other | Admitting: Family Medicine

## 2022-08-12 VITALS — BP 110/78 | HR 75 | Ht 67.0 in | Wt 246.8 lb

## 2022-08-12 DIAGNOSIS — G8929 Other chronic pain: Secondary | ICD-10-CM | POA: Diagnosis not present

## 2022-08-12 DIAGNOSIS — M7122 Synovial cyst of popliteal space [Baker], left knee: Secondary | ICD-10-CM | POA: Diagnosis not present

## 2022-08-12 DIAGNOSIS — M25562 Pain in left knee: Secondary | ICD-10-CM

## 2022-08-12 NOTE — Patient Instructions (Addendum)
Thank you for coming in today.   Let me know how this goes.   Tylenol arthritis is safe to take.   We will see how it goes.   We can repeat the injection every 3 months which will be in July.   Let me know if we need to do the special knee injections. I need to know ahead of time so we can authorize them.

## 2022-08-12 NOTE — Progress Notes (Signed)
   I, Stevenson Clinch, CMA acting as a scribe for [name], MD.  Janet Leblanc is a 72 y.o. female who presents to [facility] at [facility] today for 1 month follow-up left knee pain.  Patient was last seen by Dr. Denyse Amass on 07/15/22 and her L knee Baker's cyst was aspirated and injection w/ a steroid. Today, pt reports significant improvement of knee sx following inj. Was stretching Sunday night, felt and heard a pop in the knee. C/O joint line pain since then. Denies swelling.  .  Overall her pain is improving compared to a month ago.  Overall she is pretty happy with how things are going.  Dx imaging: 07/02/2022 L knee XR   Pertinent review of systems: No fevers or chills  Relevant historical information: Obesity.  Diabetes.   Exam:  BP 110/78   Pulse 75   Ht 5\' 7"  (1.702 m)   Wt 246 lb 12.8 oz (111.9 kg)   SpO2 98%   BMI 38.65 kg/m  General: Well Developed, well nourished, and in no acute distress.   MSK: Left knee: Normal-appearing Normal motion. Nontender. Intact strength.    Lab and Radiology Results  Diagnostic Limited MSK Ultrasound of: Left knee Quad tendon intact normal. Patellar tendon normal. Medial joint line narrowed degenerative. Lateral joint line normal. Posterior knee large Baker's cyst is visible. Impression: Persist and DJD      Assessment and Plan: 72 y.o. female with left knee pain.  Overall improved after intra-articular injection and Baker's cyst aspiration and injection.  The Baker's cyst is still present but her pain is better controlled now.  She is happy with how things are going.  Plan for watchful waiting.  Can repeat injection at the 4-month mark which would be early July.  If needed we could authorize hyaluronic acid injections or even Zilretta injections.  Ultimately she may need a knee replacement.   PDMP not reviewed this encounter. Orders Placed This Encounter  Procedures   [place] LIMITED JOINT SPACE STRUCTURES LOW  LEFT(NO LINKED CHARGES)    Order Specific Question:   Reason for Exam (SYMPTOM  OR DIAGNOSIS REQUIRED)    Answer:   left knee pain    Order Specific Question:   Preferred imaging location?    Answer:   [facility]   No orders of the defined types were placed in this encounter.    Discussed warning signs or symptoms. Please see discharge instructions. Patient expresses understanding.   The above documentation has been reviewed and is accurate and complete [name], M.D.

## 2022-08-22 ENCOUNTER — Telehealth: Payer: Self-pay | Admitting: Emergency Medicine

## 2022-08-22 NOTE — Telephone Encounter (Signed)
Contacted Janet Leblanc to schedule their annual wellness visit. Appointment made for 09/09/2022.  [facility] Care Guide [facility] AWV TEAM Direct Dial: [phone]

## 2022-08-25 ENCOUNTER — Encounter: Payer: Self-pay | Admitting: Emergency Medicine

## 2022-08-25 ENCOUNTER — Ambulatory Visit (INDEPENDENT_AMBULATORY_CARE_PROVIDER_SITE_OTHER): Payer: Medicare Other | Admitting: Emergency Medicine

## 2022-08-25 VITALS — BP 108/70 | HR 64 | Temp 97.9°F | Ht 67.0 in | Wt 246.0 lb

## 2022-08-25 DIAGNOSIS — E785 Hyperlipidemia, unspecified: Secondary | ICD-10-CM

## 2022-08-25 DIAGNOSIS — R7303 Prediabetes: Secondary | ICD-10-CM | POA: Diagnosis not present

## 2022-08-25 DIAGNOSIS — I872 Venous insufficiency (chronic) (peripheral): Secondary | ICD-10-CM | POA: Diagnosis not present

## 2022-08-25 DIAGNOSIS — Z6839 Body mass index (BMI) 39.0-39.9, adult: Secondary | ICD-10-CM

## 2022-08-25 DIAGNOSIS — I10 Essential (primary) hypertension: Secondary | ICD-10-CM | POA: Diagnosis not present

## 2022-08-25 DIAGNOSIS — N1831 Chronic kidney disease, stage 3a: Secondary | ICD-10-CM | POA: Diagnosis not present

## 2022-08-25 DIAGNOSIS — E039 Hypothyroidism, unspecified: Secondary | ICD-10-CM

## 2022-08-25 LAB — COMPREHENSIVE METABOLIC PANEL
ALT: 25 U/L (ref 0–35)
AST: 22 U/L (ref 0–37)
Albumin: 3.7 g/dL (ref 3.5–5.2)
Alkaline Phosphatase: 75 U/L (ref 39–117)
BUN: 18 mg/dL (ref 6–23)
CO2: 30 mEq/L (ref 19–32)
Calcium: 9.2 mg/dL (ref 8.4–10.5)
Chloride: 104 mEq/L (ref 96–112)
Creatinine, Ser: 0.97 mg/dL (ref 0.40–1.20)
GFR: 58.99 mL/min — ABNORMAL LOW (ref >60.00)
Glucose, Bld: 110 mg/dL — ABNORMAL HIGH (ref 70–99)
Potassium: 4.7 mEq/L (ref 3.5–5.1)
Sodium: 140 mEq/L (ref 135–145)
Total Bilirubin: 0.4 mg/dL (ref 0.2–1.2)
Total Protein: 6.4 g/dL (ref 6.0–8.3)

## 2022-08-25 LAB — MICROALBUMIN / CREATININE URINE RATIO
Creatinine,U: 44.5 mg/dL
Microalb Creat Ratio: 1.6 mg/g (ref 0.0–30.0)
Microalb, Ur: <0.7 mg/dL (ref 0.0–1.9)

## 2022-08-25 LAB — CBC WITH DIFFERENTIAL/PLATELET
Basophils Absolute: 0 10*3/uL (ref 0.0–0.1)
Basophils Relative: 0.8 % (ref 0.0–3.0)
Eosinophils Absolute: 0.1 10*3/uL (ref 0.0–0.7)
Eosinophils Relative: 1.9 % (ref 0.0–5.0)
HCT: 38.8 % (ref 36.0–46.0)
Hemoglobin: 12.9 g/dL (ref 12.0–15.0)
Lymphocytes Relative: 33.5 % (ref 12.0–46.0)
Lymphs Abs: 2.1 10*3/uL (ref 0.7–4.0)
MCHC: 33.2 g/dL (ref 30.0–36.0)
MCV: 92.6 fl (ref 78.0–100.0)
Monocytes Absolute: 0.5 10*3/uL (ref 0.1–1.0)
Monocytes Relative: 8.7 % (ref 3.0–12.0)
Neutro Abs: 3.4 10*3/uL (ref 1.4–7.7)
Neutrophils Relative %: 55.1 % (ref 43.0–77.0)
Platelets: 289 10*3/uL (ref 150.0–400.0)
RBC: 4.2 Mil/uL (ref 3.87–5.11)
RDW: 15.4 % (ref 11.5–15.5)
WBC: 6.2 10*3/uL (ref 4.0–10.5)

## 2022-08-25 LAB — LIPID PANEL
Cholesterol: 123 mg/dL (ref 0–200)
HDL: 41.8 mg/dL (ref >39.00)
LDL Cholesterol: 62 mg/dL (ref 0–99)
NonHDL: 81.19
Total CHOL/HDL Ratio: 3
Triglycerides: 97 mg/dL (ref 0.0–149.0)
VLDL: 19.4 mg/dL (ref 0.0–40.0)

## 2022-08-25 LAB — HEMOGLOBIN A1C: Hgb A1c MFr Bld: 6.5 % (ref 4.6–6.5)

## 2022-08-25 NOTE — Assessment & Plan Note (Signed)
Clinically euthyroid.  TSH done today. Continue levothyroxine 50 mcg daily. 

## 2022-08-25 NOTE — Assessment & Plan Note (Signed)
Blood work done today. GFR was down to 30 last month Advised to stay well-hydrated and avoid NSAIDs

## 2022-08-25 NOTE — Patient Instructions (Signed)
Health Maintenance After Age 71 After age 71, you are at a higher risk for certain long-term diseases and infections as well as injuries from falls. Falls are a major cause of broken bones and head injuries in people who are older than age 71. Getting regular preventive care can help to keep you healthy and well. Preventive care includes getting regular testing and making lifestyle changes as recommended by your health care provider. Talk with your health care provider about: Which screenings and tests you should have. A screening is a test that checks for a disease when you have no symptoms. A diet and exercise plan that is right for you. What should I know about screenings and tests to prevent falls? Screening and testing are the best ways to find a health problem early. Early diagnosis and treatment give you the best chance of managing medical conditions that are common after age 71. Certain conditions and lifestyle choices may make you more likely to have a fall. Your health care provider may recommend: Regular vision checks. Poor vision and conditions such as cataracts can make you more likely to have a fall. If you wear glasses, make sure to get your prescription updated if your vision changes. Medicine review. Work with your health care provider to regularly review all of the medicines you are taking, including over-the-counter medicines. Ask your health care provider about any side effects that may make you more likely to have a fall. Tell your health care provider if any medicines that you take make you feel dizzy or sleepy. Strength and balance checks. Your health care provider may recommend certain tests to check your strength and balance while standing, walking, or changing positions. Foot health exam. Foot pain and numbness, as well as not wearing proper footwear, can make you more likely to have a fall. Screenings, including: Osteoporosis screening. Osteoporosis is a condition that causes  the bones to get weaker and break more easily. Blood pressure screening. Blood pressure changes and medicines to control blood pressure can make you feel dizzy. Depression screening. You may be more likely to have a fall if you have a fear of falling, feel depressed, or feel unable to do activities that you used to do. Alcohol use screening. Using too much alcohol can affect your balance and may make you more likely to have a fall. Follow these instructions at home: Lifestyle Do not drink alcohol if: Your health care provider tells you not to drink. If you drink alcohol: Limit how much you have to: 0-1 drink a day for women. 0-2 drinks a day for men. Know how much alcohol is in your drink. In the [place] one drink equals one 12 oz bottle of beer (355 mL), one 5 oz glass of wine (148 mL), or one 1 oz glass of hard liquor (44 mL). Do not use any products that contain nicotine or tobacco. These products include cigarettes, chewing tobacco, and vaping devices, such as e-cigarettes. If you need help quitting, ask your health care provider. Activity  Follow a regular exercise program to stay fit. This will help you maintain your balance. Ask your health care provider what types of exercise are appropriate for you. If you need a cane or walker, use it as recommended by your health care provider. Wear supportive shoes that have nonskid soles. Safety  Remove any tripping hazards, such as rugs, cords, and clutter. Install safety equipment such as grab bars in bathrooms and safety rails on stairs. Keep rooms and walkways   well-lit. General instructions Talk with your health care provider about your risks for falling. Tell your health care provider if: You fall. Be sure to tell your health care provider about all falls, even ones that seem minor. You feel dizzy, tiredness (fatigue), or off-balance. Take over-the-counter and prescription medicines only as told by your health care provider. These include  supplements. Eat a healthy diet and maintain a healthy weight. A healthy diet includes low-fat dairy products, low-fat (lean) meats, and fiber from whole grains, beans, and lots of fruits and vegetables. Stay current with your vaccines. Schedule regular health, dental, and eye exams. Summary Having a healthy lifestyle and getting preventive care can help to protect your health and wellness after age 71. Screening and testing are the best way to find a health problem early and help you avoid having a fall. Early diagnosis and treatment give you the best chance for managing medical conditions that are more common for people who are older than age 71. Falls are a major cause of broken bones and head injuries in people who are older than age 71. Take precautions to prevent a fall at home. Work with your health care provider to learn what changes you can make to improve your health and wellness and to prevent falls. This information is not intended to replace advice given to you by your health care provider. Make sure you discuss any questions you have with your health care provider. Document Revised: 08/13/2020 Document Reviewed: 08/13/2020 Elsevier Patient Education  2023 Elsevier Inc.  

## 2022-08-25 NOTE — Assessment & Plan Note (Signed)
Chronic stable condition Continue rosuvastatin 10 mg daily Lipid profile done today.  

## 2022-08-25 NOTE — Assessment & Plan Note (Signed)
Diet and nutrition discussed.  Advised to decrease amount of daily carbohydrate intake and daily calories and increase amount of plant-based protein in her diet. 

## 2022-08-25 NOTE — Progress Notes (Signed)
Janet Leblanc 72 y.o.   Chief Complaint  Patient presents with   Medical Management of Chronic Issues    f/u appt, no concerns     HISTORY OF PRESENT ILLNESS: This is a 72 y.o. female here for 15-month follow-up of chronic medical problems Chronic left knee pain with Baker's cyst.  Doing better after sports medicine evaluation and intervention. Ros no complaints or medical concerns today.  HPI   Prior to Admission medications   Medication Sig Start Date End Date Taking? Authorizing Provider  albuterol (VENTOLIN HFA) 108 (90 Base) MCG/ACT inhaler Inhale 2 puffs into the lungs every 6 (six) hours as needed for wheezing or shortness of breath. 06/28/22  Yes Allwardt, Alyssa M, PA-C  aspirin 81 MG tablet Take 81 mg by mouth daily.   Yes [provider]  Cholecalciferol (VITAMIN D3) 50 MCG (2000 UT) capsule Take 1 capsule (2,000 Units total) by mouth daily. 02/28/20  Yes Just, Azalee Course, FNP  diclofenac (VOLTAREN) 75 MG EC tablet Take 1 tablet (75 mg total) by mouth 2 (two) times daily. 07/02/22  Yes Jolynne Spurgin, Eilleen Kempf, MD  levothyroxine (SYNTHROID) 50 MCG tablet TAKE 1 TABLET BY MOUTH EVERY  OTHER DAY 05/29/22  Yes Zykerria Tanton, Eilleen Kempf, MD  losartan (COZAAR) 50 MG tablet Take 1 tablet (50 mg total) by mouth daily. 02/24/22  Yes Lyall Faciane, Eilleen Kempf, MD  Magnesium 500 MG CAPS Take 500 mg by mouth daily.   Yes [provider]  Multiple Vitamin (MULTI VITAMIN DAILY PO) Take 1 tablet by mouth.   Yes [provider]  omeprazole (PRILOSEC) 20 MG capsule Take 1 capsule (20 mg total) by mouth every other day. 07/04/21  Yes Meredith Pel, NP  POTASSIUM PO Take 1 tablet by mouth as needed.   Yes [provider]  rosuvastatin (CRESTOR) 10 MG tablet TAKE 1 TABLET BY MOUTH DAILY 11/21/21  Yes Georgina Quint, MD    Allergies  Allergen Reactions   Amoxicillin Rash   Penicillins Swelling    Patient Active Problem List   Diagnosis Date Noted    Chronic pain of left knee 07/02/2022   Stage 3a chronic kidney disease (HCC) 11/20/2021   Dyslipidemia 08/20/2021   Prediabetes 02/14/2021   Chronic venous insufficiency 02/14/2021   Hypothyroidism 02/14/2021   Class 2 severe obesity due to excess calories with serious comorbidity and body mass index (BMI) of 39.0 to 39.9 in adult [facility]) 02/14/2021   Type 2 diabetes mellitus without complication, without long-term current use of insulin (HCC) 02/28/2020   Other hyperlipidemia 02/28/2020   Vitamin D deficiency 02/28/2020   Essential hypertension 03/24/2016   Tinnitus of both ears 06/08/2014    Past Medical History:  Diagnosis Date   Allergy    seasonal   Arthritis    Hypertension    Thyroid disease    Vertigo     Past Surgical History:  Procedure Laterality Date   CHOLECYSTECTOMY     COLONOSCOPY  06/20/2019   FOOT SURGERY     GASTRIC BYPASS     15 years ago at least   TUBAL LIGATION      Social History   Socioeconomic History   Marital status: Married    Spouse name: Therron   Number of children: 2   Years of education: 12   Highest education level: Not on file  Occupational History   Occupation: Multimedia programmer   Tobacco Use   Smoking status: Former    Years: 15  Types: Cigarettes    Quit date: 46    Years since quitting: 34.4   Smokeless tobacco: Never  Vaping Use   Vaping Use: Never used  Substance and Sexual Activity   Alcohol use: No    Alcohol/week: 0.0 standard drinks of alcohol   Drug use: No   Sexual activity: Not on file  Other Topics Concern   Not on file  Social History Narrative   Not on file   Social Determinants of Health   Financial Resource Strain: Low Risk  (08/20/2021)   Overall Financial Resource Strain (CARDIA)    Difficulty of Paying Living Expenses: Not hard at all  Food Insecurity: No Food Insecurity (08/20/2021)   Hunger Vital Sign    Worried About Running Out of Food in the Last Year: Never true    Ran Out of Food in the  Last Year: Never true  Transportation Needs: No Transportation Needs (08/20/2021)   PRAPARE - Administrator, Civil Service (Medical): No    Lack of Transportation (Non-Medical): No  Physical Activity: Inactive (08/20/2021)   Exercise Vital Sign    Days of Exercise per Week: 0 days    Minutes of Exercise per Session: 0 min  Stress: No Stress Concern Present (08/20/2021)   [employer] of Occupational Health - Occupational Stress Questionnaire    Feeling of Stress : Not at all  Social Connections: Socially Integrated (08/20/2021)   Social Connection and Isolation Panel [NHANES]    Frequency of Communication with Friends and Family: More than three times a week    Frequency of Social Gatherings with Friends and Family: More than three times a week    Attends Religious Services: More than 4 times per year    Active Member of [employer] or Organizations: Yes    Attends Engineer, structural: More than 4 times per year    Marital Status: Married  Catering manager Violence: Not At Risk (08/20/2021)   Humiliation, Afraid, Rape, and Kick questionnaire    Fear of Current or Ex-Partner: No    Emotionally Abused: No    Physically Abused: No    Sexually Abused: No    Family History  Problem Relation Age of Onset   Heart disease Father    Hypertension Father    Atrial fibrillation Sister    Heart disease Brother    Stroke Brother    Throat cancer Brother    Stroke Brother    Heart disease Paternal Grandmother    Cancer Paternal Grandfather    Migraines Neg Hx    Colon polyps Neg Hx    Colon cancer Neg Hx    Esophageal cancer Neg Hx    Rectal cancer Neg Hx    Stomach cancer Neg Hx      Review of Systems  Constitutional: Negative.  Negative for chills and fever.  HENT: Negative.  Negative for congestion and sore throat.   Respiratory: Negative.  Negative for cough and shortness of breath.   Cardiovascular: Negative.  Negative for chest pain and palpitations.   Gastrointestinal:  Negative for abdominal pain, diarrhea, nausea and vomiting.  Genitourinary: Negative.  Negative for dysuria and hematuria.  Skin: Negative.  Negative for rash.  Neurological: Negative.  Negative for dizziness and headaches.  All other systems reviewed and are negative.   Today's Vitals   08/25/22 1305  BP: 108/70  Pulse: 64  Temp: 97.9 F (36.6 C)  TempSrc: Oral  SpO2: 98%  Weight: 246 lb (111.6  kg)  Height: 5\' 7"  (1.702 m)   Body mass index is 38.53 kg/m.   Physical Exam Vitals reviewed.  Constitutional:      Appearance: Normal appearance.  HENT:     Head: Normocephalic.     Mouth/Throat:     Mouth: Mucous membranes are moist.     Pharynx: Oropharynx is clear.  Eyes:     Extraocular Movements: Extraocular movements intact.     Pupils: Pupils are equal, round, and reactive to light.  Cardiovascular:     Rate and Rhythm: Normal rate and regular rhythm.     Pulses: Normal pulses.     Heart sounds: Normal heart sounds.  Pulmonary:     Effort: Pulmonary effort is normal.  Musculoskeletal:     Cervical back: No tenderness.  Lymphadenopathy:     Cervical: No cervical adenopathy.  Skin:    General: Skin is warm and dry.  Neurological:     Mental Status: She is alert and oriented to person, place, and time.  Psychiatric:        Mood and Affect: Mood normal.        Behavior: Behavior normal.      ASSESSMENT & PLAN: A total of 45 minutes was spent with the patient and counseling/coordination of care regarding preparing for this visit, review of most recent office visit notes, review of multiple chronic medical conditions and their management, review of all medications, review of most recent blood work results, education on nutrition, cardiovascular risks associated with hypertension, prognosis, documentation, need for follow-up.  Problem List Items Addressed This Visit       Cardiovascular and Mediastinum   Essential hypertension - Primary     Well-controlled hypertension Continue losartan 50 mg daily Cardiovascular risks associated with hypertension discussed Diet approaches to stop hypertension discussed      Relevant Orders   CBC with Differential/Platelet   Comprehensive metabolic panel   Chronic venous insufficiency    Stable without worsening edema or infection        Endocrine   Hypothyroidism    Clinically euthyroid TSH done today Continue levothyroxine 50 mcg daily      Relevant Orders   TSH     Genitourinary   Stage 3a chronic kidney disease (HCC)    Blood work done today. GFR was down to 30 last month Advised to stay well-hydrated and avoid NSAIDs      Relevant Orders   Urine Microalbumin w/creat. ratio     Other   Prediabetes    Stable.  Hemoglobin A1c done today Diet and nutrition discussed      Relevant Orders   Hemoglobin A1c   Urine Microalbumin w/creat. ratio   Class 2 severe obesity due to excess calories with serious comorbidity and body mass index (BMI) of 39.0 to 39.9 in adult [facility])    Diet and nutrition discussed Advised to decrease amount of daily carbohydrate intake and daily calories and increase amount of plant-based protein in her diet      Dyslipidemia    Chronic stable condition Continue rosuvastatin 10 mg daily Lipid profile done today      Relevant Orders   Comprehensive metabolic panel   Hemoglobin A1c   Lipid panel      Edwina Barth, MD [facility]

## 2022-08-25 NOTE — Assessment & Plan Note (Signed)
Stable without worsening edema or infection

## 2022-08-25 NOTE — Assessment & Plan Note (Signed)
Well-controlled hypertension Continue losartan 50 mg daily Cardiovascular risks associated with hypertension discussed Diet approaches to stop hypertension discussed

## 2022-08-25 NOTE — Assessment & Plan Note (Signed)
Stable.  Hemoglobin A1c done today. Diet and nutrition discussed. 

## 2022-08-26 LAB — TSH: TSH: 2.78 u[IU]/mL (ref 0.35–5.50)

## 2022-08-31 ENCOUNTER — Other Ambulatory Visit: Payer: Self-pay | Admitting: Nurse Practitioner

## 2022-08-31 ENCOUNTER — Other Ambulatory Visit: Payer: Self-pay | Admitting: Emergency Medicine

## 2022-08-31 DIAGNOSIS — E7849 Other hyperlipidemia: Secondary | ICD-10-CM

## 2022-09-02 DIAGNOSIS — H35033 Hypertensive retinopathy, bilateral: Secondary | ICD-10-CM | POA: Diagnosis not present

## 2022-09-09 ENCOUNTER — Ambulatory Visit (INDEPENDENT_AMBULATORY_CARE_PROVIDER_SITE_OTHER): Payer: Medicare Other

## 2022-09-09 VITALS — Ht 67.0 in | Wt 246.0 lb

## 2022-09-09 DIAGNOSIS — Z0000 Encounter for general adult medical examination without abnormal findings: Secondary | ICD-10-CM | POA: Diagnosis not present

## 2022-09-09 NOTE — Patient Instructions (Addendum)
Janet Leblanc , Thank you for taking time to come for your Medicare Wellness Visit. I appreciate your ongoing commitment to your health goals. Please review the following plan we discussed and let me know if I can assist you in the future.   These are the goals we discussed:  Goals       No current goals (pt-stated)      Patient declined health goal at this time.      Weight (lb) < 200 lb (90.7 kg)        This is a list of the screening recommended for you and due dates:  Health Maintenance  Topic Date Due   Complete foot exam   06/19/2021   Eye exam for diabetics  09/09/2022*   COVID-19 Vaccine (4 - 2023-24 season) 09/25/2022*   Zoster (Shingles) Vaccine (1 of 2) 10/02/2022*   Pneumonia Vaccine (1 of 1 - PCV) 07/02/2023*   DEXA scan (bone density measurement)  09/09/2023*   Flu Shot  11/06/2022   Hemoglobin A1C  02/25/2023   Yearly kidney function blood test for diabetes  08/25/2023   Yearly kidney health urinalysis for diabetes  08/25/2023   Medicare Annual Wellness Visit  09/09/2023   Mammogram  10/22/2023   Colon Cancer Screening  06/19/2029   Hepatitis C Screening  Completed   HPV Vaccine  Aged Out   DTaP/Tdap/Td vaccine  Discontinued  *Topic was postponed. The date shown is not the original due date.    Advanced directives: Please bring a copy of your health care power of attorney and living will to the office to be added to your chart at your convenience.   Conditions/risks identified: None  Next appointment: Follow up in one year for your annual wellness visit    Preventive Care 65 Years and Older, Female Preventive care refers to lifestyle choices and visits with your health care provider that can promote health and wellness. What does preventive care include? A yearly physical exam. This is also called an annual well check. Dental exams once or twice a year. Routine eye exams. Ask your health care provider how often you should have your eyes checked. Personal  lifestyle choices, including: Daily care of your teeth and gums. Regular physical activity. Eating a healthy diet. Avoiding tobacco and drug use. Limiting alcohol use. Practicing safe sex. Taking low-dose aspirin every day. Taking vitamin and mineral supplements as recommended by your health care provider. What happens during an annual well check? The services and screenings done by your health care provider during your annual well check will depend on your age, overall health, lifestyle risk factors, and family history of disease. Counseling  Your health care provider may ask you questions about your: Alcohol use. Tobacco use. Drug use. Emotional well-being. Home and relationship well-being. Sexual activity. Eating habits. History of falls. Memory and ability to understand (cognition). Work and work Astronomer. Reproductive health. Screening  You may have the following tests or measurements: Height, weight, and BMI. Blood pressure. Lipid and cholesterol levels. These may be checked every 5 years, or more frequently if you are over 61 years old. Skin check. Lung cancer screening. You may have this screening every year starting at age 29 if you have a 30-pack-year history of smoking and currently smoke or have quit within the past 15 years. Fecal occult blood test (FOBT) of the stool. You may have this test every year starting at age 39. Flexible sigmoidoscopy or colonoscopy. You may have a sigmoidoscopy every 5  years or a colonoscopy every 10 years starting at age 104. Hepatitis C blood test. Hepatitis B blood test. Sexually transmitted disease (STD) testing. Diabetes screening. This is done by checking your blood sugar (glucose) after you have not eaten for a while (fasting). You may have this done every 1-3 years. Bone density scan. This is done to screen for osteoporosis. You may have this done starting at age 53. Mammogram. This may be done every 1-2 years. Talk to your  health care provider about how often you should have regular mammograms. Talk with your health care provider about your test results, treatment options, and if necessary, the need for more tests. Vaccines  Your health care provider may recommend certain vaccines, such as: Influenza vaccine. This is recommended every year. Tetanus, diphtheria, and acellular pertussis (Tdap, Td) vaccine. You may need a Td booster every 10 years. Zoster vaccine. You may need this after age 84. Pneumococcal 13-valent conjugate (PCV13) vaccine. One dose is recommended after age 74. Pneumococcal polysaccharide (PPSV23) vaccine. One dose is recommended after age 43. Talk to your health care provider about which screenings and vaccines you need and how often you need them. This information is not intended to replace advice given to you by your health care provider. Make sure you discuss any questions you have with your health care provider. Document Released: 04/20/2015 Document Revised: 12/12/2015 Document Reviewed: 01/23/2015 Elsevier Interactive Patient Education  2017 [employer].  Fall Prevention in the Home Falls can cause injuries. They can happen to people of all ages. There are many things you can do to make your home safe and to help prevent falls. What can I do on the outside of my home? Regularly fix the edges of walkways and driveways and fix any cracks. Remove anything that might make you trip as you walk through a door, such as a raised step or threshold. Trim any bushes or trees on the path to your home. Use bright outdoor lighting. Clear any walking paths of anything that might make someone trip, such as rocks or tools. Regularly check to see if handrails are loose or broken. Make sure that both sides of any steps have handrails. Any raised decks and porches should have guardrails on the edges. Have any leaves, snow, or ice cleared regularly. Use sand or salt on walking paths during winter. Clean  up any spills in your garage right away. This includes oil or grease spills. What can I do in the bathroom? Use night lights. Install grab bars by the toilet and in the tub and shower. Do not use towel bars as grab bars. Use non-skid mats or decals in the tub or shower. If you need to sit down in the shower, use a plastic, non-slip stool. Keep the floor dry. Clean up any water that spills on the floor as soon as it happens. Remove soap buildup in the tub or shower regularly. Attach bath mats securely with double-sided non-slip rug tape. Do not have throw rugs and other things on the floor that can make you trip. What can I do in the bedroom? Use night lights. Make sure that you have a light by your bed that is easy to reach. Do not use any sheets or blankets that are too big for your bed. They should not hang down onto the floor. Have a firm chair that has side arms. You can use this for support while you get dressed. Do not have throw rugs and other things on the  floor that can make you trip. What can I do in the kitchen? Clean up any spills right away. Avoid walking on wet floors. Keep items that you use a lot in easy-to-reach places. If you need to reach something above you, use a strong step stool that has a grab bar. Keep electrical cords out of the way. Do not use floor polish or wax that makes floors slippery. If you must use wax, use non-skid floor wax. Do not have throw rugs and other things on the floor that can make you trip. What can I do with my stairs? Do not leave any items on the stairs. Make sure that there are handrails on both sides of the stairs and use them. Fix handrails that are broken or loose. Make sure that handrails are as long as the stairways. Check any carpeting to make sure that it is firmly attached to the stairs. Fix any carpet that is loose or worn. Avoid having throw rugs at the top or bottom of the stairs. If you do have throw rugs, attach them to the  floor with carpet tape. Make sure that you have a light switch at the top of the stairs and the bottom of the stairs. If you do not have them, ask someone to add them for you. What else can I do to help prevent falls? Wear shoes that: Do not have high heels. Have rubber bottoms. Are comfortable and fit you well. Are closed at the toe. Do not wear sandals. If you use a stepladder: Make sure that it is fully opened. Do not climb a closed stepladder. Make sure that both sides of the stepladder are locked into place. Ask someone to hold it for you, if possible. Clearly mark and make sure that you can see: Any grab bars or handrails. First and last steps. Where the edge of each step is. Use tools that help you move around (mobility aids) if they are needed. These include: Canes. Walkers. Scooters. Crutches. Turn on the lights when you go into a dark area. Replace any light bulbs as soon as they burn out. Set up your furniture so you have a clear path. Avoid moving your furniture around. If any of your floors are uneven, fix them. If there are any pets around you, be aware of where they are. Review your medicines with your doctor. Some medicines can make you feel dizzy. This can increase your chance of falling. Ask your doctor what other things that you can do to help prevent falls. This information is not intended to replace advice given to you by your health care provider. Make sure you discuss any questions you have with your health care provider. Document Released: 01/18/2009 Document Revised: 08/30/2015 Document Reviewed: 04/28/2014 Elsevier Interactive Patient Education  2017 [employer].

## 2022-09-09 NOTE — Progress Notes (Signed)
Subjective:   Janet Leblanc is a 72 y.o. female who presents for Medicare Annual (Subsequent) preventive examination.  Review of Systems    Virtual Visit via Telephone Note  I connected with  Janet Leblanc on 09/09/22 at  1:30 PM EDT by telephone and verified that I am speaking with the correct person using two identifiers.  Location: Patient: Home Provider: Office Persons participating in the virtual visit: patient/Nurse Health Advisor   I discussed the limitations, risks, security and privacy concerns of performing an evaluation and management service by telephone and the availability of in person appointments. The patient expressed understanding and agreed to proceed.  Interactive audio and video telecommunications were attempted between this nurse and patient, however failed, due to patient having technical difficulties OR patient did not have access to video capability.  We continued and completed visit with audio only.  Some vital signs may be absent or patient reported.   Tillie Rung, LPN  Cardiac Risk Factors include: advanced age (>61men, >63 women);hypertension     Objective:    Today's Vitals   09/09/22 1334  Weight: 246 lb (111.6 kg)  Height: 5\' 7"  (1.702 m)   Body mass index is 38.53 kg/m.     09/09/2022    1:41 PM 08/20/2021    4:23 PM 05/10/2019    2:10 PM  Advanced Directives  Does Patient Have a Medical Advance Directive? Yes Yes Yes  Type of Estate agent of [place];Living will Living will;Healthcare Power of [employer] Power of Attorney  Does patient want to make changes to medical advance directive?  No - Patient declined No - Patient declined  Copy of Healthcare Power of Attorney in Chart? No - copy requested No - copy requested No - copy requested    Current Medications (verified) Outpatient Encounter Medications as of 09/09/2022  Medication Sig   albuterol (VENTOLIN HFA) 108 (90 Base) MCG/ACT inhaler Inhale 2 puffs  into the lungs every 6 (six) hours as needed for wheezing or shortness of breath.   aspirin 81 MG tablet Take 81 mg by mouth daily.   Cholecalciferol (VITAMIN D3) 50 MCG (2000 UT) capsule Take 1 capsule (2,000 Units total) by mouth daily.   diclofenac (VOLTAREN) 75 MG EC tablet Take 1 tablet (75 mg total) by mouth 2 (two) times daily.   levothyroxine (SYNTHROID) 50 MCG tablet TAKE 1 TABLET BY MOUTH EVERY  OTHER DAY   losartan (COZAAR) 50 MG tablet Take 1 tablet (50 mg total) by mouth daily.   Magnesium 500 MG CAPS Take 500 mg by mouth daily.   Multiple Vitamin (MULTI VITAMIN DAILY PO) Take 1 tablet by mouth.   omeprazole (PRILOSEC) 20 MG capsule TAKE 1 CAPSULE BY MOUTH EVERY  OTHER DAY   POTASSIUM PO Take 1 tablet by mouth as needed.   rosuvastatin (CRESTOR) 10 MG tablet TAKE 1 TABLET BY MOUTH DAILY   No facility-administered encounter medications on file as of 09/09/2022.    Allergies (verified) Amoxicillin and Penicillins   History: Past Medical History:  Diagnosis Date   Allergy    seasonal   Arthritis    Hypertension    Thyroid disease    Vertigo    Past Surgical History:  Procedure Laterality Date   CHOLECYSTECTOMY     COLONOSCOPY  06/20/2019   FOOT SURGERY     GASTRIC BYPASS     15 years ago at least   TUBAL LIGATION     Family History  Problem Relation Age of Onset   Heart disease Father    Hypertension Father    Atrial fibrillation Sister    Heart disease Brother    Stroke Brother    Throat cancer Brother    Stroke Brother    Heart disease Paternal Grandmother    Cancer Paternal Grandfather    Migraines Neg Hx    Colon polyps Neg Hx    Colon cancer Neg Hx    Esophageal cancer Neg Hx    Rectal cancer Neg Hx    Stomach cancer Neg Hx    Social History   Socioeconomic History   Marital status: Married    Spouse name: Therron   Number of children: 2   Years of education: 12   Highest education level: Not on file  Occupational History   Occupation:  Multimedia programmer   Tobacco Use   Smoking status: Former    Years: 15    Types: Cigarettes    Quit date: 1990    Years since quitting: 34.4   Smokeless tobacco: Never  Vaping Use   Vaping Use: Never used  Substance and Sexual Activity   Alcohol use: No    Alcohol/week: 0.0 standard drinks of alcohol   Drug use: No   Sexual activity: Not on file  Other Topics Concern   Not on file  Social History Narrative   Not on file   Social Determinants of Health   Financial Resource Strain: Low Risk  (09/09/2022)   Overall Financial Resource Strain (CARDIA)    Difficulty of Paying Living Expenses: Not hard at all  Food Insecurity: No Food Insecurity (09/09/2022)   Hunger Vital Sign    Worried About Running Out of Food in the Last Year: Never true    Ran Out of Food in the Last Year: Never true  Transportation Needs: No Transportation Needs (09/09/2022)   PRAPARE - Administrator, Civil Service (Medical): No    Lack of Transportation (Non-Medical): No  Physical Activity: Inactive (09/09/2022)   Exercise Vital Sign    Days of Exercise per Week: 0 days    Minutes of Exercise per Session: 0 min  Stress: No Stress Concern Present (09/09/2022)   [employer] of Occupational Health - Occupational Stress Questionnaire    Feeling of Stress : Not at all  Social Connections: Socially Integrated (09/09/2022)   Social Connection and Isolation Panel [NHANES]    Frequency of Communication with Friends and Family: More than three times a week    Frequency of Social Gatherings with Friends and Family: More than three times a week    Attends Religious Services: More than 4 times per year    Active Member of [employer] or Organizations: Yes    Attends Engineer, structural: More than 4 times per year    Marital Status: Married    Tobacco Counseling Counseling given: Not Answered   Clinical Intake:  Pre-visit preparation completed: No  Pain : No/denies pain     BMI - recorded:  38.53 Nutritional Status: BMI > 30  Obese Nutritional Risks: None Diabetes: No  How often do you need to have someone help you when you read instructions, pamphlets, or other written materials from your doctor or pharmacy?: 1 - Never  Diabetic?  Pre Diabetic  Interpreter Needed?: No  Information entered by :: Theresa Mulligan LPN   Activities of Daily Living    09/09/2022    1:40 PM  In your present state of  health, do you have any difficulty performing the following activities:  Hearing? 0  Vision? 0  Difficulty concentrating or making decisions? 0  Walking or climbing stairs? 0  Dressing or bathing? 0  Doing errands, shopping? 0  Preparing Food and eating ? N  Using the Toilet? N  In the past six months, have you accidently leaked urine? N  Do you have problems with loss of bowel control? N  Managing your Medications? N  Managing your Finances? N  Housekeeping or managing your Housekeeping? N    Patient Care Team: Georgina Quint, MD as PCP - General (Internal Medicine) Flo Shanks, MD as Consulting Physician (Otolaryngology) Dominica Severin, MD as Consulting Physician (Orthopedic Surgery) [facility]  Indicate any recent Medical Services you may have received from other than [facility] providers in the past year (date may be approximate).     Assessment:   This is a routine wellness examination for Janet Leblanc.  Hearing/Vision screen Hearing Screening - Comments:: Denies hearing difficulties   Vision Screening - Comments:: Wears rx glasses - up to date with routine eye exams with  [facility]  Dietary issues and exercise activities discussed: Exercise limited by: None identified   Goals Addressed               This Visit's Progress     No current goals (pt-stated)         Depression Screen    09/09/2022    1:39 PM 08/25/2022    1:06 PM 07/02/2022    3:01 PM 02/24/2022    1:59 PM 02/13/2022    1:07 PM 11/20/2021    2:25 PM 08/20/2021     3:45 PM  PHQ 2/9 Scores  PHQ - 2 Score 0 0 0 0 0 0 0    Fall Risk    09/09/2022    1:40 PM 08/25/2022    1:06 PM 07/02/2022    3:01 PM 02/24/2022    1:59 PM 02/13/2022    1:07 PM  Fall Risk   Falls in the past year? 0 0 0 0 0  Number falls in past yr: 0 0 0 0 0  Injury with Fall? 0 0 0 0 0  Risk for fall due to : No Fall Risks No Fall Risks No Fall Risks No Fall Risks No Fall Risks  Follow up Falls prevention discussed Falls evaluation completed Falls evaluation completed Falls evaluation completed Falls evaluation completed    FALL RISK PREVENTION PERTAINING TO THE HOME:  Any stairs in or around the home? Yes  If so, are there any without handrails? No Home free of loose throw rugs in walkways, pet beds, electrical cords, etc? Yes  Adequate lighting in your home to reduce risk of falls? Yes   ASSISTIVE DEVICES UTILIZED TO PREVENT FALLS:  Life alert? No  Use of a cane, walker or w/c? No  Grab bars in the bathroom? No  Shower chair or bench in shower? Yes Elevated toilet seat or a handicapped toilet? Yes  TIMED UP AND GO:  Was the test performed? No . Audio Visit   Cognitive Function:        09/09/2022    1:41 PM 08/20/2021    4:25 PM 05/10/2019    2:10 PM  6CIT Screen  What Year? 0 points 0 points 0 points  What month? 0 points 0 points 0 points  What time? 0 points 0 points 0 points  Count back from 20 0 points 0  points 0 points  Months in reverse 0 points 0 points 0 points  Repeat phrase 4 points 0 points 0 points  Total Score 4 points 0 points 0 points    Immunizations Immunization History  Administered Date(s) Administered   Influenza, High Dose Seasonal PF 01/09/2019   Influenza, Quadrivalent, Recombinant, Inj, Pf 01/20/2018   Influenza,inj,Quad PF,6+ Mos 02/05/2017   Influenza-Unspecified 02/23/2016, 12/28/2019   PFIZER(Purple Top)SARS-COV-2 Vaccination 06/16/2019, 07/08/2019, 02/06/2020     Covid-19 vaccine status: Completed vaccines    Screening  Tests Health Maintenance  Topic Date Due   FOOT EXAM  06/19/2021   OPHTHALMOLOGY EXAM  09/09/2022 (Originally 02/05/2021)   COVID-19 Vaccine (4 - 2023-24 season) 09/25/2022 (Originally 12/06/2021)   Zoster Vaccines- Shingrix (1 of 2) 10/02/2022 (Originally 08/16/1969)   Pneumonia Vaccine 56+ Years old (1 of 1 - PCV) 07/02/2023 (Originally 08/15/2016)   DEXA SCAN  09/09/2023 (Originally 08/15/2016)   INFLUENZA VACCINE  11/06/2022   HEMOGLOBIN A1C  02/25/2023   Diabetic kidney evaluation - eGFR measurement  08/25/2023   Diabetic kidney evaluation - Urine ACR  08/25/2023   Medicare Annual Wellness (AWV)  09/09/2023   MAMMOGRAM  10/22/2023   Colonoscopy  06/19/2029   Hepatitis C Screening  Completed   HPV VACCINES  Aged Out   DTaP/Tdap/Td  Discontinued    Health Maintenance  Health Maintenance Due  Topic Date Due   FOOT EXAM  06/19/2021    Colorectal cancer screening: Type of screening: Colonoscopy. Completed 06/19/21. Repeat every   years  Mammogram status: Completed 10/21/21. Repeat every year  Bone Density status: Ordered Deferred. Pt provided with contact info and advised to call to schedule appt.  Lung Cancer Screening: (Low Dose CT Chest recommended if Age 71-80 years, 30 pack-year currently smoking OR have quit w/in 15years.) does not qualify.    Additional Screening:  Hepatitis C Screening: does qualify; Completed 112/13/18  Vision Screening: Recommended annual ophthalmology exams for early detection of glaucoma and other disorders of the eye. Is the patient up to date with their annual eye exam?  Yes  Who is the provider or what is the name of the office in which the patient attends annual eye exams? [facility] If pt is not established with a provider, would they like to be referred to a provider to establish care? No .   Dental Screening: Recommended annual dental exams for proper oral hygiene  Community Resource Referral / Chronic Care Management:  CRR required  this visit?  No   CCM required this visit?  No      Plan:     I have personally reviewed and noted the following in the patient's chart:   Medical and social history Use of alcohol, tobacco or illicit drugs  Current medications and supplements including opioid prescriptions. Patient is not currently taking opioid prescriptions. Functional ability and status Nutritional status Physical activity Advanced directives List of other physicians Hospitalizations, surgeries, and ER visits in previous 12 months Vitals Screenings to include cognitive, depression, and falls Referrals and appointments  In addition, I have reviewed and discussed with patient certain preventive protocols, quality metrics, and best practice recommendations. A written personalized care plan for preventive services as well as general preventive health recommendations were provided to patient.     Tillie Rung, LPN   11/11/5782   Nurse Notes: None

## 2022-09-10 ENCOUNTER — Ambulatory Visit (INDEPENDENT_AMBULATORY_CARE_PROVIDER_SITE_OTHER): Payer: Medicare Other | Admitting: Emergency Medicine

## 2022-09-10 ENCOUNTER — Encounter: Payer: Self-pay | Admitting: Emergency Medicine

## 2022-09-10 VITALS — BP 132/84 | HR 75 | Temp 97.5°F | Ht 67.0 in | Wt 249.4 lb

## 2022-09-10 DIAGNOSIS — R300 Dysuria: Secondary | ICD-10-CM | POA: Insufficient documentation

## 2022-09-10 DIAGNOSIS — R399 Unspecified symptoms and signs involving the genitourinary system: Secondary | ICD-10-CM

## 2022-09-10 LAB — POCT URINALYSIS DIPSTICK
Bilirubin, UA: NEGATIVE
Blood, UA: NEGATIVE
Glucose, UA: NEGATIVE
Ketones, UA: NEGATIVE
Leukocytes, UA: NEGATIVE
Nitrite, UA: NEGATIVE
Protein, UA: POSITIVE — AB
Spec Grav, UA: 1.025 (ref 1.010–1.025)
Urobilinogen, UA: 0.2 E.U./dL
pH, UA: 6 (ref 5.0–8.0)

## 2022-09-10 MED ORDER — CIPROFLOXACIN HCL 500 MG PO TABS: 500.0000 mg | ORAL_TABLET | Freq: Two times a day (BID) | ORAL | 0 refills | Status: AC

## 2022-09-10 NOTE — Progress Notes (Signed)
Janet Leblanc 72 y.o.   Chief Complaint  Patient presents with   Urinary Frequency    Being having UTI symptoms since Monday    Back Pain   Urinary Retention    HISTORY OF PRESENT ILLNESS: Acute problem visit today. This is a 72 y.o. female complaining of burning on urination and urinary retention since last Monday, 2 days ago.  Took Azo over-the-counter with symptom relief.  Denies gross hematuria.  Denies nausea or vomiting.  Denies fever or chills.  Has been having lumbar back pain since.  Denies abdominal pain. Denies any other associated symptoms No other complaints or medical concerns today.  Urinary Frequency  Associated symptoms include frequency. Pertinent negatives include no chills, nausea or vomiting.  Back Pain Pertinent negatives include no abdominal pain, chest pain, fever or headaches.     Prior to Admission medications   Medication Sig Start Date End Date Taking? Authorizing Provider  albuterol (VENTOLIN HFA) 108 (90 Base) MCG/ACT inhaler Inhale 2 puffs into the lungs every 6 (six) hours as needed for wheezing or shortness of breath. 06/28/22  Yes Allwardt, Alyssa M, PA-C  aspirin 81 MG tablet Take 81 mg by mouth daily.   Yes [provider]  Cholecalciferol (VITAMIN D3) 50 MCG (2000 UT) capsule Take 1 capsule (2,000 Units total) by mouth daily. 02/28/20  Yes Just, Azalee Course, FNP  diclofenac (VOLTAREN) 75 MG EC tablet Take 1 tablet (75 mg total) by mouth 2 (two) times daily. 07/02/22  Yes Daleysa Kristiansen, Eilleen Kempf, MD  levothyroxine (SYNTHROID) 50 MCG tablet TAKE 1 TABLET BY MOUTH EVERY  OTHER DAY 05/29/22  Yes Nyair Depaulo, Eilleen Kempf, MD  losartan (COZAAR) 50 MG tablet Take 1 tablet (50 mg total) by mouth daily. 02/24/22  Yes Louann Hopson, Eilleen Kempf, MD  Magnesium 500 MG CAPS Take 500 mg by mouth daily.   Yes [provider]  Multiple Vitamin (MULTI VITAMIN DAILY PO) Take 1 tablet by mouth.   Yes [provider]  omeprazole (PRILOSEC) 20 MG capsule  TAKE 1 CAPSULE BY MOUTH EVERY  OTHER DAY 09/02/22  Yes Meredith Pel, NP  POTASSIUM PO Take 1 tablet by mouth as needed.   Yes [provider]  rosuvastatin (CRESTOR) 10 MG tablet TAKE 1 TABLET BY MOUTH DAILY 09/01/22  Yes Georgina Quint, MD    Allergies  Allergen Reactions   Amoxicillin Rash   Penicillins Swelling    Patient Active Problem List   Diagnosis Date Noted   Chronic pain of left knee 07/02/2022   Stage 3a chronic kidney disease (HCC) 11/20/2021   Dyslipidemia 08/20/2021   Prediabetes 02/14/2021   Chronic venous insufficiency 02/14/2021   Hypothyroidism 02/14/2021   Class 2 severe obesity due to excess calories with serious comorbidity and body mass index (BMI) of 39.0 to 39.9 in adult [facility]) 02/14/2021   Type 2 diabetes mellitus without complication, without long-term current use of insulin (HCC) 02/28/2020   Other hyperlipidemia 02/28/2020   Vitamin D deficiency 02/28/2020   Essential hypertension 03/24/2016   Tinnitus of both ears 06/08/2014    Past Medical History:  Diagnosis Date   Allergy    seasonal   Arthritis    Hypertension    Thyroid disease    Vertigo     Past Surgical History:  Procedure Laterality Date   CHOLECYSTECTOMY     COLONOSCOPY  06/20/2019   FOOT SURGERY     GASTRIC BYPASS     15 years ago at least  TUBAL LIGATION      Social History   Socioeconomic History   Marital status: Married    Spouse name: Therron   Number of children: 2   Years of education: 12   Highest education level: Not on file  Occupational History   Occupation: Multimedia programmer   Tobacco Use   Smoking status: Former    Years: 15    Types: Cigarettes    Quit date: 1990    Years since quitting: 34.4   Smokeless tobacco: Never  Vaping Use   Vaping Use: Never used  Substance and Sexual Activity   Alcohol use: No    Alcohol/week: 0.0 standard drinks of alcohol   Drug use: No   Sexual activity: Not on file  Other Topics Concern   Not on  file  Social History Narrative   Not on file   Social Determinants of Health   Financial Resource Strain: Low Risk  (09/09/2022)   Overall Financial Resource Strain (CARDIA)    Difficulty of Paying Living Expenses: Not hard at all  Food Insecurity: No Food Insecurity (09/09/2022)   Hunger Vital Sign    Worried About Running Out of Food in the Last Year: Never true    Ran Out of Food in the Last Year: Never true  Transportation Needs: No Transportation Needs (09/09/2022)   PRAPARE - Administrator, Civil Service (Medical): No    Lack of Transportation (Non-Medical): No  Physical Activity: Inactive (09/09/2022)   Exercise Vital Sign    Days of Exercise per Week: 0 days    Minutes of Exercise per Session: 0 min  Stress: No Stress Concern Present (09/09/2022)   [employer] of Occupational Health - Occupational Stress Questionnaire    Feeling of Stress : Not at all  Social Connections: Socially Integrated (09/09/2022)   Social Connection and Isolation Panel [NHANES]    Frequency of Communication with Friends and Family: More than three times a week    Frequency of Social Gatherings with Friends and Family: More than three times a week    Attends Religious Services: More than 4 times per year    Active Member of [employer] or Organizations: Yes    Attends Engineer, structural: More than 4 times per year    Marital Status: Married  Catering manager Violence: Not At Risk (09/09/2022)   Humiliation, Afraid, Rape, and Kick questionnaire    Fear of Current or Ex-Partner: No    Emotionally Abused: No    Physically Abused: No    Sexually Abused: No    Family History  Problem Relation Age of Onset   Heart disease Father    Hypertension Father    Atrial fibrillation Sister    Heart disease Brother    Stroke Brother    Throat cancer Brother    Stroke Brother    Heart disease Paternal Grandmother    Cancer Paternal Grandfather    Migraines Neg Hx    Colon polyps Neg Hx     Colon cancer Neg Hx    Esophageal cancer Neg Hx    Rectal cancer Neg Hx    Stomach cancer Neg Hx      Review of Systems  Constitutional: Negative.  Negative for chills and fever.  HENT: Negative.  Negative for congestion and sore throat.   Respiratory: Negative.  Negative for cough and shortness of breath.   Cardiovascular: Negative.  Negative for chest pain and palpitations.  Gastrointestinal:  Negative for abdominal  pain, diarrhea, nausea and vomiting.  Genitourinary:  Positive for frequency.  Musculoskeletal:  Positive for back pain.  Skin: Negative.  Negative for rash.  Neurological:  Negative for dizziness and headaches.  All other systems reviewed and are negative.   Today's Vitals   09/10/22 1505  BP: 132/84  Pulse: 75  Temp: (!) 97.5 F (36.4 C)  TempSrc: Oral  SpO2: 94%  Weight: 249 lb 6 oz (113.1 kg)  Height: 5\' 7"  (1.702 m)   Body mass index is 39.06 kg/m.   Physical Exam Vitals reviewed.  Constitutional:      Appearance: Normal appearance.  HENT:     Head: Normocephalic.  Eyes:     Extraocular Movements: Extraocular movements intact.  Cardiovascular:     Rate and Rhythm: Normal rate.  Pulmonary:     Effort: Pulmonary effort is normal.  Abdominal:     Palpations: There is no mass.     Tenderness: There is no abdominal tenderness. There is no right CVA tenderness or left CVA tenderness.  Skin:    General: Skin is warm and dry.  Neurological:     Mental Status: She is alert and oriented to person, place, and time.  Psychiatric:        Mood and Affect: Mood normal.        Behavior: Behavior normal.     Results for orders placed or performed in visit on 09/10/22 (from the past 24 hour(s))  POCT Urinalysis Dipstick     Status: Abnormal   Collection Time: 09/10/22  3:15 PM  Result Value Ref Range   Color, UA amber    Clarity, UA cloudy    Glucose, UA Negative Negative   Bilirubin, UA negative    Ketones, UA negative    Spec Grav, UA 1.025  1.010 - 1.025   Blood, UA negative    pH, UA 6.0 5.0 - 8.0   Protein, UA Positive (A) Negative   Urobilinogen, UA 0.2 0.2 or 1.0 E.U./dL   Nitrite, UA negative    Leukocytes, UA Negative Negative   Appearance     Odor      ASSESSMENT & PLAN: A total of 33 minutes was spent with the patient and counseling/coordination of care regarding preparing for this visit, review of most recent office visit notes, review of most recent blood work results, review of multiple chronic medical conditions under management, review of all medications, diagnosis of possible UTI and need for antibiotics, prognosis, documentation, and need for follow-up.  Problem List Items Addressed This Visit       Other   Dysuria - Primary    Most likely secondary to UTI. Advised to stay well-hydrated and take Azo over-the-counter as needed Recommend to start Cipro 500 mg twice a day for 7 days Urine sent for culture today ED precautions given Advised to contact the office if no better or worse during the next several days      Relevant Medications   ciprofloxacin (CIPRO) 500 MG tablet   Other Visit Diagnoses     UTI symptoms       Relevant Medications   ciprofloxacin (CIPRO) 500 MG tablet   Other Relevant Orders   POCT Urinalysis Dipstick (Completed)   Urine Culture      Patient Instructions  Dysuria Dysuria is pain or discomfort during urination. The pain or discomfort may be felt in the part of the body that drains urine from the bladder (urethra) or in the surrounding tissue of the  genitals. The pain may also be felt in the groin area, lower abdomen, or lower back. You may have to urinate frequently or have the sudden feeling that you have to urinate (urgency). Dysuria can affect anyone, but it is more common in females. Dysuria can be caused by many different things, including: Urinary tract infection. Kidney stones or bladder stones. Certain STIs (sexually transmitted infections), such as  chlamydia. Dehydration. Inflammation of the tissues of the vagina. Use of certain medicines. Use of certain soaps or scented products that cause irritation. Follow these instructions at home: Medicines Take over-the-counter and prescription medicines only as told by your health care provider. If you were prescribed an antibiotic medicine, take it as told by your health care provider. Do not stop taking the antibiotic even if you start to feel better. Eating and drinking  Drink enough fluid to keep your urine pale yellow. Avoid caffeinated beverages, tea, and alcohol. These beverages can irritate the bladder and make dysuria worse. In males, alcohol may irritate the prostate. General instructions Watch your condition for any changes. Urinate often. Avoid holding urine for long periods of time. If you are female, you should wipe from front to back after urinating or having a bowel movement. Use each piece of toilet paper only once. Empty your bladder after sex. Keep all follow-up visits. This is important. If you had any tests done to find the cause of dysuria, it is up to you to get your test results. Ask your health care provider, or the department that is doing the test, when your results will be ready. Contact a health care provider if: You have a fever. You develop pain in your back or sides. You have nausea or vomiting. You have blood in your urine. You are not urinating as often as you usually do. Get help right away if: Your pain is severe and not relieved with medicines. You cannot eat or drink without vomiting. You are confused. You have a rapid heartbeat while resting. You have shaking or chills. You feel extremely weak. Summary Dysuria is pain or discomfort while urinating. Many different conditions can lead to dysuria. If you have dysuria, you may have to urinate frequently or have the sudden feeling that you have to urinate (urgency). Watch your condition for any  changes. Keep all follow-up visits. Make sure that you urinate often and drink enough fluid to keep your urine pale yellow. This information is not intended to replace advice given to you by your health care provider. Make sure you discuss any questions you have with your health care provider. Document Revised: 11/04/2019 Document Reviewed: 11/04/2019 Elsevier Patient Education  2024 Elsevier Inc.     Edwina Barth, MD [facility]

## 2022-09-10 NOTE — Patient Instructions (Signed)

## 2022-09-10 NOTE — Assessment & Plan Note (Signed)
Most likely secondary to UTI. Advised to stay well-hydrated and take Azo over-the-counter as needed Recommend to start Cipro 500 mg twice a day for 7 days Urine sent for culture today ED precautions given Advised to contact the office if no better or worse during the next several days

## 2022-09-12 LAB — URINE CULTURE: Result:: NO GROWTH

## 2022-09-30 DIAGNOSIS — H43822 Vitreomacular adhesion, left eye: Secondary | ICD-10-CM | POA: Diagnosis not present

## 2022-09-30 DIAGNOSIS — H35373 Puckering of macula, bilateral: Secondary | ICD-10-CM | POA: Diagnosis not present

## 2022-10-01 DIAGNOSIS — H35373 Puckering of macula, bilateral: Secondary | ICD-10-CM | POA: Diagnosis not present

## 2022-10-01 DIAGNOSIS — H2513 Age-related nuclear cataract, bilateral: Secondary | ICD-10-CM | POA: Diagnosis not present

## 2022-10-01 LAB — HM DIABETES EYE EXAM

## 2022-10-22 DIAGNOSIS — H35373 Puckering of macula, bilateral: Secondary | ICD-10-CM | POA: Diagnosis not present

## 2022-10-22 DIAGNOSIS — H25813 Combined forms of age-related cataract, bilateral: Secondary | ICD-10-CM | POA: Diagnosis not present

## 2022-11-07 DIAGNOSIS — H2513 Age-related nuclear cataract, bilateral: Secondary | ICD-10-CM | POA: Diagnosis not present

## 2022-11-07 DIAGNOSIS — I10 Essential (primary) hypertension: Secondary | ICD-10-CM | POA: Diagnosis not present

## 2022-11-07 DIAGNOSIS — H2511 Age-related nuclear cataract, right eye: Secondary | ICD-10-CM | POA: Diagnosis not present

## 2022-11-07 DIAGNOSIS — H35373 Puckering of macula, bilateral: Secondary | ICD-10-CM | POA: Diagnosis not present

## 2022-11-07 DIAGNOSIS — H25043 Posterior subcapsular polar age-related cataract, bilateral: Secondary | ICD-10-CM | POA: Diagnosis not present

## 2022-11-20 ENCOUNTER — Telehealth: Payer: Self-pay | Admitting: Emergency Medicine

## 2022-11-20 DIAGNOSIS — Z13820 Encounter for screening for osteoporosis: Secondary | ICD-10-CM

## 2022-11-20 NOTE — Telephone Encounter (Signed)
Janet Leblanc from [facility] called needing approval for a medication manufacturer change. Please advise.   levothyroxine (SYNTHROID) 50 MCG tablet

## 2022-11-20 NOTE — Telephone Encounter (Signed)
Order bone density scan.  Okay to refill omeprazole.  Thanks.

## 2022-11-20 NOTE — Telephone Encounter (Signed)
Pt requesting that she may need a bone density before 9.3 and wanted to see with her nurse or Dr for that request she mentioned they needed [place] request.  Pt is also wanting to know if she could get a refill on the medication below.  Omeprazone 20mg 

## 2022-11-21 MED ORDER — OMEPRAZOLE 20 MG PO CPDR: DELAYED_RELEASE_CAPSULE | ORAL | 0 refills | Status: DC

## 2022-11-21 NOTE — Telephone Encounter (Signed)
Called [facility]  to ok filled for patient medication

## 2022-11-21 NOTE — Telephone Encounter (Signed)
Bone density scan ordered and medication called to pharmacy.

## 2022-12-03 ENCOUNTER — Encounter: Payer: Self-pay | Admitting: Podiatry

## 2022-12-03 ENCOUNTER — Ambulatory Visit: Payer: Medicare Other | Admitting: Podiatry

## 2022-12-03 ENCOUNTER — Ambulatory Visit (INDEPENDENT_AMBULATORY_CARE_PROVIDER_SITE_OTHER): Payer: Medicare Other

## 2022-12-03 DIAGNOSIS — M21621 Bunionette of right foot: Secondary | ICD-10-CM | POA: Diagnosis not present

## 2022-12-03 DIAGNOSIS — M722 Plantar fascial fibromatosis: Secondary | ICD-10-CM

## 2022-12-03 MED ORDER — TRIAMCINOLONE ACETONIDE 10 MG/ML IJ SUSP
10.0000 mg | Freq: Once | INTRAMUSCULAR | Status: AC
  Administered 2022-12-03: 10 mg via INTRA_ARTICULAR

## 2022-12-03 NOTE — Patient Instructions (Signed)

## 2022-12-03 NOTE — Progress Notes (Signed)
Subjective:   Patient ID: Janet Leblanc, female   DOB: 72 y.o.   MRN: [id number]   HPI Patient presents stating she has a lot of pain in the right heel that started around a month ago and stated did very well with her previous surgery   ROS      Objective:  Physical Exam  Neurovascular status intact exquisite discomfort medial fascial band right at the insertional point tendon calcaneus inflammation fluid around the band     Assessment:  Acute plantar fasciitis right with inflammation     Plan:  Sterile prep injected the plantar fascia right at insertion 3 mg Kenalog 5 mg Xylocaine and advised on supportive shoes and reappoint to recheck.  Also went ahead today and placed patient into a fascial brace to lift up the arch which was fitted appropriately to take all pressure off the arch surface  X-rays do indicate spur formation moderate depression of the arch no indication stress fracture arthritis

## 2022-12-09 DIAGNOSIS — M8588 Other specified disorders of bone density and structure, other site: Secondary | ICD-10-CM | POA: Diagnosis not present

## 2022-12-09 DIAGNOSIS — E349 Endocrine disorder, unspecified: Secondary | ICD-10-CM | POA: Diagnosis not present

## 2022-12-09 DIAGNOSIS — Z1231 Encounter for screening mammogram for malignant neoplasm of breast: Secondary | ICD-10-CM | POA: Diagnosis not present

## 2022-12-09 LAB — HM MAMMOGRAPHY

## 2022-12-09 LAB — HM DEXA SCAN

## 2022-12-11 ENCOUNTER — Encounter: Payer: Self-pay | Admitting: Emergency Medicine

## 2022-12-11 ENCOUNTER — Other Ambulatory Visit: Payer: Self-pay | Admitting: Emergency Medicine

## 2022-12-11 DIAGNOSIS — M810 Age-related osteoporosis without current pathological fracture: Secondary | ICD-10-CM

## 2022-12-11 MED ORDER — ALENDRONATE SODIUM 70 MG PO TABS: 70.0000 mg | ORAL_TABLET | ORAL | 3 refills | Status: DC

## 2022-12-17 ENCOUNTER — Encounter: Payer: Self-pay | Admitting: Podiatry

## 2022-12-17 ENCOUNTER — Ambulatory Visit: Payer: Medicare Other | Admitting: Podiatry

## 2022-12-17 DIAGNOSIS — M722 Plantar fascial fibromatosis: Secondary | ICD-10-CM

## 2022-12-17 NOTE — Progress Notes (Signed)
Subjective:   Patient ID: Janet Leblanc, female   DOB: 72 y.o.   MRN: [id number]   HPI Patient states improved quite a bit still having very mild discomfort but much better   ROS      Objective:  Physical Exam  Neurovascular status intact with diminishment of discomfort plantar aspect right heel insertional point tendon calcaneus     Assessment:  Acute plantar fasciitis right improved     Plan:  H&P reviewed recommended good support shoes heel lift treatment and stretching.  Reappoint as symptoms indicate

## 2022-12-24 ENCOUNTER — Ambulatory Visit: Payer: Medicare Other | Admitting: Podiatry

## 2022-12-24 ENCOUNTER — Encounter: Payer: Self-pay | Admitting: Podiatry

## 2022-12-24 DIAGNOSIS — M722 Plantar fascial fibromatosis: Secondary | ICD-10-CM

## 2022-12-24 MED ORDER — TRIAMCINOLONE ACETONIDE 10 MG/ML IJ SUSP
10.0000 mg | Freq: Once | INTRAMUSCULAR | Status: AC
  Administered 2022-12-24: 10 mg via INTRA_ARTICULAR

## 2022-12-24 NOTE — Progress Notes (Signed)
Subjective:   Patient ID: Janet Leblanc, female   DOB: 72 y.o.   MRN: [id number]   HPI Patient presents with a lot of pain in the outside of the right foot plantar and states the inside seems better   ROS      Objective:  Physical Exam  Neurovascular status intact inflammation of the plantar fascia right lateral side fluid buildup     Assessment:  Lateral band plantar fasciitis right     Plan:  Reviewed sterile prep injected from the lateral side 3 mg dexamethasone Kenalog 5 mg Xylocaine discussed possibility for immobilization and if she does not have a boot we will have to get a boot for her.  Patient will be seen back to recheck as needed

## 2023-01-22 DIAGNOSIS — H2511 Age-related nuclear cataract, right eye: Secondary | ICD-10-CM | POA: Diagnosis not present

## 2023-01-22 DIAGNOSIS — H25813 Combined forms of age-related cataract, bilateral: Secondary | ICD-10-CM | POA: Diagnosis not present

## 2023-01-23 DIAGNOSIS — H2512 Age-related nuclear cataract, left eye: Secondary | ICD-10-CM | POA: Diagnosis not present

## 2023-01-23 DIAGNOSIS — H25042 Posterior subcapsular polar age-related cataract, left eye: Secondary | ICD-10-CM | POA: Diagnosis not present

## 2023-01-29 ENCOUNTER — Other Ambulatory Visit: Payer: Self-pay | Admitting: Emergency Medicine

## 2023-01-29 DIAGNOSIS — E039 Hypothyroidism, unspecified: Secondary | ICD-10-CM

## 2023-02-03 DIAGNOSIS — H2512 Age-related nuclear cataract, left eye: Secondary | ICD-10-CM | POA: Diagnosis not present

## 2023-02-03 DIAGNOSIS — H35373 Puckering of macula, bilateral: Secondary | ICD-10-CM | POA: Diagnosis not present

## 2023-02-12 DIAGNOSIS — H25813 Combined forms of age-related cataract, bilateral: Secondary | ICD-10-CM | POA: Diagnosis not present

## 2023-02-12 DIAGNOSIS — H2512 Age-related nuclear cataract, left eye: Secondary | ICD-10-CM | POA: Diagnosis not present

## 2023-02-13 ENCOUNTER — Other Ambulatory Visit: Payer: Self-pay | Admitting: Emergency Medicine

## 2023-02-13 ENCOUNTER — Other Ambulatory Visit: Payer: Self-pay | Admitting: Nurse Practitioner

## 2023-02-13 DIAGNOSIS — I10 Essential (primary) hypertension: Secondary | ICD-10-CM

## 2023-02-24 DIAGNOSIS — H35373 Puckering of macula, bilateral: Secondary | ICD-10-CM | POA: Diagnosis not present

## 2023-02-25 ENCOUNTER — Encounter: Payer: Self-pay | Admitting: Emergency Medicine

## 2023-02-25 ENCOUNTER — Ambulatory Visit (INDEPENDENT_AMBULATORY_CARE_PROVIDER_SITE_OTHER): Payer: Medicare Other | Admitting: Emergency Medicine

## 2023-02-25 VITALS — BP 128/80 | HR 78 | Temp 97.6°F | Ht 67.0 in | Wt 241.0 lb

## 2023-02-25 DIAGNOSIS — I10 Essential (primary) hypertension: Secondary | ICD-10-CM | POA: Diagnosis not present

## 2023-02-25 DIAGNOSIS — I872 Venous insufficiency (chronic) (peripheral): Secondary | ICD-10-CM | POA: Diagnosis not present

## 2023-02-25 DIAGNOSIS — N1831 Chronic kidney disease, stage 3a: Secondary | ICD-10-CM

## 2023-02-25 DIAGNOSIS — E66812 Obesity, class 2: Secondary | ICD-10-CM

## 2023-02-25 DIAGNOSIS — E119 Type 2 diabetes mellitus without complications: Secondary | ICD-10-CM

## 2023-02-25 DIAGNOSIS — M810 Age-related osteoporosis without current pathological fracture: Secondary | ICD-10-CM | POA: Insufficient documentation

## 2023-02-25 DIAGNOSIS — E785 Hyperlipidemia, unspecified: Secondary | ICD-10-CM

## 2023-02-25 DIAGNOSIS — E039 Hypothyroidism, unspecified: Secondary | ICD-10-CM | POA: Diagnosis not present

## 2023-02-25 DIAGNOSIS — R7303 Prediabetes: Secondary | ICD-10-CM

## 2023-02-25 DIAGNOSIS — Z6839 Body mass index (BMI) 39.0-39.9, adult: Secondary | ICD-10-CM

## 2023-02-25 LAB — POCT GLYCOSYLATED HEMOGLOBIN (HGB A1C): Hemoglobin A1C: 6 % — AB (ref 4.0–5.6)

## 2023-02-25 NOTE — Patient Instructions (Signed)
Health Maintenance After Age 71 After age 71, you are at a higher risk for certain long-term diseases and infections as well as injuries from falls. Falls are a major cause of broken bones and head injuries in people who are older than age 71. Getting regular preventive care can help to keep you healthy and well. Preventive care includes getting regular testing and making lifestyle changes as recommended by your health care provider. Talk with your health care provider about: Which screenings and tests you should have. A screening is a test that checks for a disease when you have no symptoms. A diet and exercise plan that is right for you. What should I know about screenings and tests to prevent falls? Screening and testing are the best ways to find a health problem early. Early diagnosis and treatment give you the best chance of managing medical conditions that are common after age 71. Certain conditions and lifestyle choices may make you more likely to have a fall. Your health care provider may recommend: Regular vision checks. Poor vision and conditions such as cataracts can make you more likely to have a fall. If you wear glasses, make sure to get your prescription updated if your vision changes. Medicine review. Work with your health care provider to regularly review all of the medicines you are taking, including over-the-counter medicines. Ask your health care provider about any side effects that may make you more likely to have a fall. Tell your health care provider if any medicines that you take make you feel dizzy or sleepy. Strength and balance checks. Your health care provider may recommend certain tests to check your strength and balance while standing, walking, or changing positions. Foot health exam. Foot pain and numbness, as well as not wearing proper footwear, can make you more likely to have a fall. Screenings, including: Osteoporosis screening. Osteoporosis is a condition that causes  the bones to get weaker and break more easily. Blood pressure screening. Blood pressure changes and medicines to control blood pressure can make you feel dizzy. Depression screening. You may be more likely to have a fall if you have a fear of falling, feel depressed, or feel unable to do activities that you used to do. Alcohol use screening. Using too much alcohol can affect your balance and may make you more likely to have a fall. Follow these instructions at home: Lifestyle Do not drink alcohol if: Your health care provider tells you not to drink. If you drink alcohol: Limit how much you have to: 0-1 drink a day for women. 0-2 drinks a day for men. Know how much alcohol is in your drink. In the [place] one drink equals one 12 oz bottle of beer (355 mL), one 5 oz glass of wine (148 mL), or one 1 oz glass of hard liquor (44 mL). Do not use any products that contain nicotine or tobacco. These products include cigarettes, chewing tobacco, and vaping devices, such as e-cigarettes. If you need help quitting, ask your health care provider. Activity  Follow a regular exercise program to stay fit. This will help you maintain your balance. Ask your health care provider what types of exercise are appropriate for you. If you need a cane or walker, use it as recommended by your health care provider. Wear supportive shoes that have nonskid soles. Safety  Remove any tripping hazards, such as rugs, cords, and clutter. Install safety equipment such as grab bars in bathrooms and safety rails on stairs. Keep rooms and walkways   well-lit. General instructions Talk with your health care provider about your risks for falling. Tell your health care provider if: You fall. Be sure to tell your health care provider about all falls, even ones that seem minor. You feel dizzy, tiredness (fatigue), or off-balance. Take over-the-counter and prescription medicines only as told by your health care provider. These include  supplements. Eat a healthy diet and maintain a healthy weight. A healthy diet includes low-fat dairy products, low-fat (lean) meats, and fiber from whole grains, beans, and lots of fruits and vegetables. Stay current with your vaccines. Schedule regular health, dental, and eye exams. Summary Having a healthy lifestyle and getting preventive care can help to protect your health and wellness after age 71. Screening and testing are the best way to find a health problem early and help you avoid having a fall. Early diagnosis and treatment give you the best chance for managing medical conditions that are more common for people who are older than age 71. Falls are a major cause of broken bones and head injuries in people who are older than age 71. Take precautions to prevent a fall at home. Work with your health care provider to learn what changes you can make to improve your health and wellness and to prevent falls. This information is not intended to replace advice given to you by your health care provider. Make sure you discuss any questions you have with your health care provider. Document Revised: 08/13/2020 Document Reviewed: 08/13/2020 Elsevier Patient Education  2024 Elsevier Inc.  

## 2023-02-25 NOTE — Assessment & Plan Note (Signed)
Started weekly Fosamax 70 mg Getting what she thinks are side effects Complaining of intermittent bilateral lower leg cramping

## 2023-02-25 NOTE — Assessment & Plan Note (Signed)
Well-controlled hypertension Continue losartan 50 mg daily Cardiovascular risks associated with hypertension discussed Diet approaches to stop hypertension discussed

## 2023-02-25 NOTE — Assessment & Plan Note (Signed)
Stable off medications. Hemoglobin A1c of 6.0 Diet and nutrition discussed

## 2023-02-25 NOTE — Progress Notes (Signed)
Janet Leblanc 72 y.o.   Chief Complaint  Patient presents with   Medical Management of Chronic Issues    6 month f/u for HTN. Patient wants to talk to about the Prolia. And has had cataract procedure.    HISTORY OF PRESENT ILLNESS: This is a 72 y.o. female here for 32-month follow-up of chronic medical conditions DEXA scan recently showed osteoporosis.  Was started on Fosamax weekly Having some side effects related to cramping in her legs.  Not sure if it is the Fosamax or not. Also recently had cataract procedure No other complaints or medical concerns today. BP Readings from Last 3 Encounters:  02/25/23 128/80  09/10/22 132/84  08/25/22 108/70   Wt Readings from Last 3 Encounters:  02/25/23 241 lb (109.3 kg)  09/10/22 249 lb 6 oz (113.1 kg)  09/09/22 246 lb (111.6 kg)     HPI   Prior to Admission medications   Medication Sig Start Date End Date Taking? Authorizing Provider  albuterol (VENTOLIN HFA) 108 (90 Base) MCG/ACT inhaler Inhale 2 puffs into the lungs every 6 (six) hours as needed for wheezing or shortness of breath. 06/28/22   Allwardt, Alyssa M, PA-C  alendronate (FOSAMAX) 70 MG tablet Take 1 tablet (70 mg total) by mouth every 7 (seven) days. Take with a full glass of water on an empty stomach. 12/11/22   Georgina Quint, MD  aspirin 81 MG tablet Take 81 mg by mouth daily.    [provider]  Cholecalciferol (VITAMIN D3) 50 MCG (2000 UT) capsule Take 1 capsule (2,000 Units total) by mouth daily. 02/28/20   Just, Azalee Course, FNP  diclofenac (VOLTAREN) 75 MG EC tablet Take 1 tablet (75 mg total) by mouth 2 (two) times daily. 07/02/22   Georgina Quint, MD  levothyroxine (SYNTHROID) 50 MCG tablet TAKE 1 TABLET BY MOUTH EVERY  OTHER DAY 01/29/23   Georgina Quint, MD  losartan (COZAAR) 50 MG tablet TAKE 1 TABLET BY MOUTH DAILY 02/14/23   Georgina Quint, MD  Magnesium 500 MG CAPS Take 500 mg by mouth daily.    [provider]  Multiple  Vitamin (MULTI VITAMIN DAILY PO) Take 1 tablet by mouth.    [provider]  omeprazole (PRILOSEC) 20 MG capsule Take one capsule by mouth every other day. 11/21/22   Georgina Quint, MD  POTASSIUM PO Take 1 tablet by mouth as needed.    [provider]  rosuvastatin (CRESTOR) 10 MG tablet TAKE 1 TABLET BY MOUTH DAILY 09/01/22   Georgina Quint, MD    Allergies  Allergen Reactions   Amoxicillin Rash   Penicillins Swelling    Patient Active Problem List   Diagnosis Date Noted   Chronic pain of left knee 07/02/2022   Stage 3a chronic kidney disease (HCC) 11/20/2021   Dyslipidemia 08/20/2021   Prediabetes 02/14/2021   Chronic venous insufficiency 02/14/2021   Hypothyroidism 02/14/2021   Class 2 severe obesity due to excess calories with serious comorbidity and body mass index (BMI) of 39.0 to 39.9 in adult [facility]) 02/14/2021   Type 2 diabetes mellitus without complication, without long-term current use of insulin (HCC) 02/28/2020   Other hyperlipidemia 02/28/2020   Vitamin D deficiency 02/28/2020   Essential hypertension 03/24/2016    Past Medical History:  Diagnosis Date   Allergy    seasonal   Arthritis    Hypertension    Thyroid disease    Vertigo     Past Surgical History:  Procedure Laterality Date   CHOLECYSTECTOMY     COLONOSCOPY  06/20/2019   FOOT SURGERY     GASTRIC BYPASS     15 years ago at least   TUBAL LIGATION      Social History   Socioeconomic History   Marital status: Married    Spouse name: Therron   Number of children: 2   Years of education: 12   Highest education level: Not on file  Occupational History   Occupation: Multimedia programmer   Tobacco Use   Smoking status: Former    Current packs/day: 0.00    Types: Cigarettes    Start date: 1975    Quit date: 1990    Years since quitting: 34.9   Smokeless tobacco: Never  Vaping Use   Vaping status: Never Used  Substance and Sexual Activity   Alcohol use: No     Alcohol/week: 0.0 standard drinks of alcohol   Drug use: No   Sexual activity: Not on file  Other Topics Concern   Not on file  Social History Narrative   Not on file   Social Determinants of Health   Financial Resource Strain: Low Risk  (09/09/2022)   Overall Financial Resource Strain (CARDIA)    Difficulty of Paying Living Expenses: Not hard at all  Food Insecurity: No Food Insecurity (09/09/2022)   Hunger Vital Sign    Worried About Running Out of Food in the Last Year: Never true    Ran Out of Food in the Last Year: Never true  Transportation Needs: No Transportation Needs (09/09/2022)   PRAPARE - Administrator, Civil Service (Medical): No    Lack of Transportation (Non-Medical): No  Physical Activity: Inactive (09/09/2022)   Exercise Vital Sign    Days of Exercise per Week: 0 days    Minutes of Exercise per Session: 0 min  Stress: No Stress Concern Present (09/09/2022)   [employer] of Occupational Health - Occupational Stress Questionnaire    Feeling of Stress : Not at all  Social Connections: Socially Integrated (09/09/2022)   Social Connection and Isolation Panel [NHANES]    Frequency of Communication with Friends and Family: More than three times a week    Frequency of Social Gatherings with Friends and Family: More than three times a week    Attends Religious Services: More than 4 times per year    Active Member of [employer] or Organizations: Yes    Attends Engineer, structural: More than 4 times per year    Marital Status: Married  Catering manager Violence: Not At Risk (09/09/2022)   Humiliation, Afraid, Rape, and Kick questionnaire    Fear of Current or Ex-Partner: No    Emotionally Abused: No    Physically Abused: No    Sexually Abused: No    Family History  Problem Relation Age of Onset   Heart disease Father    Hypertension Father    Atrial fibrillation Sister    Heart disease Brother    Stroke Brother    Throat cancer Brother    Stroke  Brother    Heart disease Paternal Grandmother    Cancer Paternal Grandfather    Migraines Neg Hx    Colon polyps Neg Hx    Colon cancer Neg Hx    Esophageal cancer Neg Hx    Rectal cancer Neg Hx    Stomach cancer Neg Hx      Review of Systems  Constitutional: Negative.  Negative for fever.  HENT: Negative.  Negative for congestion and sore throat.   Respiratory: Negative.  Negative for cough and shortness of breath.   Cardiovascular: Negative.  Negative for chest pain and palpitations.  Gastrointestinal:  Negative for abdominal pain, diarrhea, nausea and vomiting.  Genitourinary: Negative.  Negative for dysuria and hematuria.  Musculoskeletal:        Occasional leg cramps  Skin: Negative.  Negative for rash.  Neurological: Negative.  Negative for dizziness and headaches.  All other systems reviewed and are negative.   Today's Vitals   02/25/23 1256  BP: 128/80  Pulse: 78  Temp: 97.6 F (36.4 C)  TempSrc: Oral  SpO2: 96%  Weight: 241 lb (109.3 kg)  Height: 5\' 7"  (1.702 m)   Body mass index is 37.75 kg/m.   Physical Exam Vitals reviewed.  Constitutional:      Appearance: Normal appearance.  HENT:     Head: Normocephalic.  Eyes:     Extraocular Movements: Extraocular movements intact.     Pupils: Pupils are equal, round, and reactive to light.  Cardiovascular:     Rate and Rhythm: Normal rate.     Pulses: Normal pulses.     Heart sounds: Normal heart sounds.  Pulmonary:     Effort: Pulmonary effort is normal.     Breath sounds: Normal breath sounds.  Musculoskeletal:     Cervical back: No tenderness.  Lymphadenopathy:     Cervical: No cervical adenopathy.  Skin:    General: Skin is warm and dry.  Neurological:     General: No focal deficit present.     Mental Status: She is alert and oriented to person, place, and time.  Psychiatric:        Mood and Affect: Mood normal.        Behavior: Behavior normal.      ASSESSMENT & PLAN: A total of 45  minutes was spent with the patient and counseling/coordination of care regarding preparing for this visit, review of most recent office visit notes, review of multiple chronic medical conditions under management, review of all medications, review of most recent blood work results, cardiovascular risks associated with hypertension, prognosis, documentation, and need for follow-up.  Problem List Items Addressed This Visit       Cardiovascular and Mediastinum   Essential hypertension - Primary    Well-controlled hypertension Continue losartan 50 mg daily Cardiovascular risks associated with hypertension discussed Diet approaches to stop hypertension discussed      Chronic venous insufficiency    Stable without worsening edema or infection         Endocrine   Type 2 diabetes mellitus without complication, without long-term current use of insulin (HCC)    Stable off medications. Hemoglobin A1c of 6.0 Diet and nutrition discussed      Hypothyroidism    Chronic stable condition Continue levothyroxine 50 mcg daily Lab Results  Component Value Date   TSH 2.78 08/25/2022           Musculoskeletal and Integument   Age-related osteoporosis without current pathological fracture    Started weekly Fosamax 70 mg Getting what she thinks are side effects Complaining of intermittent bilateral lower leg cramping        Genitourinary   Stage 3a chronic kidney disease (HCC)    Chronic stable condition GFR improved to 59 Advised to stay well-hydrated and avoid NSAIDs        Other   Prediabetes   Relevant Orders   POCT HgB A1C (Completed)  Class 2 severe obesity due to excess calories with serious comorbidity and body mass index (BMI) of 39.0 to 39.9 in adult [facility])   Dyslipidemia    Chronic stable condition May be having side effects from rosuvastatin Advised to stop medication for 1 to 2 weeks and monitor side effects      Patient Instructions  Health Maintenance After Age  17 After age 80, you are at a higher risk for certain long-term diseases and infections as well as injuries from falls. Falls are a major cause of broken bones and head injuries in people who are older than age 27. Getting regular preventive care can help to keep you healthy and well. Preventive care includes getting regular testing and making lifestyle changes as recommended by your health care provider. Talk with your health care provider about: Which screenings and tests you should have. A screening is a test that checks for a disease when you have no symptoms. A diet and exercise plan that is right for you. What should I know about screenings and tests to prevent falls? Screening and testing are the best ways to find a health problem early. Early diagnosis and treatment give you the best chance of managing medical conditions that are common after age 89. Certain conditions and lifestyle choices may make you more likely to have a fall. Your health care provider may recommend: Regular vision checks. Poor vision and conditions such as cataracts can make you more likely to have a fall. If you wear glasses, make sure to get your prescription updated if your vision changes. Medicine review. Work with your health care provider to regularly review all of the medicines you are taking, including over-the-counter medicines. Ask your health care provider about any side effects that may make you more likely to have a fall. Tell your health care provider if any medicines that you take make you feel dizzy or sleepy. Strength and balance checks. Your health care provider may recommend certain tests to check your strength and balance while standing, walking, or changing positions. Foot health exam. Foot pain and numbness, as well as not wearing proper footwear, can make you more likely to have a fall. Screenings, including: Osteoporosis screening. Osteoporosis is a condition that causes the bones to get weaker and  break more easily. Blood pressure screening. Blood pressure changes and medicines to control blood pressure can make you feel dizzy. Depression screening. You may be more likely to have a fall if you have a fear of falling, feel depressed, or feel unable to do activities that you used to do. Alcohol use screening. Using too much alcohol can affect your balance and may make you more likely to have a fall. Follow these instructions at home: Lifestyle Do not drink alcohol if: Your health care provider tells you not to drink. If you drink alcohol: Limit how much you have to: 0-1 drink a day for women. 0-2 drinks a day for men. Know how much alcohol is in your drink. In the [place] one drink equals one 12 oz bottle of beer (355 mL), one 5 oz glass of wine (148 mL), or one 1 oz glass of hard liquor (44 mL). Do not use any products that contain nicotine or tobacco. These products include cigarettes, chewing tobacco, and vaping devices, such as e-cigarettes. If you need help quitting, ask your health care provider. Activity  Follow a regular exercise program to stay fit. This will help you maintain your balance. Ask your health care provider  what types of exercise are appropriate for you. If you need a cane or walker, use it as recommended by your health care provider. Wear supportive shoes that have nonskid soles. Safety  Remove any tripping hazards, such as rugs, cords, and clutter. Install safety equipment such as grab bars in bathrooms and safety rails on stairs. Keep rooms and walkways well-lit. General instructions Talk with your health care provider about your risks for falling. Tell your health care provider if: You fall. Be sure to tell your health care provider about all falls, even ones that seem minor. You feel dizzy, tiredness (fatigue), or off-balance. Take over-the-counter and prescription medicines only as told by your health care provider. These include supplements. Eat a healthy  diet and maintain a healthy weight. A healthy diet includes low-fat dairy products, low-fat (lean) meats, and fiber from whole grains, beans, and lots of fruits and vegetables. Stay current with your vaccines. Schedule regular health, dental, and eye exams. Summary Having a healthy lifestyle and getting preventive care can help to protect your health and wellness after age 48. Screening and testing are the best way to find a health problem early and help you avoid having a fall. Early diagnosis and treatment give you the best chance for managing medical conditions that are more common for people who are older than age 50. Falls are a major cause of broken bones and head injuries in people who are older than age 72. Take precautions to prevent a fall at home. Work with your health care provider to learn what changes you can make to improve your health and wellness and to prevent falls. This information is not intended to replace advice given to you by your health care provider. Make sure you discuss any questions you have with your health care provider. Document Revised: 08/13/2020 Document Reviewed: 08/13/2020 Elsevier Patient Education  2024 Elsevier Inc.     Edwina Barth, MD [facility]

## 2023-02-25 NOTE — Assessment & Plan Note (Signed)
Chronic stable condition May be having side effects from rosuvastatin Advised to stop medication for 1 to 2 weeks and monitor side effects

## 2023-02-25 NOTE — Assessment & Plan Note (Signed)
Chronic stable condition GFR improved to 59 Advised to stay well-hydrated and avoid NSAIDs

## 2023-02-25 NOTE — Assessment & Plan Note (Signed)
Chronic stable condition Continue levothyroxine 50 mcg daily Lab Results  Component Value Date   TSH 2.78 08/25/2022

## 2023-02-25 NOTE — Addendum Note (Signed)
Addended by: Evie Lacks on: 02/25/2023 04:42 PM   Modules accepted: Level of Service

## 2023-02-25 NOTE — Assessment & Plan Note (Signed)
Stable without worsening edema or infection

## 2023-04-16 ENCOUNTER — Other Ambulatory Visit: Payer: Self-pay | Admitting: Emergency Medicine

## 2023-04-16 DIAGNOSIS — E7849 Other hyperlipidemia: Secondary | ICD-10-CM

## 2023-05-12 DIAGNOSIS — H02833 Dermatochalasis of right eye, unspecified eyelid: Secondary | ICD-10-CM | POA: Diagnosis not present

## 2023-05-12 DIAGNOSIS — H02836 Dermatochalasis of left eye, unspecified eyelid: Secondary | ICD-10-CM | POA: Diagnosis not present

## 2023-05-12 DIAGNOSIS — H35373 Puckering of macula, bilateral: Secondary | ICD-10-CM | POA: Diagnosis not present

## 2023-05-26 DIAGNOSIS — H1011 Acute atopic conjunctivitis, right eye: Secondary | ICD-10-CM | POA: Diagnosis not present

## 2023-05-26 DIAGNOSIS — H02836 Dermatochalasis of left eye, unspecified eyelid: Secondary | ICD-10-CM | POA: Diagnosis not present

## 2023-05-26 DIAGNOSIS — H35371 Puckering of macula, right eye: Secondary | ICD-10-CM | POA: Diagnosis not present

## 2023-05-26 DIAGNOSIS — H02833 Dermatochalasis of right eye, unspecified eyelid: Secondary | ICD-10-CM | POA: Diagnosis not present

## 2023-06-17 DIAGNOSIS — M720 Palmar fascial fibromatosis [Dupuytren]: Secondary | ICD-10-CM | POA: Diagnosis not present

## 2023-06-17 DIAGNOSIS — M65322 Trigger finger, left index finger: Secondary | ICD-10-CM | POA: Diagnosis not present

## 2023-06-17 DIAGNOSIS — Z4789 Encounter for other orthopedic aftercare: Secondary | ICD-10-CM | POA: Diagnosis not present

## 2023-06-17 DIAGNOSIS — M65332 Trigger finger, left middle finger: Secondary | ICD-10-CM | POA: Diagnosis not present

## 2023-08-18 ENCOUNTER — Ambulatory Visit (INDEPENDENT_AMBULATORY_CARE_PROVIDER_SITE_OTHER): Payer: Medicare Other | Admitting: Emergency Medicine

## 2023-08-18 ENCOUNTER — Encounter: Payer: Self-pay | Admitting: Emergency Medicine

## 2023-08-18 VITALS — BP 124/78 | HR 63 | Temp 98.4°F | Ht 67.0 in | Wt 239.0 lb

## 2023-08-18 DIAGNOSIS — N1831 Chronic kidney disease, stage 3a: Secondary | ICD-10-CM

## 2023-08-18 DIAGNOSIS — E039 Hypothyroidism, unspecified: Secondary | ICD-10-CM

## 2023-08-18 DIAGNOSIS — I872 Venous insufficiency (chronic) (peripheral): Secondary | ICD-10-CM | POA: Diagnosis not present

## 2023-08-18 DIAGNOSIS — M810 Age-related osteoporosis without current pathological fracture: Secondary | ICD-10-CM | POA: Diagnosis not present

## 2023-08-18 DIAGNOSIS — L987 Excessive and redundant skin and subcutaneous tissue: Secondary | ICD-10-CM | POA: Diagnosis not present

## 2023-08-18 DIAGNOSIS — Z6839 Body mass index (BMI) 39.0-39.9, adult: Secondary | ICD-10-CM

## 2023-08-18 DIAGNOSIS — E66812 Obesity, class 2: Secondary | ICD-10-CM | POA: Diagnosis not present

## 2023-08-18 DIAGNOSIS — I10 Essential (primary) hypertension: Secondary | ICD-10-CM | POA: Diagnosis not present

## 2023-08-18 MED ORDER — TIRZEPATIDE-WEIGHT MANAGEMENT 2.5 MG/0.5ML ~~LOC~~ SOLN: 2.5000 mg | SUBCUTANEOUS | 3 refills | Status: DC

## 2023-08-18 NOTE — Patient Instructions (Signed)
 Health Maintenance After Age 72 After age 4, you are at a higher risk for certain long-term diseases and infections as well as injuries from falls. Falls are a major cause of broken bones and head injuries in people who are older than age 47. Getting regular preventive care can help to keep you healthy and well. Preventive care includes getting regular testing and making lifestyle changes as recommended by your health care provider. Talk with your health care provider about: Which screenings and tests you should have. A screening is a test that checks for a disease when you have no symptoms. A diet and exercise plan that is right for you. What should I know about screenings and tests to prevent falls? Screening and testing are the best ways to find a health problem early. Early diagnosis and treatment give you the best chance of managing medical conditions that are common after age 37. Certain conditions and lifestyle choices may make you more likely to have a fall. Your health care provider may recommend: Regular vision checks. Poor vision and conditions such as cataracts can make you more likely to have a fall. If you wear glasses, make sure to get your prescription updated if your vision changes. Medicine review. Work with your health care provider to regularly review all of the medicines you are taking, including over-the-counter medicines. Ask your health care provider about any side effects that may make you more likely to have a fall. Tell your health care provider if any medicines that you take make you feel dizzy or sleepy. Strength and balance checks. Your health care provider may recommend certain tests to check your strength and balance while standing, walking, or changing positions. Foot health exam. Foot pain and numbness, as well as not wearing proper footwear, can make you more likely to have a fall. Screenings, including: Osteoporosis screening. Osteoporosis is a condition that causes  the bones to get weaker and break more easily. Blood pressure screening. Blood pressure changes and medicines to control blood pressure can make you feel dizzy. Depression screening. You may be more likely to have a fall if you have a fear of falling, feel depressed, or feel unable to do activities that you used to do. Alcohol use screening. Using too much alcohol can affect your balance and may make you more likely to have a fall. Follow these instructions at home: Lifestyle Do not drink alcohol if: Your health care provider tells you not to drink. If you drink alcohol: Limit how much you have to: 0-1 drink a day for women. 0-2 drinks a day for men. Know how much alcohol is in your drink. In the [place] one drink equals one 12 oz bottle of beer (355 mL), one 5 oz glass of wine (148 mL), or one 1 oz glass of hard liquor (44 mL). Do not use any products that contain nicotine or tobacco. These products include cigarettes, chewing tobacco, and vaping devices, such as e-cigarettes. If you need help quitting, ask your health care provider. Activity  Follow a regular exercise program to stay fit. This will help you maintain your balance. Ask your health care provider what types of exercise are appropriate for you. If you need a cane or walker, use it as recommended by your health care provider. Wear supportive shoes that have nonskid soles. Safety  Remove any tripping hazards, such as rugs, cords, and clutter. Install safety equipment such as grab bars in bathrooms and safety rails on stairs. Keep rooms and walkways  well-lit. General instructions Talk with your health care provider about your risks for falling. Tell your health care provider if: You fall. Be sure to tell your health care provider about all falls, even ones that seem minor. You feel dizzy, tiredness (fatigue), or off-balance. Take over-the-counter and prescription medicines only as told by your health care provider. These include  supplements. Eat a healthy diet and maintain a healthy weight. A healthy diet includes low-fat dairy products, low-fat (lean) meats, and fiber from whole grains, beans, and lots of fruits and vegetables. Stay current with your vaccines. Schedule regular health, dental, and eye exams. Summary Having a healthy lifestyle and getting preventive care can help to protect your health and wellness after age 11. Screening and testing are the best way to find a health problem early and help you avoid having a fall. Early diagnosis and treatment give you the best chance for managing medical conditions that are more common for people who are older than age 28. Falls are a major cause of broken bones and head injuries in people who are older than age 48. Take precautions to prevent a fall at home. Work with your health care provider to learn what changes you can make to improve your health and wellness and to prevent falls. This information is not intended to replace advice given to you by your health care provider. Make sure you discuss any questions you have with your health care provider. Document Revised: 08/13/2020 Document Reviewed: 08/13/2020 Elsevier Patient Education  2024 [employer].

## 2023-08-18 NOTE — Assessment & Plan Note (Signed)
 Tolerating Fosamax  70 mg weekly well. Side effects much improved.  No longer getting muscle cramping

## 2023-08-18 NOTE — Progress Notes (Signed)
 Janet Leblanc 73 y.o.   Chief Complaint  Patient presents with   Follow-up    6 month f/u for HTN. Patient wanting to talk about weight loss injections. Patient also wanting to get a referral for a surgeon for her loose skin on her arms. Patient wants to inform that she getting a knot removed from gums on 08/24/23    HISTORY OF PRESENT ILLNESS: This is a 73 y.o. female here for 62-month follow-up of chronic medical conditions Interested in trying GLP-1 agonist Also requesting referral to cosmetic surgeon for loose skin on her upper arms  HPI   Prior to Admission medications   Medication Sig Start Date End Date Taking? Authorizing Provider  albuterol  (VENTOLIN  HFA) 108 (90 Base) MCG/ACT inhaler Inhale 2 puffs into the lungs every 6 (six) hours as needed for wheezing or shortness of breath. 06/28/22  Yes Allwardt, Alyssa M, PA-C  aspirin 81 MG tablet Take 81 mg by mouth daily.   Yes [provider]  Cholecalciferol (VITAMIN D3) 50 MCG (2000 UT) capsule Take 1 capsule (2,000 Units total) by mouth daily. 02/28/20  Yes Just, Kelsea J, FNP  levothyroxine  (SYNTHROID ) 50 MCG tablet TAKE 1 TABLET BY MOUTH EVERY  OTHER DAY 01/29/23  Yes Maleta Pacha, Isidro Margo, MD  losartan  (COZAAR ) 50 MG tablet TAKE 1 TABLET BY MOUTH DAILY 02/14/23  Yes Eurydice Calixto, Isidro Margo, MD  Magnesium 500 MG CAPS Take 500 mg by mouth daily.   Yes [provider]  Multiple Vitamin (MULTI VITAMIN DAILY PO) Take 1 tablet by mouth.   Yes [provider]  omeprazole  (PRILOSEC) 20 MG capsule Take one capsule by mouth every other day. 11/21/22  Yes Mandi Mattioli Jose, MD  POTASSIUM PO Take 1 tablet by mouth as needed.   Yes [provider]  rosuvastatin  (CRESTOR ) 10 MG tablet TAKE 1 TABLET BY MOUTH DAILY 04/17/23  Yes Amadou Katzenstein, Isidro Margo, MD  alendronate  (FOSAMAX ) 70 MG tablet Take 1 tablet (70 mg total) by mouth every 7 (seven) days. Take with a full glass of water on an empty stomach. Patient not  taking: Reported on 08/18/2023 12/11/22   Celest Reitz Jose, MD  diclofenac  (VOLTAREN ) 75 MG EC tablet Take 1 tablet (75 mg total) by mouth 2 (two) times daily. Patient not taking: Reported on 08/18/2023 07/02/22   Elvira Hammersmith, MD    Allergies  Allergen Reactions   Amoxicillin Rash   Penicillins Swelling    Patient Active Problem List   Diagnosis Date Noted   Age-related osteoporosis without current pathological fracture 02/25/2023   Chronic pain of left knee 07/02/2022   Stage 3a chronic kidney disease (HCC) 11/20/2021   Dyslipidemia 08/20/2021   Prediabetes 02/14/2021   Chronic venous insufficiency 02/14/2021   Hypothyroidism 02/14/2021   Class 2 severe obesity due to excess calories with serious comorbidity and body mass index (BMI) of 39.0 to 39.9 in adult [facility]) 02/14/2021   Type 2 diabetes mellitus without complication, without long-term current use of insulin (HCC) 02/28/2020   Other hyperlipidemia 02/28/2020   Vitamin D deficiency 02/28/2020   Essential hypertension 03/24/2016    Past Medical History:  Diagnosis Date   Allergy    seasonal   Arthritis    Hypertension    Thyroid  disease    Vertigo     Past Surgical History:  Procedure Laterality Date   CHOLECYSTECTOMY     COLONOSCOPY  06/20/2019   FOOT SURGERY     GASTRIC BYPASS  15 years ago at least   TUBAL LIGATION      Social History   Socioeconomic History   Marital status: Married    Spouse name: Therron   Number of children: 2   Years of education: 12   Highest education level: Not on file  Occupational History   Occupation: Multimedia programmer   Tobacco Use   Smoking status: Former    Current packs/day: 0.00    Types: Cigarettes    Start date: 1975    Quit date: 1990    Years since quitting: 35.3   Smokeless tobacco: Never  Vaping Use   Vaping status: Never Used  Substance and Sexual Activity   Alcohol use: No    Alcohol/week: 0.0 standard drinks of alcohol   Drug use: No    Sexual activity: Not on file  Other Topics Concern   Not on file  Social History Narrative   Not on file   Social Drivers of Health   Financial Resource Strain: Low Risk  (09/09/2022)   Overall Financial Resource Strain (CARDIA)    Difficulty of Paying Living Expenses: Not hard at all  Food Insecurity: No Food Insecurity (09/09/2022)   Hunger Vital Sign    Worried About Running Out of Food in the Last Year: Never true    Ran Out of Food in the Last Year: Never true  Transportation Needs: No Transportation Needs (09/09/2022)   PRAPARE - Administrator, Civil Service (Medical): No    Lack of Transportation (Non-Medical): No  Physical Activity: Inactive (09/09/2022)   Exercise Vital Sign    Days of Exercise per Week: 0 days    Minutes of Exercise per Session: 0 min  Stress: No Stress Concern Present (09/09/2022)   [employer] of Occupational Health - Occupational Stress Questionnaire    Feeling of Stress : Not at all  Social Connections: Socially Integrated (09/09/2022)   Social Connection and Isolation Panel [NHANES]    Frequency of Communication with Friends and Family: More than three times a week    Frequency of Social Gatherings with Friends and Family: More than three times a week    Attends Religious Services: More than 4 times per year    Active Member of [employer] or Organizations: Yes    Attends Engineer, structural: More than 4 times per year    Marital Status: Married  Catering manager Violence: Not At Risk (09/09/2022)   Humiliation, Afraid, Rape, and Kick questionnaire    Fear of Current or Ex-Partner: No    Emotionally Abused: No    Physically Abused: No    Sexually Abused: No    Family History  Problem Relation Age of Onset   Heart disease Father    Hypertension Father    Atrial fibrillation Sister    Heart disease Brother    Stroke Brother    Throat cancer Brother    Stroke Brother    Heart disease Paternal Grandmother    Cancer Paternal  Grandfather    Migraines Neg Hx    Colon polyps Neg Hx    Colon cancer Neg Hx    Esophageal cancer Neg Hx    Rectal cancer Neg Hx    Stomach cancer Neg Hx      Review of Systems  Constitutional: Negative.  Negative for chills and fever.  HENT: Negative.  Negative for congestion and sore throat.   Respiratory: Negative.  Negative for cough and shortness of breath.   Cardiovascular:  Negative.  Negative for chest pain and palpitations.  Gastrointestinal:  Negative for abdominal pain, diarrhea, nausea and vomiting.  Genitourinary: Negative.  Negative for dysuria and hematuria.  Skin: Negative.  Negative for rash.  Neurological: Negative.  Negative for dizziness and headaches.  All other systems reviewed and are negative.   Vitals:   08/18/23 1311  BP: 124/78  Pulse: 63  Temp: 98.4 F (36.9 C)  SpO2: 98%    Physical Exam Constitutional:      Appearance: Normal appearance. She is obese.  HENT:     Head: Normocephalic.     Mouth/Throat:     Mouth: Mucous membranes are moist.     Pharynx: Oropharynx is clear.  Eyes:     Extraocular Movements: Extraocular movements intact.     Conjunctiva/sclera: Conjunctivae normal.  Cardiovascular:     Rate and Rhythm: Normal rate and regular rhythm.     Pulses: Normal pulses.     Heart sounds: Normal heart sounds.  Pulmonary:     Effort: Pulmonary effort is normal.     Breath sounds: Normal breath sounds.  Musculoskeletal:     Cervical back: No tenderness.  Lymphadenopathy:     Cervical: No cervical adenopathy.  Skin:    General: Skin is warm and dry.     Capillary Refill: Capillary refill takes less than 2 seconds.  Neurological:     Mental Status: She is alert and oriented to person, place, and time.  Psychiatric:        Mood and Affect: Mood normal.        Behavior: Behavior normal.     ASSESSMENT & PLAN: A total of 42 minutes was spent with the patient and counseling/coordination of care regarding preparing for this  visit, review of most recent office visit notes, review of multiple chronic medical conditions and their management, review of all medications, review of most recent bloodwork results, review of health maintenance items, education on nutrition, prognosis, documentation, and need for follow up.   Problem List Items Addressed This Visit       Cardiovascular and Mediastinum   Essential hypertension   Well-controlled hypertension Continue losartan  50 mg daily Cardiovascular risks associated with hypertension discussed Diet approaches to stop hypertension discussed      Chronic venous insufficiency   Stable without worsening edema or infection         Endocrine   Hypothyroidism   Chronic stable condition Continue levothyroxine  50 mcg daily        Musculoskeletal and Integument   Age-related osteoporosis without current pathological fracture   Tolerating Fosamax  70 mg weekly well. Side effects much improved.  No longer getting muscle cramping        Genitourinary   Stage 3a chronic kidney disease (HCC)   Chronic stable condition GFR improved to 59 Advised to stay well-hydrated and avoid NSAIDs        Other   Class 2 severe obesity due to excess calories with serious comorbidity and body mass index (BMI) of 39.0 to 39.9 in adult [facility]) - Primary   Diet and nutrition discussed Advised to decrease amount of daily carbohydrate intake and daily calories and increase amount of plant-based protein in her diet Will benefit from GLP-1 agonists Willing to try it.  Recommend Zepbound weekly.  Choice of GLP-1 agonist depends on insurance coverage. New prescription sent to pharmacy record today.      Relevant Medications   tirzepatide (ZEPBOUND) 2.5 MG/0.5ML injection vial   Loose skin  Relevant Orders   Ambulatory referral to Pediatric Plastic Surgery   Patient Instructions  Health Maintenance After Age 23 After age 85, you are at a higher risk for certain long-term diseases  and infections as well as injuries from falls. Falls are a major cause of broken bones and head injuries in people who are older than age 72. Getting regular preventive care can help to keep you healthy and well. Preventive care includes getting regular testing and making lifestyle changes as recommended by your health care provider. Talk with your health care provider about: Which screenings and tests you should have. A screening is a test that checks for a disease when you have no symptoms. A diet and exercise plan that is right for you. What should I know about screenings and tests to prevent falls? Screening and testing are the best ways to find a health problem early. Early diagnosis and treatment give you the best chance of managing medical conditions that are common after age 77. Certain conditions and lifestyle choices may make you more likely to have a fall. Your health care provider may recommend: Regular vision checks. Poor vision and conditions such as cataracts can make you more likely to have a fall. If you wear glasses, make sure to get your prescription updated if your vision changes. Medicine review. Work with your health care provider to regularly review all of the medicines you are taking, including over-the-counter medicines. Ask your health care provider about any side effects that may make you more likely to have a fall. Tell your health care provider if any medicines that you take make you feel dizzy or sleepy. Strength and balance checks. Your health care provider may recommend certain tests to check your strength and balance while standing, walking, or changing positions. Foot health exam. Foot pain and numbness, as well as not wearing proper footwear, can make you more likely to have a fall. Screenings, including: Osteoporosis screening. Osteoporosis is a condition that causes the bones to get weaker and break more easily. Blood pressure screening. Blood pressure changes and  medicines to control blood pressure can make you feel dizzy. Depression screening. You may be more likely to have a fall if you have a fear of falling, feel depressed, or feel unable to do activities that you used to do. Alcohol use screening. Using too much alcohol can affect your balance and may make you more likely to have a fall. Follow these instructions at home: Lifestyle Do not drink alcohol if: Your health care provider tells you not to drink. If you drink alcohol: Limit how much you have to: 0-1 drink a day for women. 0-2 drinks a day for men. Know how much alcohol is in your drink. In the [place] one drink equals one 12 oz bottle of beer (355 mL), one 5 oz glass of wine (148 mL), or one 1 oz glass of hard liquor (44 mL). Do not use any products that contain nicotine or tobacco. These products include cigarettes, chewing tobacco, and vaping devices, such as e-cigarettes. If you need help quitting, ask your health care provider. Activity  Follow a regular exercise program to stay fit. This will help you maintain your balance. Ask your health care provider what types of exercise are appropriate for you. If you need a cane or walker, use it as recommended by your health care provider. Wear supportive shoes that have nonskid soles. Safety  Remove any tripping hazards, such as rugs, cords, and clutter. Install safety  equipment such as grab bars in bathrooms and safety rails on stairs. Keep rooms and walkways well-lit. General instructions Talk with your health care provider about your risks for falling. Tell your health care provider if: You fall. Be sure to tell your health care provider about all falls, even ones that seem minor. You feel dizzy, tiredness (fatigue), or off-balance. Take over-the-counter and prescription medicines only as told by your health care provider. These include supplements. Eat a healthy diet and maintain a healthy weight. A healthy diet includes low-fat  dairy products, low-fat (lean) meats, and fiber from whole grains, beans, and lots of fruits and vegetables. Stay current with your vaccines. Schedule regular health, dental, and eye exams. Summary Having a healthy lifestyle and getting preventive care can help to protect your health and wellness after age 3. Screening and testing are the best way to find a health problem early and help you avoid having a fall. Early diagnosis and treatment give you the best chance for managing medical conditions that are more common for people who are older than age 38. Falls are a major cause of broken bones and head injuries in people who are older than age 33. Take precautions to prevent a fall at home. Work with your health care provider to learn what changes you can make to improve your health and wellness and to prevent falls. This information is not intended to replace advice given to you by your health care provider. Make sure you discuss any questions you have with your health care provider. Document Revised: 08/13/2020 Document Reviewed: 08/13/2020 Elsevier Patient Education  2024 Elsevier Inc.    Maryagnes Small, MD [facility]

## 2023-08-18 NOTE — Assessment & Plan Note (Signed)
 Chronic stable condition Continue levothyroxine  50 mcg daily

## 2023-08-18 NOTE — Assessment & Plan Note (Signed)
 Chronic stable condition GFR improved to 59 Advised to stay well-hydrated and avoid NSAIDs

## 2023-08-18 NOTE — Assessment & Plan Note (Signed)
Well-controlled hypertension Continue losartan 50 mg daily Cardiovascular risks associated with hypertension discussed Diet approaches to stop hypertension discussed

## 2023-08-18 NOTE — Assessment & Plan Note (Signed)
 Diet and nutrition discussed Advised to decrease amount of daily carbohydrate intake and daily calories and increase amount of plant-based protein in her diet Will benefit from GLP-1 agonists Willing to try it.  Recommend Zepbound weekly.  Choice of GLP-1 agonist depends on insurance coverage. New prescription sent to pharmacy record today.

## 2023-08-18 NOTE — Assessment & Plan Note (Signed)
Stable without worsening edema or infection

## 2023-08-19 ENCOUNTER — Telehealth: Payer: Self-pay | Admitting: Emergency Medicine

## 2023-08-19 NOTE — Telephone Encounter (Unsigned)
 Copied from CRM [id number]. Topic: Clinical - Medical Advice >> Aug 19, 2023 11:50 AM Lajean Pike wrote: Reason for CRM: Patient called in stating to be returning the phone call for one of the nurses to speak with per a conversation had yesterday with someone from the office and she is just returning the call. Reached CAL and was advised that nurses are going to lunch. Let patient know and advised that a nurse will reach her afterwards. Patient contact: [phone].

## 2023-08-20 NOTE — Telephone Encounter (Signed)
 Returned patients call. LVM for her to call back

## 2023-08-20 NOTE — Telephone Encounter (Signed)
 Returning patients phone call LVM for her to call back

## 2023-08-20 NOTE — Telephone Encounter (Signed)
 Copied from CRM [id number]. Topic: Clinical - Medical Advice >> Aug 20, 2023 12:10 PM Chuck Crater wrote: Patient is returning a call from the nurse. She is requesting a callback.

## 2023-08-21 ENCOUNTER — Other Ambulatory Visit (HOSPITAL_COMMUNITY): Payer: Self-pay

## 2023-08-21 ENCOUNTER — Telehealth: Payer: Self-pay

## 2023-08-21 NOTE — Telephone Encounter (Signed)
 Pharmacy Patient Advocate Encounter   Received notification from Pt Calls Messages that prior authorization for Zepbound 2.5mg /0.37ml is required/requested.   Insurance verification completed.   The patient is insured through [facility] .   Per test claim: PA required; PA submitted to above mentioned insurance via CoverMyMeds Key/confirmation #/EOC [id number] Status is pending

## 2023-08-24 DIAGNOSIS — K136 Irritative hyperplasia of oral mucosa: Secondary | ICD-10-CM | POA: Diagnosis not present

## 2023-08-25 ENCOUNTER — Ambulatory Visit: Payer: Medicare Other | Admitting: Emergency Medicine

## 2023-08-25 NOTE — Telephone Encounter (Signed)
 Pharmacy Patient Advocate Encounter  Received notification from [employer] that Prior Authorization for Zepbound 2.5mg /0.49ml has been DENIED.  See denial reason below. No denial letter attached in CMM. Will attach denial letter to Media tab once received.   PA #/Case ID/Reference #: [id number]

## 2023-09-01 NOTE — Telephone Encounter (Signed)
 Copied from CRM [id number]. Topic: Referral - Question >> Sep 01, 2023 11:55 AM Luane Rumps D wrote: Reason for CRM: Patient would like to speak with CMA regarding Prior Auth (Zepbound 2.5mg /0.80ml) denial. Patient stated she spoke with her insurance and got more information so she would like to have the CMA give her a call back.

## 2023-09-01 NOTE — Telephone Encounter (Signed)
 Spoke with Patient she is needing more clinical notes to be sent to [employer] for approval

## 2023-09-08 NOTE — Telephone Encounter (Signed)
 Copied from CRM [id number]. Topic: Clinical - Prescription Issue >> Sep 08, 2023  1:28 PM Star East wrote: Reason for CRM: tirzepatide (ZEPBOUND) 2.5 MG/0.5ML injection vial- pharmacy sending a form by fax to be filled out, signed and sent back, would like to know when it is received and sent back - [phone]

## 2023-09-11 NOTE — Telephone Encounter (Signed)
 No forms received

## 2023-09-15 ENCOUNTER — Ambulatory Visit (INDEPENDENT_AMBULATORY_CARE_PROVIDER_SITE_OTHER): Payer: Medicare Other

## 2023-09-15 VITALS — Ht 67.0 in | Wt 239.0 lb

## 2023-09-15 DIAGNOSIS — Z0000 Encounter for general adult medical examination without abnormal findings: Secondary | ICD-10-CM | POA: Diagnosis not present

## 2023-09-15 DIAGNOSIS — E119 Type 2 diabetes mellitus without complications: Secondary | ICD-10-CM | POA: Diagnosis not present

## 2023-09-15 NOTE — Progress Notes (Signed)
 Subjective:   Janet Leblanc is a 73 y.o. who presents for a Medicare Wellness preventive visit.  As a reminder, Annual Wellness Visits don't include a physical exam, and some assessments may be limited, especially if this visit is performed virtually. We may recommend an in-person follow-up visit with your provider if needed.  Visit Complete: Virtual I connected with  Janet Leblanc on 09/15/23 by a audio enabled telemedicine application and verified that I am speaking with the correct person using two identifiers.  Patient Location: Home  Provider Location: Office/Clinic  I discussed the limitations of evaluation and management by telemedicine. The patient expressed understanding and agreed to proceed.  Vital Signs: Because this visit was a virtual/telehealth visit, some criteria may be missing or patient reported. Any vitals not documented were not able to be obtained and vitals that have been documented are patient reported.  VideoDeclined- This patient declined Librarian, academic. Therefore the visit was completed with audio only.  Persons Participating in Visit: Patient.  AWV Questionnaire: Yes: Patient Medicare AWV questionnaire was completed by the patient on 09/08/2023; I have confirmed that all information answered by patient is correct and no changes since this date.  Cardiac Risk Factors include: advanced age (>78men, >19 women);diabetes mellitus;dyslipidemia;hypertension;obesity (BMI >30kg/m2)     Objective:     Today's Vitals   09/15/23 1051  Weight: 239 lb (108.4 kg)  Height: 5\' 7"  (1.702 m)   Body mass index is 37.43 kg/m.     09/15/2023   10:49 AM 09/09/2022    1:41 PM 08/20/2021    4:23 PM 05/10/2019    2:10 PM  Advanced Directives  Does Patient Have a Medical Advance Directive? Yes Yes Yes Yes  Type of Estate agent of [place];Living will Healthcare Power of [place];Living will Living will;Healthcare Power of  [employer] Power of Attorney  Does patient want to make changes to medical advance directive?   No - Patient declined No - Patient declined  Copy of Healthcare Power of Attorney in Chart? No - copy requested No - copy requested No - copy requested No - copy requested    Current Medications (verified) Outpatient Encounter Medications as of 09/15/2023  Medication Sig   albuterol  (VENTOLIN  HFA) 108 (90 Base) MCG/ACT inhaler Inhale 2 puffs into the lungs every 6 (six) hours as needed for wheezing or shortness of breath.   alendronate  (FOSAMAX ) 70 MG tablet Take 1 tablet (70 mg total) by mouth every 7 (seven) days. Take with a full glass of water on an empty stomach.   aspirin 81 MG tablet Take 81 mg by mouth daily.   Cholecalciferol (VITAMIN D3) 50 MCG (2000 UT) capsule Take 1 capsule (2,000 Units total) by mouth daily.   diclofenac  (VOLTAREN ) 75 MG EC tablet Take 1 tablet (75 mg total) by mouth 2 (two) times daily.   levothyroxine  (SYNTHROID ) 50 MCG tablet TAKE 1 TABLET BY MOUTH EVERY  OTHER DAY   losartan  (COZAAR ) 50 MG tablet TAKE 1 TABLET BY MOUTH DAILY   Magnesium 500 MG CAPS Take 500 mg by mouth daily.   Multiple Vitamin (MULTI VITAMIN DAILY PO) Take 1 tablet by mouth.   omeprazole  (PRILOSEC) 20 MG capsule Take one capsule by mouth every other day.   POTASSIUM PO Take 1 tablet by mouth as needed.   rosuvastatin  (CRESTOR ) 10 MG tablet TAKE 1 TABLET BY MOUTH DAILY   tirzepatide (ZEPBOUND) 2.5 MG/0.5ML injection vial Inject 2.5 mg into the skin  once a week.   No facility-administered encounter medications on file as of 09/15/2023.    Allergies (verified) Amoxicillin and Penicillins   History: Past Medical History:  Diagnosis Date   Allergy    seasonal   Arthritis    Hypertension    Thyroid  disease    Vertigo    Past Surgical History:  Procedure Laterality Date   CHOLECYSTECTOMY     COLONOSCOPY  06/20/2019   FOOT SURGERY     GASTRIC BYPASS     15 years ago at least    TUBAL LIGATION     Family History  Problem Relation Age of Onset   Heart disease Father    Hypertension Father    Atrial fibrillation Sister    Heart disease Brother    Stroke Brother    Throat cancer Brother    Stroke Brother    Heart disease Paternal Grandmother    Cancer Paternal Grandfather    Migraines Neg Hx    Colon polyps Neg Hx    Colon cancer Neg Hx    Esophageal cancer Neg Hx    Rectal cancer Neg Hx    Stomach cancer Neg Hx    Social History   Socioeconomic History   Marital status: Married    Spouse name: Therron   Number of children: 2   Years of education: 12   Highest education level: 12th grade  Occupational History   Occupation: Multimedia programmer   Tobacco Use   Smoking status: Former    Current packs/day: 0.00    Types: Cigarettes    Start date: 1975    Quit date: 1990    Years since quitting: 35.4   Smokeless tobacco: Never  Vaping Use   Vaping status: Never Used  Substance and Sexual Activity   Alcohol use: No    Alcohol/week: 0.0 standard drinks of alcohol   Drug use: No   Sexual activity: Not Currently    Birth control/protection: Post-menopausal  Other Topics Concern   Not on file  Social History Narrative   Not on file   Social Drivers of Health   Financial Resource Strain: Low Risk  (09/15/2023)   Overall Financial Resource Strain (CARDIA)    Difficulty of Paying Living Expenses: Not hard at all  Food Insecurity: No Food Insecurity (09/15/2023)   Hunger Vital Sign    Worried About Running Out of Food in the Last Year: Never true    Ran Out of Food in the Last Year: Never true  Transportation Needs: No Transportation Needs (09/15/2023)   PRAPARE - Administrator, Civil Service (Medical): No    Lack of Transportation (Non-Medical): No  Physical Activity: Sufficiently Active (09/15/2023)   Exercise Vital Sign    Days of Exercise per Week: 5 days    Minutes of Exercise per Session: 60 min  Recent Concern: Physical Activity -  Inactive (09/08/2023)   Exercise Vital Sign    Days of Exercise per Week: 1 day    Minutes of Exercise per Session: 0 min  Stress: No Stress Concern Present (09/15/2023)   [employer] of Occupational Health - Occupational Stress Questionnaire    Feeling of Stress : Only a little  Social Connections: Moderately Isolated (09/15/2023)   Social Connection and Isolation Panel [NHANES]    Frequency of Communication with Friends and Family: More than three times a week    Frequency of Social Gatherings with Friends and Family: Once a week    Attends Religious  Services: Never    Database administrator or Organizations: No    Attends Engineer, structural: Not on file    Marital Status: Married    Tobacco Counseling Counseling given: No    Clinical Intake:  Pre-visit preparation completed: Yes  Pain : No/denies pain     BMI - recorded: 37.43 Nutritional Status: BMI > 30  Obese Nutritional Risks: None Diabetes: Yes CBG done?: No Did pt. bring in CBG monitor from home?: No  Lab Results  Component Value Date   HGBA1C 6.0 (A) 02/25/2023   HGBA1C 6.5 08/25/2022   HGBA1C 5.9 (A) 02/24/2022     How often do you need to have someone help you when you read instructions, pamphlets, or other written materials from your doctor or pharmacy?: 1 - Never  Interpreter Needed?: No  Information entered by :: Kandy Orris, CMA   Activities of Daily Living     09/15/2023   11:08 AM 09/08/2023   12:39 PM  In your present state of health, do you have any difficulty performing the following activities:  Hearing? 0 0  Vision? 0 0  Difficulty concentrating or making decisions? 0 0  Walking or climbing stairs? 0 0  Dressing or bathing? 0 0  Doing errands, shopping? 0 0  Preparing Food and eating ? N N  Using the Toilet? N N  In the past six months, have you accidently leaked urine? N N  Do you have problems with loss of bowel control? N N  Managing your Medications? N N   Managing your Finances? N N  Housekeeping or managing your Housekeeping? N N    Patient Care Team: Elvira Hammersmith, MD as PCP - General (Internal Medicine) Lenton Rail, MD as Consulting Physician (Otolaryngology) Ronn Cohn, MD as Consulting Physician (Orthopedic Surgery) [facility] [place]  I have updated your Care Teams any recent Medical Services you may have received from other providers in the past year.     Assessment:    This is a routine wellness examination for Janet Leblanc.  Hearing/Vision screen Hearing Screening - Comments:: Denies hearing difficulties   Vision Screening - Comments:: Wears rx glasses -   up-to-date w/[facility]   Goals Addressed               This Visit's Progress     No current goals (pt-stated)        Patient stated that she's cut out red meat and dairy products - continue watching diet       Depression Screen     09/15/2023   10:54 AM 08/18/2023    1:19 PM 02/25/2023    1:06 PM 09/10/2022    3:05 PM 09/09/2022    1:39 PM 08/25/2022    1:06 PM 07/02/2022    3:01 PM  PHQ 2/9 Scores  PHQ - 2 Score 0 0 0 0 0 0 0  PHQ- 9 Score 0          Fall Risk     09/15/2023   10:53 AM 09/08/2023   12:39 PM 08/18/2023    1:19 PM 02/25/2023    1:06 PM 09/10/2022    3:05 PM  Fall Risk   Falls in the past year? 0 1 0 0 0  Comment No falls - confirmed w/pt      Number falls in past yr: 0 0 0 0 0  Injury with Fall? 0 0 0 0 0  Risk for fall due to :  No Fall Risks  No Fall Risks No Fall Risks No Fall Risks  Follow up Falls evaluation completed;Falls prevention discussed  Falls evaluation completed Falls evaluation completed Falls evaluation completed    MEDICARE RISK AT HOME:  Medicare Risk at Home Any stairs in or around the home?: Yes If so, are there any without handrails?: No Home free of loose throw rugs in walkways, pet beds, electrical cords, etc?: Yes Adequate lighting in your home to reduce risk of falls?: Yes Life alert?:  No Use of a cane, walker or w/c?: No Grab bars in the bathroom?: No Shower chair or bench in shower?: Yes Elevated toilet seat or a handicapped toilet?: Yes  TIMED UP AND GO:  Was the test performed?  No  Cognitive Function: 6CIT completed        09/15/2023   10:56 AM 09/09/2022    1:41 PM 08/20/2021    4:25 PM 05/10/2019    2:10 PM  6CIT Screen  What Year? 0 points 0 points 0 points 0 points  What month? 0 points 0 points 0 points 0 points  What time? 0 points 0 points 0 points 0 points  Count back from 20 0 points 0 points 0 points 0 points  Months in reverse 0 points 0 points 0 points 0 points  Repeat phrase 0 points 4 points 0 points 0 points  Total Score 0 points 4 points 0 points 0 points    Immunizations Immunization History  Administered Date(s) Administered   Influenza, High Dose Seasonal PF 01/09/2019   Influenza, Quadrivalent, Recombinant, Inj, Pf 01/20/2018   Influenza,inj,Quad PF,6+ Mos 02/05/2017   Influenza-Unspecified 02/23/2016, 12/28/2019, 12/12/2022   PFIZER(Purple Top)SARS-COV-2 Vaccination 06/16/2019, 07/08/2019, 02/06/2020   Unspecified SARS-COV-2 Vaccination 12/12/2022    Screening Tests Health Maintenance  Topic Date Due   FOOT EXAM  06/19/2021   COVID-19 Vaccine (5 - 2024-25 season) 06/11/2023   Diabetic kidney evaluation - eGFR measurement  08/25/2023   Diabetic kidney evaluation - Urine ACR  08/25/2023   HEMOGLOBIN A1C  08/25/2023   Pneumonia Vaccine 56+ Years old (1 of 2 - PCV) 10/18/2023 (Originally 08/16/1969)   Zoster Vaccines- Shingrix (1 of 2) 11/18/2023 (Originally 08/15/2001)   OPHTHALMOLOGY EXAM  10/01/2023   INFLUENZA VACCINE  11/06/2023   Medicare Annual Wellness (AWV)  09/14/2024   MAMMOGRAM  12/08/2024   Colonoscopy  06/19/2029   DEXA SCAN  Completed   Hepatitis C Screening  Completed   HPV VACCINES  Aged Out   Meningococcal B Vaccine  Aged Out   DTaP/Tdap/Td  Discontinued    Health Maintenance  Health Maintenance Due   Topic Date Due   FOOT EXAM  06/19/2021   COVID-19 Vaccine (5 - 2024-25 season) 06/11/2023   Diabetic kidney evaluation - eGFR measurement  08/25/2023   Diabetic kidney evaluation - Urine ACR  08/25/2023   HEMOGLOBIN A1C  08/25/2023   Health Maintenance Items Addressed:  Labs Ordered: Diabetic kidney -eGFR and Urine ACR  Additional Screening:  Vision Screening: Recommended annual ophthalmology exams for early detection of glaucoma and other disorders of the eye. Pt stated she's had an annual eye exam w/[facility] in 2025.   Dental Screening: Recommended annual dental exams for proper oral hygiene  Community Resource Referral / Chronic Care Management: CRR required this visit?  No   CCM required this visit?  No   Plan:    I have personally reviewed and noted the following in the patient's chart:   Medical and  social history Use of alcohol, tobacco or illicit drugs  Current medications and supplements including opioid prescriptions. Patient is not currently taking opioid prescriptions. Functional ability and status Nutritional status Physical activity Advanced directives List of other physicians Hospitalizations, surgeries, and ER visits in previous 12 months Vitals Screenings to include cognitive, depression, and falls Referrals and appointments  In addition, I have reviewed and discussed with patient certain preventive protocols, quality metrics, and best practice recommendations. A written personalized care plan for preventive services as well as general preventive health recommendations were provided to patient.   Patria Bookbinder, CMA   09/15/2023   After Visit Summary: (MyChart) Due to this being a telephonic visit, the after visit summary with patients personalized plan was offered to patient via MyChart   Notes: Nothing significant to report at this time.

## 2023-09-15 NOTE — Patient Instructions (Addendum)
 Ms. Nebergall , Thank you for taking time out of your busy schedule to complete your Annual Wellness Visit with me. I enjoyed our conversation and look forward to speaking with you again next year. I, as well as your care team,  appreciate your ongoing commitment to your health goals. Please review the following plan we discussed and let me know if I can assist you in the future. Your Game plan/ To Do List    Referrals: If you haven't heard from the office you've been referred to, please reach out to them at the phone provided.  Ordered labs - Diabetic Kidney tests Follow up Visits: Next Medicare AWV with our clinical staff: 09/20/2024   Have you seen your provider in the last 6 months (3 months if uncontrolled diabetes)? Yes Next Office Visit with your provider: 02/23/2024  Clinician Recommendations:  Aim for 30 minutes of exercise or brisk walking, 6-8 glasses of water, and 5 servings of fruits and vegetables each day. Educated and advised on getting the Pneumonia and Shingles vaccines in 2025.      This is a list of the screening recommended for you and due dates:  Health Maintenance  Topic Date Due   Complete foot exam   06/19/2021   COVID-19 Vaccine (5 - 2024-25 season) 06/11/2023   Yearly kidney function blood test for diabetes  08/25/2023   Yearly kidney health urinalysis for diabetes  08/25/2023   Hemoglobin A1C  08/25/2023   Pneumonia Vaccine (1 of 2 - PCV) 10/18/2023*   Zoster (Shingles) Vaccine (1 of 2) 11/18/2023*   Eye exam for diabetics  10/01/2023   Flu Shot  11/06/2023   Medicare Annual Wellness Visit  09/14/2024   Mammogram  12/08/2024   Colon Cancer Screening  06/19/2029   DEXA scan (bone density measurement)  Completed   Hepatitis C Screening  Completed   HPV Vaccine  Aged Out   Meningitis B Vaccine  Aged Out   DTaP/Tdap/Td vaccine  Discontinued  *Topic was postponed. The date shown is not the original due date.    Advanced directives: (Copy Requested) Please bring a  copy of your health care power of attorney and living will to the office to be added to your chart at your convenience. You can mail to [facility] [address] [place], [place] [address] or email to [email or web address] Advance Care Planning is important because it:  [x]  Makes sure you receive the medical care that is consistent with your values, goals, and preferences  [x]  It provides guidance to your family and loved ones and reduces their decisional burden about whether or not they are making the right decisions based on your wishes.  Follow the link provided in your after visit summary or read over the paperwork we have mailed to you to help you started getting your Advance Directives in place. If you need assistance in completing these, please reach out to us  so that we can help you!

## 2023-10-23 ENCOUNTER — Other Ambulatory Visit: Payer: Self-pay | Admitting: Emergency Medicine

## 2023-10-23 DIAGNOSIS — I10 Essential (primary) hypertension: Secondary | ICD-10-CM

## 2023-10-23 MED ORDER — OMEPRAZOLE 20 MG PO CPDR: DELAYED_RELEASE_CAPSULE | ORAL | 1 refills | Status: AC

## 2023-10-23 NOTE — Telephone Encounter (Signed)
 Copied from CRM [id number]. Topic: Clinical - Medication Refill >> Oct 23, 2023  4:15 PM [place] J wrote: Medication: omeprazole  (PRILOSEC) 20 MG capsule  Has the patient contacted their pharmacy? Yes (Agent: If no, request that the patient contact the pharmacy for the refill. If patient does not wish to contact the pharmacy document the reason why and proceed with request.) (Agent: If yes, when and what did the pharmacy advise?) Pharmacy does not have refills.  This is the patient's preferred pharmacy:  [facility] - [place], [place] - [address] [place] [address] Phone: [phone] Fax: [phone]  Is this the correct pharmacy for this prescription? Yes If no, delete pharmacy and type the correct one.   Has the prescription been filled recently? No  Is the patient out of the medication? Yes  Has the patient been seen for an appointment in the last year OR does the patient have an upcoming appointment? Yes  Can we respond through MyChart? Yes  Agent: Please be advised that Rx refills may take up to 3 business days. We ask that you follow-up with your pharmacy.

## 2023-11-12 ENCOUNTER — Other Ambulatory Visit: Payer: Self-pay | Admitting: Emergency Medicine

## 2023-11-12 DIAGNOSIS — M810 Age-related osteoporosis without current pathological fracture: Secondary | ICD-10-CM

## 2023-11-30 NOTE — Progress Notes (Unsigned)
 I, Claretha Schimke am a scribe for Dr. Artist Lloyd, MD.  Janet Leblanc is a 73 y.o. female who presents to [facility] today for exacerbation of her left knee. Pt was last seen by Dr. Lloyd on 08/12/22. Last L knee Baker's cyst aspiration and injection, 07/12/22.  Today, pt reports that she has a knot in th back of the knee. If she straightens out her leg it is painful. She can also tell when she is walking. It feels like needles.   Left wrist is hurting for about several months. Did not have an injury. Pain is located on the left side of wrist on left hand. Currently wearing a brace to help support it. Uses her hands a lot. Rom is limited. Tried icy hot on the wrist and taking tylenol  arthritis.   Dx imaging: 07/02/2022 L knee XR  Pertinent review of systems: No fevers or chills  Relevant historical information: Hypertension and diabetes History of Baker's cyst left knee  Exam:  BP (!) 144/80   Pulse 84   Ht 5' 7 (1.702 m)   Wt 238 lb 3.2 oz (108 kg)   SpO2 94%   BMI 37.31 kg/m  General: Well Developed, well nourished, and in no acute distress.   MSK: Left knee: Large Baker's cyst posterior knee.  Left wrist: Swelling dorsal ulnar left wrist.  Tender palpation of this region some pain with resisted wrist extension.    Lab and Radiology Results  Procedure: Real-time Ultrasound Guided aspiration and injection of large loculated Baker's cyst left knee Device: Philips Affiniti 50G/GE Logiq Images permanently stored and available for review in PACS Verbal informed consent obtained.  Discussed risks and benefits of procedure. Warned about infection, bleeding, hyperglycemia damage to structures among others. Patient expresses understanding and agreement Time-out conducted.   Noted no overlying erythema, induration, or other signs of local infection.   Skin prepped in a sterile fashion.   Local anesthesia: Topical Ethyl chloride.   With sterile  technique and under real time ultrasound guidance: 3 mL of lidocaine injected into subcutaneous tissue posterior knee achieving good anesthesia.  Skin again sterilized with isopropyl alcohol. 18-gauge needle used to access the Baker's cyst. 15 mL of clear yellow fluid aspirated.  The cyst was partially decompressed however with needle repositioning I was unable to fully decompress the loculated Baker's cyst. Syringe was exchanged and 40 mg of Kenalog  and 2 L of Marcaine were injected into the now partially decompressed Baker's cyst. Completed without difficulty   Pain immediately resolved suggesting accurate placement of the medication.   Advised to call if fevers/chills, erythema, induration, drainage, or persistent bleeding.   Images permanently stored and available for review in the ultrasound unit.  Impression: Technically successful ultrasound guided injection.    Procedure: Real-time Ultrasound Guided Injection of left wrist ECU tendon sheath Device: Philips Affiniti 50G/GE Logiq Images permanently stored and available for review in PACS Verbal informed consent obtained.  Discussed risks and benefits of procedure. Warned about infection, bleeding, hyperglycemia damage to structures among others. Patient expresses understanding and agreement Time-out conducted.   Noted no overlying erythema, induration, or other signs of local infection.   Skin prepped in a sterile fashion.   Local anesthesia: Topical Ethyl chloride.   With sterile technique and under real time ultrasound guidance: 40 mg of Kenalog  and 1 ml of lidocaine injected into tendon sheath left ECU tendon. Fluid seen entering the tendon sheath.   Completed without difficulty  Pain immediately resolved suggesting accurate placement of the medication.   Advised to call if fevers/chills, erythema, induration, drainage, or persistent bleeding.   Images permanently stored and available for review in the ultrasound unit.   Impression: Technically successful ultrasound guided injection.        Assessment and Plan: 73 y.o. female with left knee pain due to Baker's cyst.  Plan for aspiration injection Baker's cyst.  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 Continue wrist brace.  PDMP not reviewed this encounter. Orders Placed This Encounter  Procedures   US  LIMITED JOINT SPACE STRUCTURES LOW LEFT(NO LINKED CHARGES)    Reason for Exam (SYMPTOM  OR DIAGNOSIS REQUIRED):   knee pain    Preferred imaging location?:   [facility] [place]   No orders of the defined types were placed in this encounter.    Discussed warning signs or symptoms. Please see discharge instructions. Patient expresses understanding.   The above documentation has been reviewed and is accurate and complete Artist Lloyd, M.D.

## 2023-12-01 ENCOUNTER — Other Ambulatory Visit: Payer: Self-pay

## 2023-12-01 ENCOUNTER — Ambulatory Visit: Admitting: Family Medicine

## 2023-12-01 VITALS — BP 144/80 | HR 84 | Ht 67.0 in | Wt 238.2 lb

## 2023-12-01 DIAGNOSIS — M778 Other enthesopathies, not elsewhere classified: Secondary | ICD-10-CM | POA: Diagnosis not present

## 2023-12-01 DIAGNOSIS — G8929 Other chronic pain: Secondary | ICD-10-CM | POA: Diagnosis not present

## 2023-12-01 DIAGNOSIS — M25562 Pain in left knee: Secondary | ICD-10-CM

## 2023-12-01 DIAGNOSIS — M7122 Synovial cyst of popliteal space [Baker], left knee: Secondary | ICD-10-CM

## 2023-12-01 DIAGNOSIS — H1011 Acute atopic conjunctivitis, right eye: Secondary | ICD-10-CM | POA: Diagnosis not present

## 2023-12-01 DIAGNOSIS — H02833 Dermatochalasis of right eye, unspecified eyelid: Secondary | ICD-10-CM | POA: Diagnosis not present

## 2023-12-01 DIAGNOSIS — H35373 Puckering of macula, bilateral: Secondary | ICD-10-CM | POA: Diagnosis not present

## 2023-12-01 DIAGNOSIS — H02836 Dermatochalasis of left eye, unspecified eyelid: Secondary | ICD-10-CM | POA: Diagnosis not present

## 2023-12-01 DIAGNOSIS — H26493 Other secondary cataract, bilateral: Secondary | ICD-10-CM | POA: Diagnosis not present

## 2023-12-01 LAB — HM DIABETES EYE EXAM

## 2023-12-01 NOTE — Patient Instructions (Signed)
 Thank you for coming in today.   Return as needed.   Call or go to the ER if you develop a large red swollen joint with extreme pain or oozing puss.    Take it easy with the wrist for a few weeks.

## 2023-12-02 ENCOUNTER — Ambulatory Visit: Admitting: Sports Medicine

## 2023-12-18 ENCOUNTER — Other Ambulatory Visit: Payer: Self-pay

## 2023-12-18 ENCOUNTER — Telehealth: Payer: Self-pay

## 2023-12-18 ENCOUNTER — Ambulatory Visit: Admitting: Family Medicine

## 2023-12-18 ENCOUNTER — Ambulatory Visit

## 2023-12-18 ENCOUNTER — Encounter: Payer: Self-pay | Admitting: Family Medicine

## 2023-12-18 VITALS — BP 126/60 | HR 76 | Ht 67.0 in | Wt 238.0 lb

## 2023-12-18 DIAGNOSIS — G8929 Other chronic pain: Secondary | ICD-10-CM

## 2023-12-18 DIAGNOSIS — M791 Myalgia, unspecified site: Secondary | ICD-10-CM

## 2023-12-18 DIAGNOSIS — M1712 Unilateral primary osteoarthritis, left knee: Secondary | ICD-10-CM | POA: Diagnosis not present

## 2023-12-18 DIAGNOSIS — M25562 Pain in left knee: Secondary | ICD-10-CM | POA: Diagnosis not present

## 2023-12-18 NOTE — Patient Instructions (Addendum)
 Thank you for coming in today.   Please get an Xray today before you leave   We will check insurance benefits for Zilretta  injection for your knee.

## 2023-12-18 NOTE — Progress Notes (Signed)
   I, Leotis Batter, CMA acting as a scribe for Artist Lloyd, MD.  Janet Leblanc is a 73 y.o. female who presents to [facility] at [facility] today for cont'd le ft knee pain and swelling. Pt was last seen by Dr. Lloyd on 12/01/23 and her L knee Baker's cyst aspiration and injected. Also injected her L wrist ECU tendon.  Today, pt reports continued pain and swelling at posterior and lateral aspects. Feels like pins and needles. Some pain at anterior lower leg. Also notes new myalgia in the LE since starting on Fosamax   Dx imaging: 07/02/2022 L knee XR   Pertinent review of systems: No fevers or chills  Relevant historical information: Hypertension diabetes osteoporosis.   Exam:  BP 126/60   Pulse 76   Ht 5' 7 (1.702 m)   Wt 238 lb (108 kg)   SpO2 98%   BMI 37.28 kg/m  General: Well Developed, well nourished, and in no acute distress.   MSK: Left knee moderate effusion normal motion antalgic gait    Lab and Radiology Results  X-ray images left knee obtained today personally and independently interpreted. Mild to moderate medial DJD.  Question osteochondral defect medial joint line. Await formal radiology review    Assessment and Plan: 73 y.o. 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  She has a possible osteochondral defect at the medial joint line which may change the plan.  Plan prior to discharge today was work on authorization for Zilretta  injection.  However we may proceed to MRI based on radiology over read before proceeding with any more injections.  Additionally she has chronic lower extremity pain predominantly at night since starting alendronate .  It is possible her symptoms are due to alendronate  and I recommended trying to stop the medication for about a month and see how she feels.  We could also try using gabapentin.  She like to wait on the gabapentin.   PDMP not reviewed this encounter. Orders Placed This Encounter  Procedures   DG  Knee AP/LAT W/Sunrise Left    Standing Status:   Future    Number of Occurrences:   1    Expiration Date:   01/17/2024    Reason for Exam (SYMPTOM  OR DIAGNOSIS REQUIRED):   left knee pain    Preferred imaging location?:   [facility]   No orders of the defined types were placed in this encounter.    Discussed warning signs or symptoms. Please see discharge instructions. Patient expresses understanding.   The above documentation has been reviewed and is accurate and complete Artist Lloyd, M.D.

## 2023-12-18 NOTE — Telephone Encounter (Signed)
 Ran benefits for Zilretta  left knee Case# [id number]

## 2023-12-18 NOTE — Telephone Encounter (Signed)
 Please check benefits for Zilretta  for left knee OA.

## 2023-12-21 NOTE — Telephone Encounter (Signed)
 Can you schedule patient when medication is stocked. Thank you   Zilretta  authorized for left knee NO PRE CERT REQUIRED Coinsurance 80% Copay $20 Deductible does not apply OOP MAX $3900 has met $48 Once OOP has been met coverage goes to 100% and copay will no longer apply Reference # [id number]

## 2023-12-22 DIAGNOSIS — Z1231 Encounter for screening mammogram for malignant neoplasm of breast: Secondary | ICD-10-CM | POA: Diagnosis not present

## 2023-12-22 LAB — HM MAMMOGRAPHY

## 2023-12-23 NOTE — Telephone Encounter (Signed)
 Scheduled 01/05/24

## 2023-12-26 ENCOUNTER — Other Ambulatory Visit: Payer: Self-pay | Admitting: Emergency Medicine

## 2023-12-26 DIAGNOSIS — E7849 Other hyperlipidemia: Secondary | ICD-10-CM

## 2023-12-28 ENCOUNTER — Ambulatory Visit: Payer: Self-pay | Admitting: Family Medicine

## 2023-12-28 NOTE — Progress Notes (Signed)
 Left knee x-ray shows arthritis and possibly a pothole of missing cartilage in the knee joint.

## 2023-12-29 ENCOUNTER — Encounter: Payer: Self-pay | Admitting: Emergency Medicine

## 2023-12-29 ENCOUNTER — Ambulatory Visit: Payer: Self-pay | Admitting: Emergency Medicine

## 2024-01-05 ENCOUNTER — Other Ambulatory Visit: Payer: Self-pay

## 2024-01-05 ENCOUNTER — Ambulatory Visit: Admitting: Family Medicine

## 2024-01-05 VITALS — BP 126/60 | HR 76 | Ht 67.0 in | Wt 238.0 lb

## 2024-01-05 DIAGNOSIS — M1712 Unilateral primary osteoarthritis, left knee: Secondary | ICD-10-CM

## 2024-01-05 MED ORDER — TRIAMCINOLONE ACETONIDE 32 MG IX SRER
32.0000 mg | Freq: Once | INTRA_ARTICULAR | Status: AC
  Administered 2024-01-05: 32 mg via INTRA_ARTICULAR

## 2024-01-05 NOTE — Progress Notes (Signed)
  Zilretta  injection left knee Procedure: Real-time Ultrasound Guided Injection of left knee joint superior lateral patellar space Device: Philips Affiniti 50G Images permanently stored and available for review in PACS Verbal informed consent obtained.  Discussed risks and benefits of procedure. Warned about infection, hyperglycemia bleeding, damage to structures among others. Patient expresses understanding and agreement Time-out conducted.   Noted no overlying erythema, induration, or other signs of local infection.   Skin prepped in a sterile fashion.   Local anesthesia: Topical Ethyl chloride.   With sterile technique and under real time ultrasound guidance: Zilretta  32 mg injected into knee joint. Fluid seen entering the joint capsule.   Completed without difficulty   Advised to call if fevers/chills, erythema, induration, drainage, or persistent bleeding.   Images permanently stored and available for review in the ultrasound unit.  Impression: Technically successful ultrasound guided injection.  Lot number: [id number]

## 2024-01-05 NOTE — Patient Instructions (Signed)
 Thank you for coming in today.   You received an injection today. Seek immediate medical attention if the joint becomes red, extremely painful, or is oozing fluid.

## 2024-01-08 DIAGNOSIS — Z961 Presence of intraocular lens: Secondary | ICD-10-CM | POA: Diagnosis not present

## 2024-01-08 DIAGNOSIS — H26492 Other secondary cataract, left eye: Secondary | ICD-10-CM | POA: Diagnosis not present

## 2024-01-08 DIAGNOSIS — I10 Essential (primary) hypertension: Secondary | ICD-10-CM | POA: Diagnosis not present

## 2024-01-08 DIAGNOSIS — H26493 Other secondary cataract, bilateral: Secondary | ICD-10-CM | POA: Diagnosis not present

## 2024-01-08 DIAGNOSIS — H35373 Puckering of macula, bilateral: Secondary | ICD-10-CM | POA: Diagnosis not present

## 2024-02-01 ENCOUNTER — Other Ambulatory Visit: Payer: Self-pay | Admitting: Emergency Medicine

## 2024-02-01 DIAGNOSIS — E039 Hypothyroidism, unspecified: Secondary | ICD-10-CM

## 2024-02-23 ENCOUNTER — Ambulatory Visit: Admitting: Emergency Medicine

## 2024-02-29 ENCOUNTER — Encounter: Payer: Self-pay | Admitting: Emergency Medicine

## 2024-02-29 ENCOUNTER — Ambulatory Visit: Admitting: Emergency Medicine

## 2024-02-29 VITALS — BP 140/100 | HR 61 | Temp 98.2°F | Ht 67.0 in | Wt 234.0 lb

## 2024-02-29 DIAGNOSIS — I152 Hypertension secondary to endocrine disorders: Secondary | ICD-10-CM

## 2024-02-29 DIAGNOSIS — E785 Hyperlipidemia, unspecified: Secondary | ICD-10-CM

## 2024-02-29 DIAGNOSIS — E1159 Type 2 diabetes mellitus with other circulatory complications: Secondary | ICD-10-CM

## 2024-02-29 DIAGNOSIS — N1831 Chronic kidney disease, stage 3a: Secondary | ICD-10-CM

## 2024-02-29 DIAGNOSIS — E1122 Type 2 diabetes mellitus with diabetic chronic kidney disease: Secondary | ICD-10-CM

## 2024-02-29 DIAGNOSIS — E039 Hypothyroidism, unspecified: Secondary | ICD-10-CM

## 2024-02-29 DIAGNOSIS — E66812 Obesity, class 2: Secondary | ICD-10-CM

## 2024-02-29 DIAGNOSIS — Z7985 Long-term (current) use of injectable non-insulin antidiabetic drugs: Secondary | ICD-10-CM | POA: Diagnosis not present

## 2024-02-29 DIAGNOSIS — M810 Age-related osteoporosis without current pathological fracture: Secondary | ICD-10-CM

## 2024-02-29 DIAGNOSIS — Z6839 Body mass index (BMI) 39.0-39.9, adult: Secondary | ICD-10-CM

## 2024-02-29 DIAGNOSIS — E1169 Type 2 diabetes mellitus with other specified complication: Secondary | ICD-10-CM

## 2024-02-29 LAB — POCT GLYCOSYLATED HEMOGLOBIN (HGB A1C): HbA1c POC (<> result, manual entry): 6 % (ref 4.0–5.6)

## 2024-02-29 MED ORDER — TIRZEPATIDE 2.5 MG/0.5ML ~~LOC~~ SOAJ: 2.5000 mg | SUBCUTANEOUS | 3 refills | Status: DC

## 2024-02-29 NOTE — Assessment & Plan Note (Signed)
 BP Readings from Last 3 Encounters:  02/29/24 (!) 140/100  01/05/24 126/60  12/18/23 126/60   Lab Results  Component Value Date   HGBA1C 6.0 02/29/2024  Elevated blood pressure reading in the office today Continue losartan  50 mg daily Advised to monitor blood pressure readings at home daily for the next couple of weeks and contact the office if numbers persistently abnormal. May need to increase dose of losartan  to 100 mg daily Well-controlled diabetes with hemoglobin A1c of 6.0 today Given her obesity I think she will benefit from GLP-1 agonist. Recommend to start with Mounjaro  Diet and nutrition discussed Follow-up in 6 months, earlier as needed

## 2024-02-29 NOTE — Patient Instructions (Signed)
 Health Maintenance After Age 72 After age 27, you are at a higher risk for certain long-term diseases and infections as well as injuries from falls. Falls are a major cause of broken bones and head injuries in people who are older than age 73. Getting regular preventive care can help to keep you healthy and well. Preventive care includes getting regular testing and making lifestyle changes as recommended by your health care provider. Talk with your health care provider about: Which screenings and tests you should have. A screening is a test that checks for a disease when you have no symptoms. A diet and exercise plan that is right for you. What should I know about screenings and tests to prevent falls? Screening and testing are the best ways to find a health problem early. Early diagnosis and treatment give you the best chance of managing medical conditions that are common after age 90. Certain conditions and lifestyle choices may make you more likely to have a fall. Your health care provider may recommend: Regular vision checks. Poor vision and conditions such as cataracts can make you more likely to have a fall. If you wear glasses, make sure to get your prescription updated if your vision changes. Medicine review. Work with your health care provider to regularly review all of the medicines you are taking, including over-the-counter medicines. Ask your health care provider about any side effects that may make you more likely to have a fall. Tell your health care provider if any medicines that you take make you feel dizzy or sleepy. Strength and balance checks. Your health care provider may recommend certain tests to check your strength and balance while standing, walking, or changing positions. Foot health exam. Foot pain and numbness, as well as not wearing proper footwear, can make you more likely to have a fall. Screenings, including: Osteoporosis screening. Osteoporosis is a condition that causes  the bones to get weaker and break more easily. Blood pressure screening. Blood pressure changes and medicines to control blood pressure can make you feel dizzy. Depression screening. You may be more likely to have a fall if you have a fear of falling, feel depressed, or feel unable to do activities that you used to do. Alcohol  use screening. Using too much alcohol  can affect your balance and may make you more likely to have a fall. Follow these instructions at home: Lifestyle Do not drink alcohol  if: Your health care provider tells you not to drink. If you drink alcohol : Limit how much you have to: 0-1 drink a day for women. 0-2 drinks a day for men. Know how much alcohol  is in your drink. In the [place] one drink equals one 12 oz bottle of beer (355 mL), one 5 oz glass of wine (148 mL), or one 1 oz glass of hard liquor (44 mL). Do not use any products that contain nicotine or tobacco. These products include cigarettes, chewing tobacco, and vaping devices, such as e-cigarettes. If you need help quitting, ask your health care provider. Activity  Follow a regular exercise program to stay fit. This will help you maintain your balance. Ask your health care provider what types of exercise are appropriate for you. If you need a cane or walker, use it as recommended by your health care provider. Wear supportive shoes that have nonskid soles. Safety  Remove any tripping hazards, such as rugs, cords, and clutter. Install safety equipment such as grab bars in bathrooms and safety rails on stairs. Keep rooms and walkways  well-lit. General instructions Talk with your health care provider about your risks for falling. Tell your health care provider if: You fall. Be sure to tell your health care provider about all falls, even ones that seem minor. You feel dizzy, tiredness (fatigue), or off-balance. Take over-the-counter and prescription medicines only as told by your health care provider. These include  supplements. Eat a healthy diet and maintain a healthy weight. A healthy diet includes low-fat dairy products, low-fat (lean) meats, and fiber from whole grains, beans, and lots of fruits and vegetables. Stay current with your vaccines. Schedule regular health, dental, and eye exams. Summary Having a healthy lifestyle and getting preventive care can help to protect your health and wellness after age 15. Screening and testing are the best way to find a health problem early and help you avoid having a fall. Early diagnosis and treatment give you the best chance for managing medical conditions that are more common for people who are older than age 42. Falls are a major cause of broken bones and head injuries in people who are older than age 64. Take precautions to prevent a fall at home. Work with your health care provider to learn what changes you can make to improve your health and wellness and to prevent falls. This information is not intended to replace advice given to you by your health care provider. Make sure you discuss any questions you have with your health care provider. Document Revised: 08/13/2020 Document Reviewed: 08/13/2020 Elsevier Patient Education  2024 [employer].

## 2024-02-29 NOTE — Progress Notes (Addendum)
 Janet Leblanc 73 y.o.   Chief Complaint  Patient presents with   Follow-up    6 months     HISTORY OF PRESENT ILLNESS: This is a 73 y.o. female here for 84-month follow-up of chronic medical conditions including hypertension and diabetes Overall doing well.  Has no complaints or any other medical concerns today. Lab Results  Component Value Date   HGBA1C 6.0 (A) 02/25/2023   BP Readings from Last 3 Encounters:  01/05/24 126/60  12/18/23 126/60  12/01/23 (!) 144/80   Wt Readings from Last 3 Encounters:  02/29/24 234 lb (106.1 kg)  01/05/24 238 lb (108 kg)  12/18/23 238 lb (108 kg)     HPI   Prior to Admission medications   Medication Sig Start Date End Date Taking? Authorizing Provider  albuterol  (VENTOLIN  HFA) 108 (90 Base) MCG/ACT inhaler Inhale 2 puffs into the lungs every 6 (six) hours as needed for wheezing or shortness of breath. 06/28/22   Allwardt, Alyssa M, PA-C  alendronate  (FOSAMAX ) 70 MG tablet TAKE 1 TABLET BY MOUTH ONCE WEEKLY ON AN EMPTY STOMACH BEFORE BREAKFAST. REMAIN UPRIGHT FOR 30 MINUTES AND TAKE WITH 8 OUNCES OF WATER 11/12/23   Junice Fei Jose, MD  aspirin 81 MG tablet Take 81 mg by mouth daily.    [provider]  Cholecalciferol (VITAMIN D3) 50 MCG (2000 UT) capsule Take 1 capsule (2,000 Units total) by mouth daily. 02/28/20   Just, Kelsea J, FNP  diclofenac  (VOLTAREN ) 75 MG EC tablet Take 1 tablet (75 mg total) by mouth 2 (two) times daily. 07/02/22   Oakleigh Hesketh Jose, MD  levothyroxine  (SYNTHROID ) 50 MCG tablet TAKE 1 TABLET BY MOUTH EVERY  OTHER DAY 02/02/24   Elimelech Houseman Jose, MD  losartan  (COZAAR ) 50 MG tablet TAKE 1 TABLET BY MOUTH DAILY 10/24/23   Loomis Anacker Jose, MD  Magnesium 500 MG CAPS Take 500 mg by mouth daily.    [provider]  Multiple Vitamin (MULTI VITAMIN DAILY PO) Take 1 tablet by mouth.    [provider]  omeprazole  (PRILOSEC) 20 MG capsule Take one capsule by mouth every other day.  10/23/23   Angellica Maddison Jose, MD  POTASSIUM PO Take 1 tablet by mouth as needed.    [provider]  rosuvastatin  (CRESTOR ) 10 MG tablet TAKE 1 TABLET BY MOUTH DAILY 12/27/23   Purcell Emil Schanz, MD  tirzepatide  (ZEPBOUND ) 2.5 MG/0.5ML injection vial Inject 2.5 mg into the skin once a week. 08/18/23   Purcell Emil Schanz, MD    Allergies  Allergen Reactions   Amoxicillin Rash   Penicillins Swelling    Patient Active Problem List   Diagnosis Date Noted   Loose skin 08/18/2023   Age-related osteoporosis without current pathological fracture 02/25/2023   Chronic pain of left knee 07/02/2022   Stage 3a chronic kidney disease (HCC) 11/20/2021   Dyslipidemia 08/20/2021   Prediabetes 02/14/2021   Chronic venous insufficiency 02/14/2021   Hypothyroidism 02/14/2021   Class 2 severe obesity due to excess calories with serious comorbidity and body mass index (BMI) of 39.0 to 39.9 in adult 02/14/2021   Type 2 diabetes mellitus with stage 3a chronic kidney disease, without long-term current use of insulin (HCC) 02/28/2020   Other hyperlipidemia 02/28/2020   Vitamin D deficiency 02/28/2020   Hypertension associated with diabetes (HCC) 03/24/2016    Past Medical History:  Diagnosis Date   Allergy    seasonal   Arthritis    Hypertension  Thyroid  disease    Vertigo     Past Surgical History:  Procedure Laterality Date   CHOLECYSTECTOMY     COLONOSCOPY  06/20/2019   FOOT SURGERY     GASTRIC BYPASS     15 years ago at least   TUBAL LIGATION      Social History   Socioeconomic History   Marital status: Married    Spouse name: Therron   Number of children: 2   Years of education: 12   Highest education level: 12th grade  Occupational History   Occupation: Multimedia Programmer   Tobacco Use   Smoking status: Former    Current packs/day: 0.00    Types: Cigarettes    Start date: 1975    Quit date: 1990    Years since quitting: 35.9   Smokeless tobacco: Never   Vaping Use   Vaping status: Never Used  Substance and Sexual Activity   Alcohol use: No    Alcohol/week: 0.0 standard drinks of alcohol   Drug use: No   Sexual activity: Not Currently    Birth control/protection: Post-menopausal  Other Topics Concern   Not on file  Social History Narrative   Not on file   Social Drivers of Health   Financial Resource Strain: Low Risk  (09/15/2023)   Overall Financial Resource Strain (CARDIA)    Difficulty of Paying Living Expenses: Not hard at all  Food Insecurity: No Food Insecurity (09/15/2023)   Hunger Vital Sign    Worried About Running Out of Food in the Last Year: Never true    Ran Out of Food in the Last Year: Never true  Transportation Needs: No Transportation Needs (09/15/2023)   PRAPARE - Administrator, Civil Service (Medical): No    Lack of Transportation (Non-Medical): No  Physical Activity: Sufficiently Active (09/15/2023)   Exercise Vital Sign    Days of Exercise per Week: 5 days    Minutes of Exercise per Session: 60 min  Recent Concern: Physical Activity - Inactive (09/08/2023)   Exercise Vital Sign    Days of Exercise per Week: 1 day    Minutes of Exercise per Session: 0 min  Stress: No Stress Concern Present (09/15/2023)   [employer] of Occupational Health - Occupational Stress Questionnaire    Feeling of Stress : Only a little  Social Connections: Moderately Isolated (09/15/2023)   Social Connection and Isolation Panel    Frequency of Communication with Friends and Family: More than three times a week    Frequency of Social Gatherings with Friends and Family: Once a week    Attends Religious Services: Never    Database Administrator or Organizations: No    Attends Engineer, Structural: Not on file    Marital Status: Married  Catering Manager Violence: Not At Risk (09/15/2023)   Humiliation, Afraid, Rape, and Kick questionnaire    Fear of Current or Ex-Partner: No    Emotionally Abused: No     Physically Abused: No    Sexually Abused: No    Family History  Problem Relation Age of Onset   Heart disease Father    Hypertension Father    Atrial fibrillation Sister    Heart disease Brother    Stroke Brother    Throat cancer Brother    Stroke Brother    Heart disease Paternal Grandmother    Cancer Paternal Grandfather    Migraines Neg Hx    Colon polyps Neg Hx  Colon cancer Neg Hx    Esophageal cancer Neg Hx    Rectal cancer Neg Hx    Stomach cancer Neg Hx      Review of Systems  Constitutional: Negative.  Negative for chills and fever.  HENT: Negative.  Negative for congestion and sore throat.   Respiratory: Negative.  Negative for cough and shortness of breath.   Cardiovascular: Negative.  Negative for chest pain and palpitations.  Gastrointestinal:  Negative for abdominal pain, nausea and vomiting.  Genitourinary: Negative.  Negative for dysuria and hematuria.  Skin: Negative.  Negative for rash.  Neurological: Negative.  Negative for dizziness and headaches.  All other systems reviewed and are negative.   Today's Vitals   02/29/24 1519  BP: (!) 140/100  Pulse: 61  Temp: 98.2 F (36.8 C)  TempSrc: Oral  SpO2: 97%  Weight: 234 lb (106.1 kg)  Height: 5' 7 (1.702 m)   Body mass index is 36.65 kg/m.   Physical Exam Vitals reviewed.  Constitutional:      Appearance: Normal appearance. She is obese.  HENT:     Head: Normocephalic.     Mouth/Throat:     Mouth: Mucous membranes are moist.     Pharynx: Oropharynx is clear.  Eyes:     Extraocular Movements: Extraocular movements intact.     Conjunctiva/sclera: Conjunctivae normal.     Pupils: Pupils are equal, round, and reactive to light.  Cardiovascular:     Rate and Rhythm: Normal rate and regular rhythm.     Pulses: Normal pulses.     Heart sounds: Normal heart sounds.  Pulmonary:     Effort: Pulmonary effort is normal.     Breath sounds: Normal breath sounds.  Musculoskeletal:     Cervical  back: No tenderness.  Lymphadenopathy:     Cervical: No cervical adenopathy.  Skin:    General: Skin is warm and dry.     Capillary Refill: Capillary refill takes less than 2 seconds.  Neurological:     General: No focal deficit present.     Mental Status: She is alert and oriented to person, place, and time.  Psychiatric:        Mood and Affect: Mood normal.        Behavior: Behavior normal.    Results for orders placed or performed in visit on 02/29/24 (from the past 24 hours)  POCT HgB A1C     Status: Abnormal   Collection Time: 02/29/24  3:33 PM  Result Value Ref Range   Hemoglobin A1C     HbA1c POC (<> result, manual entry) 6.0 4.0 - 5.6 %   HbA1c, POC (prediabetic range)     HbA1c, POC (controlled diabetic range)       ASSESSMENT & PLAN: A total of 42 minutes was spent with the patient and counseling/coordination of care regarding preparing for this visit, review of most recent office visit notes, review of multiple chronic medical conditions and their management, cardiovascular risks associated with diabetes and hypertension, review of all medications and changes made, review of most recent bloodwork results including interpretation of today's hemoglobin A1c, review of health maintenance items, education on nutrition, prognosis, documentation, and need for follow up.   Problem List Items Addressed This Visit       Cardiovascular and Mediastinum   Hypertension associated with diabetes (HCC) - Primary   BP Readings from Last 3 Encounters:  02/29/24 (!) 140/100  01/05/24 126/60  12/18/23 126/60   Lab Results  Component Value Date   HGBA1C 6.0 02/29/2024  Elevated blood pressure reading in the office today Continue losartan  50 mg daily Advised to monitor blood pressure readings at home daily for the next couple of weeks and contact the office if numbers persistently abnormal. May need to increase dose of losartan  to 100 mg daily Well-controlled diabetes with  hemoglobin A1c of 6.0 today Given her obesity I think she will benefit from GLP-1 agonist. Recommend to start with Mounjaro  Diet and nutrition discussed Follow-up in 6 months, earlier as needed       Relevant Medications   tirzepatide  (MOUNJARO ) 2.5 MG/0.5ML Pen   Other Relevant Orders   POCT HgB A1C (Completed)   CBC with Differential/Platelet   Comprehensive metabolic panel with GFR   Lipid panel     Endocrine   Dyslipidemia associated with type 2 diabetes mellitus (HCC)   Stable chronic conditions Continue rosuvastatin  10 mg daily Hemoglobin A1c of 6.0 Diet and nutrition discussed      Relevant Medications   tirzepatide  (MOUNJARO ) 2.5 MG/0.5ML Pen   Hypothyroidism   Lab Results  Component Value Date   TSH 2.78 08/25/2022  Recommend TSH today Continues Synthroid  50 mcg daily       Relevant Orders   TSH   Type 2 diabetes mellitus with stage 3a chronic kidney disease, without long-term current use of insulin (HCC)   Chronic stable condition Advised to stay well-hydrated and avoid NSAIDs      Relevant Medications   tirzepatide  (MOUNJARO ) 2.5 MG/0.5ML Pen     Musculoskeletal and Integument   Age-related osteoporosis without current pathological fracture   Tolerating Fosamax  70 mg weekly well. Side effects much improved.  No longer getting muscle cramping        Genitourinary   Stage 3a chronic kidney disease (HCC)   Chronic stable condition GFR improved to 59 Advised to stay well-hydrated and avoid NSAIDs        Other   Class 2 severe obesity due to excess calories with serious comorbidity and body mass index (BMI) of 39.0 to 39.9 in adult   Diet and nutrition discussed Advised to decrease amount of daily carbohydrate intake and daily calories and increase amount of plant-based protein in her diet Will benefit from GLP-1 agonists Willing to try it.  Recommend Mounjaro  weekly.  Choice of GLP-1 agonist depends on insurance coverage. New prescription sent  to pharmacy record today.      Relevant Medications   tirzepatide  (MOUNJARO ) 2.5 MG/0.5ML Pen     Patient Instructions  Health Maintenance After Age 33 After age 41, you are at a higher risk for certain long-term diseases and infections as well as injuries from falls. Falls are a major cause of broken bones and head injuries in people who are older than age 99. Getting regular preventive care can help to keep you healthy and well. Preventive care includes getting regular testing and making lifestyle changes as recommended by your health care provider. Talk with your health care provider about: Which screenings and tests you should have. A screening is a test that checks for a disease when you have no symptoms. A diet and exercise plan that is right for you. What should I know about screenings and tests to prevent falls? Screening and testing are the best ways to find a health problem early. Early diagnosis and treatment give you the best chance of managing medical conditions that are common after age 49. Certain conditions and lifestyle choices may make you  more likely to have a fall. Your health care provider may recommend: Regular vision checks. Poor vision and conditions such as cataracts can make you more likely to have a fall. If you wear glasses, make sure to get your prescription updated if your vision changes. Medicine review. Work with your health care provider to regularly review all of the medicines you are taking, including over-the-counter medicines. Ask your health care provider about any side effects that may make you more likely to have a fall. Tell your health care provider if any medicines that you take make you feel dizzy or sleepy. Strength and balance checks. Your health care provider may recommend certain tests to check your strength and balance while standing, walking, or changing positions. Foot health exam. Foot pain and numbness, as well as not wearing proper footwear, can  make you more likely to have a fall. Screenings, including: Osteoporosis screening. Osteoporosis is a condition that causes the bones to get weaker and break more easily. Blood pressure screening. Blood pressure changes and medicines to control blood pressure can make you feel dizzy. Depression screening. You may be more likely to have a fall if you have a fear of falling, feel depressed, or feel unable to do activities that you used to do. Alcohol use screening. Using too much alcohol can affect your balance and may make you more likely to have a fall. Follow these instructions at home: Lifestyle Do not drink alcohol if: Your health care provider tells you not to drink. If you drink alcohol: Limit how much you have to: 0-1 drink a day for women. 0-2 drinks a day for men. Know how much alcohol is in your drink. In the [place] one drink equals one 12 oz bottle of beer (355 mL), one 5 oz glass of wine (148 mL), or one 1 oz glass of hard liquor (44 mL). Do not use any products that contain nicotine or tobacco. These products include cigarettes, chewing tobacco, and vaping devices, such as e-cigarettes. If you need help quitting, ask your health care provider. Activity  Follow a regular exercise program to stay fit. This will help you maintain your balance. Ask your health care provider what types of exercise are appropriate for you. If you need a cane or walker, use it as recommended by your health care provider. Wear supportive shoes that have nonskid soles. Safety  Remove any tripping hazards, such as rugs, cords, and clutter. Install safety equipment such as grab bars in bathrooms and safety rails on stairs. Keep rooms and walkways well-lit. General instructions Talk with your health care provider about your risks for falling. Tell your health care provider if: You fall. Be sure to tell your health care provider about all falls, even ones that seem minor. You feel dizzy, tiredness  (fatigue), or off-balance. Take over-the-counter and prescription medicines only as told by your health care provider. These include supplements. Eat a healthy diet and maintain a healthy weight. A healthy diet includes low-fat dairy products, low-fat (lean) meats, and fiber from whole grains, beans, and lots of fruits and vegetables. Stay current with your vaccines. Schedule regular health, dental, and eye exams. Summary Having a healthy lifestyle and getting preventive care can help to protect your health and wellness after age 62. Screening and testing are the best way to find a health problem early and help you avoid having a fall. Early diagnosis and treatment give you the best chance for managing medical conditions that are more common for people  who are older than age 43. Falls are a major cause of broken bones and head injuries in people who are older than age 86. Take precautions to prevent a fall at home. Work with your health care provider to learn what changes you can make to improve your health and wellness and to prevent falls. This information is not intended to replace advice given to you by your health care provider. Make sure you discuss any questions you have with your health care provider. Document Revised: 08/13/2020 Document Reviewed: 08/13/2020 Elsevier Patient Education  2024 Elsevier Inc.     Emil Schaumann, MD [facility]

## 2024-02-29 NOTE — Assessment & Plan Note (Signed)
 Lab Results  Component Value Date   TSH 2.78 08/25/2022  Recommend TSH today Continues Synthroid  50 mcg daily

## 2024-02-29 NOTE — Assessment & Plan Note (Signed)
 Tolerating Fosamax  70 mg weekly well. Side effects much improved.  No longer getting muscle cramping

## 2024-02-29 NOTE — Assessment & Plan Note (Addendum)
 Stable chronic conditions Continue rosuvastatin  10 mg daily Hemoglobin A1c of 6.0 Diet and nutrition discussed

## 2024-02-29 NOTE — Assessment & Plan Note (Signed)
 Diet and nutrition discussed Advised to decrease amount of daily carbohydrate intake and daily calories and increase amount of plant-based protein in her diet Will benefit from GLP-1 agonists Willing to try it.  Recommend Mounjaro  weekly.  Choice of GLP-1 agonist depends on insurance coverage. New prescription sent to pharmacy record today.

## 2024-03-02 ENCOUNTER — Telehealth: Payer: Self-pay | Admitting: Pharmacist

## 2024-03-02 DIAGNOSIS — E1169 Type 2 diabetes mellitus with other specified complication: Secondary | ICD-10-CM

## 2024-03-02 NOTE — Progress Notes (Signed)
 Pharmacy Quality Measure Review  This patient is appearing on a report for being at risk of failing the Glycemic Status Assessment in Diabetes measure this calendar year.    Last documented A1c or GMI 6.0% on 02/29/24 however CPTII code was not documented.  Documented A1c <7% for quality care gaps.  Pt still needs MACR. Labs have been ordered but not collected.  Darrelyn Drum, PharmD, BCPS, CPP Clinical Pharmacist Practitioner [facility] [phone]

## 2024-03-09 DIAGNOSIS — N1831 Chronic kidney disease, stage 3a: Secondary | ICD-10-CM | POA: Insufficient documentation

## 2024-03-09 NOTE — Assessment & Plan Note (Signed)
 Chronic stable condition. Advised to stay well-hydrated and avoid NSAIDs

## 2024-03-09 NOTE — Assessment & Plan Note (Signed)
 Chronic stable condition GFR improved to 59 Advised to stay well-hydrated and avoid NSAIDs

## 2024-03-09 NOTE — Assessment & Plan Note (Deleted)
 Chronic stable condition GFR improved to 59 Advised to stay well-hydrated and avoid NSAIDs

## 2024-03-22 ENCOUNTER — Encounter: Payer: Self-pay | Admitting: *Deleted

## 2024-03-22 NOTE — Progress Notes (Signed)
 GABRYELA KIMBRELL                                          MRN: [id number]   03/22/2024   The [facility] Quality Team Specialist reviewed this patient medical record for the purposes of chart review for care gap closure. The following were reviewed: chart review for care gap closure-kidney health evaluation for diabetes:eGFR  and uACR.    [facility]I Quality Team

## 2024-03-22 NOTE — Progress Notes (Signed)
 Janet Leblanc                                          MRN: [id number]   03/22/2024   The [facility] Quality Team Specialist reviewed this patient medical record for the purposes of chart review for care gap closure. The following were reviewed: abstraction for care gap closure-glycemic status assessment.    [facility]I Quality Team

## 2024-03-28 ENCOUNTER — Ambulatory Visit: Admitting: Family Medicine

## 2024-03-28 ENCOUNTER — Other Ambulatory Visit: Payer: Self-pay

## 2024-03-28 VITALS — BP 186/88 | HR 74 | Ht 67.0 in

## 2024-03-28 DIAGNOSIS — M25562 Pain in left knee: Secondary | ICD-10-CM | POA: Diagnosis not present

## 2024-03-28 DIAGNOSIS — G8929 Other chronic pain: Secondary | ICD-10-CM | POA: Diagnosis not present

## 2024-03-28 NOTE — Progress Notes (Signed)
"       ° °  Janet Ileana Collet, PhD, LAT, ATC acting as a scribe for Artist Lloyd, MD.  Janet Leblanc is a 73 y.o. female who presents to [facility] today for cont'd L knee pain. Pt was last seen by Dr. Lloyd on 01/05/24 and her L knee was injected w/ Zilretta .  Today, pt reports L knee returned over the last several months. Pt locates pain to the posterior aspect of her L knee. She feels like her CSI lasted longer than the Zilretta  shot.  Pain is primarily posterior lateral and not posterior medial.  Dx imaging: 12/18/23 L knee XR 07/02/2022 L knee XR   Pertinent review of systems: No fevers or chills  Relevant historical information: History of a Baker's cyst   Exam:  BP (!) 186/88   Pulse 74   Ht 5' 7 (1.702 m)   SpO2 99%   BMI 36.65 kg/m  General: Well Developed, well nourished, and in no acute distress.   MSK: Left knee moderate effusion normal motion.    Lab and Radiology Results  Procedure: Real-time Ultrasound Guided Injection of left knee joint superior lateral patella space Device: Philips Affiniti 50G/GE Logiq Images permanently stored and available for review in PACS Ultrasound evaluation prior to injection does reveal a small joint effusion.  She does have a moderate Baker's cyst.  She is not tender palpation of Baker's cyst.  No abnormality visible at posterior lateral knee on ultrasound. Verbal informed consent obtained.  Discussed risks and benefits of procedure. Warned about infection, bleeding, hyperglycemia damage to structures among others. Patient expresses understanding and agreement Time-out conducted.   Noted no overlying erythema, induration, or other signs of local infection.   Skin prepped in a sterile fashion.   Local anesthesia: Topical Ethyl chloride.   With sterile technique and under real time ultrasound guidance: 40 mg of Kenalog  and 2 mL of Marcaine injected into knee joint. Fluid seen entering the joint capsule.    Completed without difficulty   Pain immediately resolved suggesting accurate placement of the medication.   Advised to call if fevers/chills, erythema, induration, drainage, or persistent bleeding.   Images permanently stored and available for review in the ultrasound unit.  Impression: Technically successful ultrasound guided injection.       Assessment and Plan: 73 y.o. 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  Plan for conventional steroid injection left knee.  If needed we could return for aspiration and injection of the Baker's cyst.   PDMP not reviewed this encounter. Orders Placed This Encounter  Procedures   US  LIMITED JOINT SPACE STRUCTURES LOW LEFT(NO LINKED CHARGES)    Reason for Exam (SYMPTOM  OR DIAGNOSIS REQUIRED):   left knee pain    Preferred imaging location?:   [facility]   No orders of the defined types were placed in this encounter.    Discussed warning signs or symptoms. Please see discharge instructions. Patient expresses understanding.   The above documentation has been reviewed and is accurate and complete Artist Lloyd, M.D.   "

## 2024-03-28 NOTE — Patient Instructions (Addendum)
 Thank you for coming in today.   You received an injection today. Seek immediate medical attention if the joint becomes red, extremely painful, or is oozing fluid.   Check back as needed  Happy Holidays!

## 2024-04-08 ENCOUNTER — Ambulatory Visit: Payer: Self-pay

## 2024-04-08 NOTE — Telephone Encounter (Signed)
 FYI Only or Action Required?: Action required by provider: clinical question for provider.  Patient was last seen in primary care on 02/29/2024 by Purcell Emil Schanz, MD.  Called Nurse Triage reporting Nasal Congestion.  Symptoms began a week ago.  Interventions attempted: OTC medications: ADVIL COLD AND SINUS, MUCINEX.  Symptoms are: gradually improving.  Triage Disposition: See PCP When Office is Open (Within 3 Days)  Patient/caregiver understands and will follow disposition?: Yes  Copied from CRM [id number]. Topic: Clinical - Red Word Triage >> Apr 08, 2024  8:51 AM Suzen RAMAN wrote: Red Word that prompted transfer to Nurse Triage: coughing and spitting up blood with mucous; requesting an appt Reason for Disposition  [1] Nasal discharge AND [2] present > 10 days  Answer Assessment - Initial Assessment Questions 1. LOCATION: Where does it hurt?      No pain at this time 2. ONSET: When did the sinus pain start?  (e.g., hours, days)      Ten days ago 3. SEVERITY: How bad is the pain?   (Scale 0-10; or none, mild, moderate or severe)     none 4. RECURRENT SYMPTOM: Have you ever had sinus problems before? If Yes, ask: When was the last time? and What happened that time?      Yes, normally runs it's course 5. NASAL CONGESTION: Is the nose blocked? If Yes, ask: Can you open it or must you breathe through your mouth?     Blocked and runny 6. NASAL DISCHARGE: Do you have discharge from your nose? If so ask, What color?     clear 7. FEVER: Do you have a fever? If Yes, ask: What is it, how was it measured, and when did it start?      denies 8. OTHER SYMPTOMS: Do you have any other symptoms? (e.g., sore throat, cough, earache, difficulty breathing)     Cough today that was pink tinged  Protocols used: Sinus Pain or Congestion-A-AH

## 2024-04-11 NOTE — Telephone Encounter (Signed)
 Please advise

## 2024-04-11 NOTE — Telephone Encounter (Signed)
 Needs office visit.  Can be seen by any available provider.  Thanks.

## 2024-04-12 NOTE — Telephone Encounter (Signed)
 Called patient and LVM.    1st attempt

## 2024-04-14 ENCOUNTER — Encounter: Payer: Self-pay | Admitting: Emergency Medicine

## 2024-04-14 ENCOUNTER — Ambulatory Visit: Admitting: Emergency Medicine

## 2024-04-14 VITALS — BP 122/84 | HR 134 | Temp 97.4°F | Ht 67.0 in | Wt 220.0 lb

## 2024-04-14 DIAGNOSIS — J069 Acute upper respiratory infection, unspecified: Secondary | ICD-10-CM | POA: Insufficient documentation

## 2024-04-14 DIAGNOSIS — R6889 Other general symptoms and signs: Secondary | ICD-10-CM | POA: Diagnosis not present

## 2024-04-14 MED ORDER — TIRZEPATIDE 5 MG/0.5ML ~~LOC~~ SOAJ: 5.0000 mg | SUBCUTANEOUS | 3 refills | Status: AC

## 2024-04-14 NOTE — Patient Instructions (Signed)

## 2024-04-14 NOTE — Assessment & Plan Note (Signed)
 Clinically stable and running its course without complications Symptom management discussed Advised to rest and stay well-hydrated Continue over-the-counter Mucinex DM Contact the office if no better or worse during the next several days

## 2024-04-14 NOTE — Assessment & Plan Note (Signed)
 Symptom management discussed Continue over-the-counter Mucinex DM Advised to rest and stay well-hydrated

## 2024-04-14 NOTE — Progress Notes (Signed)
 Janet Leblanc 74 y.o.   Chief Complaint  Patient presents with   Cough    Patient has extreme cough and mucous, was coughing blood on 1/2 but none since. Has been sick since 12/24     HISTORY OF PRESENT ILLNESS: This is a 74 y.o. female complaining of flulike symptoms that started on 03/30/2024.  Starting to get better. Last week noticed some blood in the phlegm.  Still has some lingering cough. No other associated symptoms No other complaints or medical concerns today Mounjaro  working well for her.  Losing weight.  Still on the 2.5 mg dose and tolerating it well.  Time to up the dose. Wt Readings from Last 3 Encounters:  04/14/24 220 lb (99.8 kg)  02/29/24 234 lb (106.1 kg)  01/05/24 238 lb (108 kg)     Cough Pertinent negatives include no chest pain, chills, fever, headaches or rash.     Prior to Admission medications  Medication Sig Start Date End Date Taking? Authorizing Provider  albuterol  (VENTOLIN  HFA) 108 (90 Base) MCG/ACT inhaler Inhale 2 puffs into the lungs every 6 (six) hours as needed for wheezing or shortness of breath. 06/28/22  Yes Allwardt, Alyssa M, PA-C  alendronate  (FOSAMAX ) 70 MG tablet TAKE 1 TABLET BY MOUTH ONCE WEEKLY ON AN EMPTY STOMACH BEFORE BREAKFAST. REMAIN UPRIGHT FOR 30 MINUTES AND TAKE WITH 8 OUNCES OF WATER 11/12/23  Yes Jontrell Bushong, Emil Schanz, MD  aspirin 81 MG tablet Take 81 mg by mouth daily.   Yes [provider]  Cholecalciferol (VITAMIN D3) 50 MCG (2000 UT) capsule Take 1 capsule (2,000 Units total) by mouth daily. 02/28/20  Yes Just, Kelsea J, FNP  diclofenac  (VOLTAREN ) 75 MG EC tablet Take 1 tablet (75 mg total) by mouth 2 (two) times daily. 07/02/22  Yes Shawniece Oyola, Emil Schanz, MD  levothyroxine  (SYNTHROID ) 50 MCG tablet TAKE 1 TABLET BY MOUTH EVERY  OTHER DAY 02/02/24  Yes Vernor Monnig Jose, MD  losartan  (COZAAR ) 50 MG tablet TAKE 1 TABLET BY MOUTH DAILY 10/24/23  Yes Azalya Galyon, Emil Schanz, MD  Magnesium 500 MG CAPS Take 500 mg by  mouth daily.   Yes [provider]  Multiple Vitamin (MULTI VITAMIN DAILY PO) Take 1 tablet by mouth.   Yes [provider]  omeprazole  (PRILOSEC) 20 MG capsule Take one capsule by mouth every other day. 10/23/23  Yes Aki Burdin Jose, MD  POTASSIUM PO Take 1 tablet by mouth as needed.   Yes [provider]  rosuvastatin  (CRESTOR ) 10 MG tablet TAKE 1 TABLET BY MOUTH DAILY 12/27/23  Yes Evella Kasal, Emil Schanz, MD  tirzepatide  (MOUNJARO ) 5 MG/0.5ML Pen Inject 5 mg into the skin once a week. 04/14/24  Yes Purcell Emil Schanz, MD    Allergies[1]  Patient Active Problem List   Diagnosis Date Noted   Type 2 diabetes mellitus with stage 3a chronic kidney disease, without long-term current use of insulin (HCC) 03/09/2024   Loose skin 08/18/2023   Age-related osteoporosis without current pathological fracture 02/25/2023   Chronic pain of left knee 07/02/2022   Stage 3a chronic kidney disease (HCC) 11/20/2021   Chronic venous insufficiency 02/14/2021   Hypothyroidism 02/14/2021   Class 2 severe obesity due to excess calories with serious comorbidity and body mass index (BMI) of 39.0 to 39.9 in adult 02/14/2021   Dyslipidemia associated with type 2 diabetes mellitus (HCC) 02/28/2020   Vitamin D deficiency 02/28/2020   Hypertension associated with diabetes (HCC) 03/24/2016    Past Medical History:  Diagnosis Date   Allergy    seasonal   Arthritis    Hypertension    Thyroid  disease    Vertigo     Past Surgical History:  Procedure Laterality Date   CHOLECYSTECTOMY     COLONOSCOPY  06/20/2019   FOOT SURGERY     GASTRIC BYPASS     15 years ago at least   TUBAL LIGATION      Social History   Socioeconomic History   Marital status: Married    Spouse name: Therron   Number of children: 2   Years of education: 12   Highest education level: 12th grade  Occupational History   Occupation: Multimedia Programmer   Tobacco Use   Smoking status: Former    Current  packs/day: 0.00    Types: Cigarettes    Start date: 1975    Quit date: 1990    Years since quitting: 36.0   Smokeless tobacco: Never  Vaping Use   Vaping status: Never Used  Substance and Sexual Activity   Alcohol use: No    Alcohol/week: 0.0 standard drinks of alcohol   Drug use: No   Sexual activity: Not Currently    Birth control/protection: Post-menopausal  Other Topics Concern   Not on file  Social History Narrative   Not on file   Social Drivers of Health   Tobacco Use: Medium Risk (04/14/2024)   Patient History    Smoking Tobacco Use: Former    Smokeless Tobacco Use: Never    Passive Exposure: Not on Actuary Strain: Low Risk (09/15/2023)   Overall Financial Resource Strain (CARDIA)    Difficulty of Paying Living Expenses: Not hard at all  Food Insecurity: No Food Insecurity (09/15/2023)   Hunger Vital Sign    Worried About Running Out of Food in the Last Year: Never true    Ran Out of Food in the Last Year: Never true  Transportation Needs: No Transportation Needs (09/15/2023)   PRAPARE - Administrator, Civil Service (Medical): No    Lack of Transportation (Non-Medical): No  Physical Activity: Sufficiently Active (09/15/2023)   Exercise Vital Sign    Days of Exercise per Week: 5 days    Minutes of Exercise per Session: 60 min  Recent Concern: Physical Activity - Inactive (09/08/2023)   Exercise Vital Sign    Days of Exercise per Week: 1 day    Minutes of Exercise per Session: 0 min  Stress: No Stress Concern Present (09/15/2023)   [employer] of Occupational Health - Occupational Stress Questionnaire    Feeling of Stress : Only a little  Social Connections: Moderately Isolated (09/15/2023)   Social Connection and Isolation Panel    Frequency of Communication with Friends and Family: More than three times a week    Frequency of Social Gatherings with Friends and Family: Once a week    Attends Religious Services: Never    Automotive Engineer or Organizations: No    Attends Engineer, Structural: Not on file    Marital Status: Married  Catering Manager Violence: Not At Risk (09/15/2023)   Humiliation, Afraid, Rape, and Kick questionnaire    Fear of Current or Ex-Partner: No    Emotionally Abused: No    Physically Abused: No    Sexually Abused: No  Depression (PHQ2-9): Low Risk (04/14/2024)   Depression (PHQ2-9)    PHQ-2 Score: 0  Alcohol Screen: Low Risk (09/15/2023)   Alcohol Screen  Last Alcohol Screening Score (AUDIT): 0  Housing: Low Risk (09/15/2023)   Housing Stability Vital Sign    Unable to Pay for Housing in the Last Year: No    Number of Times Moved in the Last Year: 0    Homeless in the Last Year: No  Utilities: Not At Risk (09/15/2023)   AHC Utilities    Threatened with loss of utilities: No  Health Literacy: Adequate Health Literacy (09/15/2023)   B1300 Health Literacy    Frequency of need for help with medical instructions: Never    Family History  Problem Relation Age of Onset   Heart disease Father    Hypertension Father    Atrial fibrillation Sister    Heart disease Brother    Stroke Brother    Throat cancer Brother    Stroke Brother    Heart disease Paternal Grandmother    Cancer Paternal Grandfather    Migraines Neg Hx    Colon polyps Neg Hx    Colon cancer Neg Hx    Esophageal cancer Neg Hx    Rectal cancer Neg Hx    Stomach cancer Neg Hx      Review of Systems  Constitutional: Negative.  Negative for chills and fever.  HENT:  Positive for congestion.   Respiratory:  Positive for cough.   Cardiovascular: Negative.  Negative for chest pain and palpitations.  Gastrointestinal:  Negative for abdominal pain, diarrhea, nausea and vomiting.  Genitourinary: Negative.  Negative for dysuria and hematuria.  Skin: Negative.  Negative for rash.  Neurological: Negative.  Negative for dizziness and headaches.    Vitals:   04/14/24 1427  BP: 122/84  Pulse: (!) 134   Temp: (!) 97.4 F (36.3 C)  SpO2: 96%    Physical Exam Vitals reviewed.  Constitutional:      Appearance: Normal appearance.  HENT:     Head: Normocephalic.     Mouth/Throat:     Mouth: Mucous membranes are moist.     Pharynx: Oropharynx is clear.  Eyes:     Extraocular Movements: Extraocular movements intact.     Conjunctiva/sclera: Conjunctivae normal.     Pupils: Pupils are equal, round, and reactive to light.  Cardiovascular:     Rate and Rhythm: Normal rate and regular rhythm.     Pulses: Normal pulses.     Heart sounds: Normal heart sounds.  Pulmonary:     Effort: Pulmonary effort is normal.     Breath sounds: Normal breath sounds.  Musculoskeletal:     Cervical back: No tenderness.  Lymphadenopathy:     Cervical: No cervical adenopathy.  Skin:    General: Skin is warm and dry.  Neurological:     General: No focal deficit present.     Mental Status: She is alert and oriented to person, place, and time.  Psychiatric:        Mood and Affect: Mood normal.        Behavior: Behavior normal.      ASSESSMENT & PLAN: Problem List Items Addressed This Visit       Respiratory   Upper respiratory infection, viral - Primary   Clinically stable and running its course without complications Symptom management discussed Advised to rest and stay well-hydrated Continue over-the-counter Mucinex DM Contact the office if no better or worse during the next several days        Other   Flu-like symptoms   Symptom management discussed Continue over-the-counter Mucinex DM Advised to rest and  stay well-hydrated      Patient Instructions  Viral Respiratory Infection A viral respiratory infection is an illness that affects parts of the body that are used for breathing. These include the lungs, nose, and throat. It is caused by a germ called a virus. Some examples of this kind of infection are: A cold. The flu (influenza). A respiratory syncytial virus (RSV)  infection. What are the causes? This condition is caused by a virus. It spreads from person to person. You can get the virus if: You breathe in droplets from someone who is sick. You come in contact with people who are sick. You touch mucus or other fluid from a person who is sick. What are the signs or symptoms? Symptoms of this condition include: A stuffy or runny nose. A sore throat. A cough. Shortness of breath. Trouble breathing. Yellow or green fluid in the nose. Other symptoms may include: A fever. Sweating or chills. Tiredness (fatigue). Achy muscles. A headache. How is this treated? This condition may be treated with: Medicines that treat viruses. Medicines that make it easy to breathe. Medicines that are sprayed into the nose. Acetaminophen  or NSAIDs, such as ibuprofen, to treat fever. Follow these instructions at home: Managing pain and congestion Take over-the-counter and prescription medicines only as told by your doctor. If you have a sore throat, gargle with salt water. Do this 3-4 times a day or as needed. To make salt water, dissolve -1 tsp (3-6 g) of salt in 1 cup (237 mL) of warm water. Make sure that all the salt dissolves. Use nose drops made from salt water. This helps with stuffiness (congestion). It also helps soften the skin around your nose. Take 2 tsp (10 mL) of honey at bedtime to lessen coughing at night. Do not give honey to children who are younger than 20 year old. Drink enough fluid to keep your pee (urine) pale yellow. General instructions  Rest as much as possible. Do not drink alcohol. Do not smoke or use any products that contain nicotine or tobacco. If you need help quitting, ask your doctor. Keep all follow-up visits. How is this prevented?     Get a flu shot every year. Ask your doctor when you should get your flu shot. Do not let other people get your germs. If you are sick: Wash your hands with soap and water often. Wash your  hands after you cough or sneeze. Wash hands for at least 20 seconds. If you cannot use soap and water, use hand sanitizer. Cover your mouth when you cough. Cover your nose and mouth when you sneeze. Do not share cups or eating utensils. Clean commonly used objects often. Clean commonly touched surfaces. Stay home from work or school. Avoid contact with people who are sick during cold and flu season. This is in fall and winter. Get help if: Your symptoms last for 10 days or longer. Your symptoms get worse over time. You have very bad pain in your face or forehead. Parts of your jaw or neck get very swollen. You have shortness of breath. Get help right away if: You feel pain or pressure in your chest. You have trouble breathing. You faint or feel like you will faint. You keep vomiting and it gets worse. You feel confused. These symptoms may be an emergency. Get help right away. Call your local emergency services (911 in the [place] Do not wait to see if the symptoms will go away. Do not drive yourself to the  hospital. Summary A viral respiratory infection is an illness that affects parts of the body that are used for breathing. Examples of this illness include a cold, the flu, and a respiratory syncytial virus (RSV) infection. The infection can cause a runny nose, cough, sore throat, and fever. Follow what your doctor tells you about taking medicines, drinking lots of fluid, washing your hands, resting at home, and avoiding people who are sick. This information is not intended to replace advice given to you by your health care provider. Make sure you discuss any questions you have with your health care provider. Document Revised: 06/28/2020 Document Reviewed: 06/28/2020 Elsevier Patient Education  2024 [employer]     Emil Schaumann, MD [facility]     [1]  Allergies Allergen Reactions   Amoxicillin Rash   Penicillins Swelling

## 2024-08-30 ENCOUNTER — Ambulatory Visit: Admitting: Emergency Medicine

## 2024-09-20 ENCOUNTER — Ambulatory Visit
